# Patient Record
Sex: Female | Born: 1965
Health system: Southern US, Community
[De-identification: ages and names within clinical notes are randomized; demographics above are authoritative.]

## PROBLEM LIST (undated history)

## (undated) DIAGNOSIS — E119 Type 2 diabetes mellitus without complications: Secondary | ICD-10-CM

## (undated) DIAGNOSIS — E559 Vitamin D deficiency, unspecified: Secondary | ICD-10-CM

## (undated) DIAGNOSIS — R011 Cardiac murmur, unspecified: Secondary | ICD-10-CM

## (undated) DIAGNOSIS — I10 Essential (primary) hypertension: Secondary | ICD-10-CM

## (undated) HISTORY — PX: ABLATION: SHX5711

## (undated) HISTORY — PX: COSMETIC SURGERY: SHX468

## (undated) HISTORY — DX: Type 2 diabetes mellitus without complications: E11.9

## (undated) HISTORY — PX: ASD REPAIR: SHX258

## (undated) HISTORY — DX: Vitamin D deficiency, unspecified: E55.9

## (undated) HISTORY — DX: Essential (primary) hypertension: I10

## (undated) HISTORY — PX: TUBAL LIGATION: SHX77

## (undated) HISTORY — DX: Cardiac murmur, unspecified: R01.1

---

## 1998-02-23 ENCOUNTER — Ambulatory Visit (HOSPITAL_COMMUNITY): Admission: RE | Admit: 1998-02-23 | Discharge: 1998-02-23 | Payer: Self-pay | Admitting: Obstetrics and Gynecology

## 1999-01-23 ENCOUNTER — Ambulatory Visit (HOSPITAL_COMMUNITY): Admission: RE | Admit: 1999-01-23 | Discharge: 1999-01-23 | Payer: Self-pay | Admitting: Obstetrics

## 2000-04-04 ENCOUNTER — Emergency Department (HOSPITAL_COMMUNITY): Admission: EM | Admit: 2000-04-04 | Discharge: 2000-04-04 | Payer: Self-pay

## 2000-04-17 ENCOUNTER — Ambulatory Visit (HOSPITAL_COMMUNITY): Admission: RE | Admit: 2000-04-17 | Discharge: 2000-04-17 | Payer: Self-pay | Admitting: Obstetrics and Gynecology

## 2000-04-17 ENCOUNTER — Encounter: Payer: Self-pay | Admitting: Obstetrics and Gynecology

## 2003-12-28 ENCOUNTER — Ambulatory Visit (HOSPITAL_COMMUNITY): Admission: RE | Admit: 2003-12-28 | Discharge: 2003-12-28 | Payer: Self-pay | Admitting: Obstetrics and Gynecology

## 2004-01-25 ENCOUNTER — Ambulatory Visit (HOSPITAL_BASED_OUTPATIENT_CLINIC_OR_DEPARTMENT_OTHER): Admission: RE | Admit: 2004-01-25 | Discharge: 2004-01-25 | Payer: Self-pay | Admitting: Obstetrics and Gynecology

## 2004-01-25 ENCOUNTER — Ambulatory Visit (HOSPITAL_COMMUNITY): Admission: RE | Admit: 2004-01-25 | Discharge: 2004-01-25 | Payer: Self-pay | Admitting: Obstetrics and Gynecology

## 2004-02-08 ENCOUNTER — Emergency Department (HOSPITAL_COMMUNITY): Admission: EM | Admit: 2004-02-08 | Discharge: 2004-02-08 | Payer: Self-pay | Admitting: Emergency Medicine

## 2006-06-06 ENCOUNTER — Other Ambulatory Visit: Admission: RE | Admit: 2006-06-06 | Discharge: 2006-06-06 | Payer: Self-pay | Admitting: Obstetrics and Gynecology

## 2006-10-07 ENCOUNTER — Encounter: Admission: RE | Admit: 2006-10-07 | Discharge: 2006-10-07 | Payer: Self-pay | Admitting: Obstetrics and Gynecology

## 2007-11-03 ENCOUNTER — Ambulatory Visit (HOSPITAL_COMMUNITY): Admission: RE | Admit: 2007-11-03 | Discharge: 2007-11-03 | Payer: Self-pay | Admitting: Obstetrics and Gynecology

## 2009-07-16 ENCOUNTER — Emergency Department (HOSPITAL_COMMUNITY): Admission: EM | Admit: 2009-07-16 | Discharge: 2009-07-16 | Payer: Self-pay | Admitting: Family Medicine

## 2009-07-16 ENCOUNTER — Emergency Department (HOSPITAL_COMMUNITY): Admission: EM | Admit: 2009-07-16 | Discharge: 2009-07-16 | Payer: Self-pay | Admitting: Emergency Medicine

## 2010-01-25 ENCOUNTER — Encounter: Admission: RE | Admit: 2010-01-25 | Discharge: 2010-01-25 | Payer: Self-pay | Admitting: Endocrinology

## 2010-09-23 ENCOUNTER — Encounter: Payer: Self-pay | Admitting: Obstetrics and Gynecology

## 2010-09-24 ENCOUNTER — Encounter: Payer: Self-pay | Admitting: Endocrinology

## 2010-12-06 LAB — POCT URINALYSIS DIP (DEVICE)
Glucose, UA: 500 mg/dL — AB
Specific Gravity, Urine: 1.01 (ref 1.005–1.030)
pH: 5.5 (ref 5.0–8.0)

## 2010-12-06 LAB — BASIC METABOLIC PANEL
BUN: 8 mg/dL (ref 6–23)
CO2: 27 mEq/L (ref 19–32)
Calcium: 8.8 mg/dL (ref 8.4–10.5)
Chloride: 107 mEq/L (ref 96–112)
Creatinine, Ser: 0.55 mg/dL (ref 0.4–1.2)

## 2010-12-06 LAB — URINE CULTURE

## 2011-01-19 NOTE — Op Note (Signed)
Maria Conley, SKORUPSKI                         ACCOUNT NO.:  [id number]   MEDICAL RECORD NO.:  [id number]                   PATIENT TYPE:  AMB   LOCATION:  [facility]                                 FACILITY:  [facility]   PHYSICIAN:  Katherine Roan, M.D.               DATE OF BIRTH:  08-09-1966   DATE OF PROCEDURE:  01/25/2004  DATE OF DISCHARGE:                                 OPERATIVE REPORT   PREOPERATIVE DIAGNOSES:  Menorrhagia with anemia.   POSTOPERATIVE DIAGNOSES:  Menorrhagia with anemia.   OPERATION:  Hysteroscopy with hydrothermal ablation.   DESCRIPTION OF PROCEDURE:  The patient was placed in lithotomy position.  Pelvic exam under anesthesia was accomplished.  Uterus was normal size in  the mid plane without adnexal masses, was freely mobile.  Cervix was then  grasped, dilated to a #21 [place], and the hysteroscope was inserted into the  uterus.  The endouterine cavity appeared to be quite normal.  There was 1  small polyp.  Then the hydrothermal ablation was done without difficulty.  Postop pictures were obtained.  No unusual blood loss occurred.  The patient  tolerated the procedure well, was sent to the recovery room in good  condition.                                               Katherine Roan, M.D.    SDM/MEDQ  D:  01/25/2004  T:  01/25/2004  Job:  [id number]

## 2012-11-07 ENCOUNTER — Institutional Professional Consult (permissible substitution): Payer: Self-pay | Admitting: Cardiology

## 2012-11-10 ENCOUNTER — Ambulatory Visit (INDEPENDENT_AMBULATORY_CARE_PROVIDER_SITE_OTHER): Payer: 59 | Admitting: Cardiology

## 2012-11-10 ENCOUNTER — Encounter: Payer: Self-pay | Admitting: Cardiology

## 2012-11-10 VITALS — BP 128/76 | HR 82 | Ht 65.0 in | Wt 197.6 lb

## 2012-11-10 DIAGNOSIS — Z9889 Other specified postprocedural states: Secondary | ICD-10-CM

## 2012-11-10 DIAGNOSIS — Z8774 Personal history of (corrected) congenital malformations of heart and circulatory system: Secondary | ICD-10-CM

## 2012-11-10 DIAGNOSIS — R079 Chest pain, unspecified: Secondary | ICD-10-CM

## 2012-11-10 DIAGNOSIS — E785 Hyperlipidemia, unspecified: Secondary | ICD-10-CM

## 2012-11-10 DIAGNOSIS — I119 Hypertensive heart disease without heart failure: Secondary | ICD-10-CM

## 2012-11-10 DIAGNOSIS — E109 Type 1 diabetes mellitus without complications: Secondary | ICD-10-CM

## 2012-11-10 DIAGNOSIS — E119 Type 2 diabetes mellitus without complications: Secondary | ICD-10-CM

## 2012-11-10 DIAGNOSIS — I10 Essential (primary) hypertension: Secondary | ICD-10-CM

## 2012-11-10 DIAGNOSIS — Z720 Tobacco use: Secondary | ICD-10-CM

## 2012-11-10 DIAGNOSIS — F17200 Nicotine dependence, unspecified, uncomplicated: Secondary | ICD-10-CM

## 2012-11-10 NOTE — Progress Notes (Addendum)
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  She is seen at the request of Paulene Floor NP of [facility]   family practice.  She is seen for evaluation of chest discomfort.  She has an interesting past medical history.  She was found to have a heart murmur on her kindergarten physical exam and it turned out to be an atrial septal defect which was repaired at age 32 at [facility].  At age 66 she became an insulin dependent type I diabetic.  She has been using an insulin pump since 1992 at age 29.  She has had good diabetic control over the years and is a former patient of Dr. Dagoberto Ligas and is now followed by Paulene Floor NP.  The patient states that her hemoglobin A1c runs about 7.  The patient has had a history of dyslipidemia and has been on statin therapy most recently Lipitor 40 mg daily.  The patient smokes cigarettes occasionally. She was in her usual state of health until several months ago when she began to note some intermittent chest pressure.  There was no pattern to it and it could occur at rest or with exertion.  At times she would notice radiation down the left arm which alarmed her.  She is a Engineer, civil (consulting).  The discomfort would normally pass on its own but she was also taken aspirin.  The patient has not had any symptoms of congestive heart failure.  She walks for exercise.  She had her last chest x-ray 01/25/10 which showed normal heart size and clear lungs and the old sternotomy incision from her ASD repair.  Current Outpatient Prescriptions  Medication Sig Dispense Refill  . aspirin 325 MG tablet Take 325 mg by mouth daily.      Marland Kitchen atorvastatin (LIPITOR) 40 MG tablet Take 40 mg by mouth daily.      . Cholecalciferol (VITAMIN D-3) 5000 UNITS TABS  Take by mouth.      . Insulin Human (INSULIN PUMP) 100 unit/ml SOLN Inject into the skin. Humalog basal 1.0 unit  Bolus 1.5 unit per 15 gram/carb      . lisinopril-hydrochlorothiazide (PRINZIDE,ZESTORETIC) 20-12.5 MG per tablet Take 1 tablet by mouth daily.       No current facility-administered medications for this visit.    No Known Allergies  Patient Active Problem List  Diagnosis  . Chest pain    History  Smoking status  . Current Some Day Smoker  Smokeless tobacco  . Not on file    History  Alcohol Use: Not on file    Family history reveals her father died of a heart attack in his 55s but had had 2 previous open heart operations for mitral stenosis secondary to childhood rheumatic fever.  Patient's mother has had coronary artery bypass graft surgery but is doing well and is alive at age 19.  Review of Systems: Constitutional: no fever chills diaphoresis or fatigue or change in weight.  Head and neck: no hearing loss, no epistaxis, no photophobia or visual disturbance. Respiratory: No cough, shortness of breath or wheezing. Cardiovascular: No  peripheral edema, palpitations.  Positive for chest pain Gastrointestinal: No abdominal distention, no abdominal pain, no change in bowel habits hematochezia or  melena. Genitourinary: No dysuria, no frequency, no urgency, no nocturia. Musculoskeletal:No arthralgias, no back pain, no gait disturbance or myalgias. Neurological: No dizziness, no headaches, no numbness, no seizures, no syncope, no weakness, no tremors. Hematologic: No lymphadenopathy, no easy bruising. Psychiatric: No confusion, no hallucinations, no sleep disturbance.    Physical Exam: Filed Vitals:   11/10/12 1553  BP: 128/76  Pulse: 82   the general appearance reveals a well-developed well-nourished woman in no distress.The head and neck exam reveals pupils equal and reactive.  Extraocular movements are full.  There is no scleral icterus.  The mouth and pharynx  are normal.  The neck is supple.  The carotids reveal no bruits.  The jugular venous pressure is normal.  The  thyroid is not enlarged.  There is no lymphadenopathy.  The chest is clear to percussion and auscultation.  There is a well-healed midline sternotomy scar.  There are no rales or rhonchi.  Expansion of the chest is symmetrical.  The precordium is quiet.  The first heart sound is normal.  The second heart sound is difficult to hear.  There is no  gallop rub or click.There is a grade2/6 systolic murmur along LSE.  There is no abnormal lift or heave.  The abdomen is soft and nontender.  The bowel sounds are normal.  The liver and spleen are not enlarged.  There are no abdominal masses.  There are no abdominal bruits.  Extremities reveal good pedal pulses.  There is no phlebitis or edema.  There is no cyanosis or clubbing.  Strength is normal and symmetrical in all extremities.  There is no lateralizing weakness.  There are no sensory deficits.  The skin is warm and dry.  There is no rash.  EKG shows normal sinus rhythm and is within normal limits   Assessment / Plan: Chest pain both resting and with exertion, concerning for possible myocardial ischemia.  There is occasional left arm radiation.  Patient has multiple risk factors including family history of CABG in her mother, sudden death in her father in his 25s, current tobacco usage, history of hypertension, history of type 1 diabetes on an insulin pump, and history of hypercholesterolemia.  She also has past history of open heart surgery with ASD repair at age 4 We will have the patient return for a treadmill Myoview stress test to evaluate her symptoms further.  In the meantime she will continue same medication including daily aspirin and we also gave her some nitroglycerin 0.4 mg tablets to have on hand for when necessary use.  Many thanks for the opportunity to see this pleasant woman with you.  I will be in touch with you regarding the results  of her treadmill Myoview stress test.

## 2012-11-10 NOTE — Patient Instructions (Signed)
Your physician has requested that you have en exercise stress myoview. For further information please visit [email or web address]. Please follow instruction sheet, as given.   Your physician recommends that you continue on your current medications as directed. Please refer to the Current Medication list given to you today. 

## 2012-11-12 ENCOUNTER — Other Ambulatory Visit: Payer: Self-pay | Admitting: *Deleted

## 2012-11-12 ENCOUNTER — Telehealth: Payer: Self-pay | Admitting: Cardiology

## 2012-11-12 MED ORDER — NITROGLYCERIN 0.4 MG SL SUBL: 0.4000 mg | SUBLINGUAL_TABLET | SUBLINGUAL | Status: DC | PRN

## 2012-11-12 NOTE — Telephone Encounter (Signed)
New Problem:    Patient called in because per her last OV Dr. Patty Sermons was supposed to call in a prescription of nitroglycerin but her pharmacy stats they have not received a request.  Please call back if you have any questions.

## 2012-11-12 NOTE — Telephone Encounter (Signed)
Ntg sent to pharmacy per pt request.

## 2012-11-18 ENCOUNTER — Encounter (HOSPITAL_COMMUNITY): Payer: 59

## 2012-11-18 ENCOUNTER — Ambulatory Visit (HOSPITAL_COMMUNITY): Payer: 59 | Attending: Cardiology | Admitting: Radiology

## 2012-11-18 VITALS — BP 121/68 | Ht 65.0 in | Wt 196.0 lb

## 2012-11-18 DIAGNOSIS — R079 Chest pain, unspecified: Secondary | ICD-10-CM

## 2012-11-18 DIAGNOSIS — R0602 Shortness of breath: Secondary | ICD-10-CM

## 2012-11-18 DIAGNOSIS — R0789 Other chest pain: Secondary | ICD-10-CM | POA: Insufficient documentation

## 2012-11-18 DIAGNOSIS — E119 Type 2 diabetes mellitus without complications: Secondary | ICD-10-CM | POA: Insufficient documentation

## 2012-11-18 DIAGNOSIS — R002 Palpitations: Secondary | ICD-10-CM | POA: Insufficient documentation

## 2012-11-18 DIAGNOSIS — F17200 Nicotine dependence, unspecified, uncomplicated: Secondary | ICD-10-CM | POA: Insufficient documentation

## 2012-11-18 DIAGNOSIS — I10 Essential (primary) hypertension: Secondary | ICD-10-CM | POA: Insufficient documentation

## 2012-11-18 MED ORDER — TECHNETIUM TC 99M SESTAMIBI GENERIC - CARDIOLITE
10.0000 | Freq: Once | INTRAVENOUS | Status: AC | PRN
  Administered 2012-11-18: 10 via INTRAVENOUS

## 2012-11-18 MED ORDER — REGADENOSON 0.4 MG/5ML IV SOLN
0.4000 mg | Freq: Once | INTRAVENOUS | Status: AC
  Administered 2012-11-18: 0.4 mg via INTRAVENOUS

## 2012-11-18 MED ORDER — TECHNETIUM TC 99M SESTAMIBI GENERIC - CARDIOLITE
30.0000 | Freq: Once | INTRAVENOUS | Status: AC | PRN
  Administered 2012-11-18: 30 via INTRAVENOUS

## 2012-11-18 NOTE — Progress Notes (Signed)
  [facility] SITE 3 NUCLEAR MED [address] [place], [place] [address] [phone]    Cardiology Nuclear Med Study  Maria Conley is a 47 y.o. female     MRN : [id number]     DOB: 1966/09/03  Procedure Date: 11/18/2012  Nuclear Med Background Indication for Stress Test:  Evaluation for Ischemia History:  1997 ASD repair at [facility] Cardiac Risk Factors: Family History - CAD, Hypertension, IDDM Type 1, Lipids and Smoker  Symptoms:  Chest Pain/Pressure at Rest and with Exertion (last date of chest discomfort 2 days ago), Fatigue and Palpitations   Nuclear Pre-Procedure Caffeine/Decaff Intake:  None NPO After: 8:00am   Lungs:  clear IV 0.9% NS with Angio Cath:  20g  IV Site: R Hand  IV Started by:  Cathlyn Parsons, RN  Chest Size (in):  36 Cup Size: C  Height: 5\' 5"  (1.651 m)  Weight:  196 lb (88.905 kg)  BMI:  Body mass index is 32.62 kg/(m^2). Tech Comments: n/a    Nuclear Med Study 1 or 2 day study: 1 day  Stress Test Type:  Treadmill/Lexiscan  Reading MD: Cassell Clement, MD  Order Authorizing Provider:  Villa Herb  Resting Radionuclide: Technetium 99m Sestamibi  Resting Radionuclide Dose: 11.0 mCi   Stress Radionuclide:  Technetium 52m Sestamibi  Stress Radionuclide Dose: 33.0 mCi           Stress Protocol Rest HR: 80 Stress HR: 148  Rest BP: 121/68 Stress BP: 164/76  Exercise Time (min): 7:31 METS: 7.00   Predicted Max HR: 172 bpm % Max HR: 86.05 bpm Rate Pressure Product: 21308   Dose of Adenosine (mg):  n/a Dose of Lexiscan: 0.4 mg  Dose of Atropine (mg): n/a Dose of Dobutamine: n/a mcg/kg/min (at max HR)  Stress Test Technologist: Cathlyn Parsons, RN  Nuclear Technologist:  Domenic Polite, CNMT     Rest Procedure:  Myocardial perfusion imaging was performed at rest 45 minutes following the intravenous administration of Technetium 29m Sestamibi. Rest ECG: NSR - Normal EKG  Stress Procedure:  The patient exercised on the  treadmill utilizing the Bruce Protocol for 5:30 minutes. The patient converted to Lexiscan due to severe SOB .  Patient received IV Lexiscan 0.4 mg over 15 seconds with low level exercise.Technetium 51m Sestamibi was injected at 30 seconds. Treadmill stopped at 6:30 due to severe dyspnea. Patient had no chest pain with infusion. Myocardial perfusion imaging was performed after a 45 minute delay. Stress ECG: No significant change from baseline ECG  QPS Raw Data Images:  Normal; no motion artifact; normal heart/lung ratio. Stress Images:  Normal homogeneous uptake in all areas of the myocardium. Rest Images:  Normal homogeneous uptake in all areas of the myocardium. Subtraction (SDS):  No evidence of ischemia. Transient Ischemic Dilatation (Normal <1.22):  0.94 Lung/Heart Ratio (Normal <0.45):  0.33  Quantitative Gated Spect Images QGS EDV:  76 ml QGS ESV:  15 ml  Impression Exercise Capacity:  Lexiscan with low level exercise. BP Response:  Normal blood pressure response. Clinical Symptoms:  No chest pain. ECG Impression:  No significant ST segment change suggestive of ischemia. Comparison with Prior Nuclear Study: No images to compare  Overall Impression:  Normal stress nuclear study.  LV Ejection Fraction: 80%.  LV Wall Motion:  NL LV Function; NL Wall Motion  [employer]

## 2012-11-21 ENCOUNTER — Other Ambulatory Visit: Payer: Self-pay | Admitting: Nurse Practitioner

## 2012-11-24 ENCOUNTER — Other Ambulatory Visit (HOSPITAL_COMMUNITY): Payer: Self-pay | Admitting: Obstetrics and Gynecology

## 2012-11-24 ENCOUNTER — Telehealth: Payer: Self-pay | Admitting: Cardiology

## 2012-11-24 DIAGNOSIS — Z1231 Encounter for screening mammogram for malignant neoplasm of breast: Secondary | ICD-10-CM

## 2012-11-24 NOTE — Telephone Encounter (Signed)
New Prob   Pt calling in returning phone call from last week.

## 2012-11-24 NOTE — Telephone Encounter (Signed)
Message copied by Burnell Blanks on Mon Nov 24, 2012  8:56 AM ------      Message from: Cassell Clement      Created: Wed Nov 19, 2012 12:31 PM       Please report.  The stress test was normal.  No evidence of ischemia.  Ejection fraction was normal at 80%. ------

## 2012-11-24 NOTE — Telephone Encounter (Signed)
Advised patient of results.  

## 2012-11-27 ENCOUNTER — Ambulatory Visit (HOSPITAL_COMMUNITY)
Admission: RE | Admit: 2012-11-27 | Discharge: 2012-11-27 | Disposition: A | Discharge status: Home / Self Care | Payer: 59 | Source: Ambulatory Visit | Attending: Obstetrics and Gynecology | Admitting: Obstetrics and Gynecology

## 2012-11-27 DIAGNOSIS — Z1231 Encounter for screening mammogram for malignant neoplasm of breast: Secondary | ICD-10-CM | POA: Insufficient documentation

## 2013-01-01 ENCOUNTER — Other Ambulatory Visit: Payer: Self-pay | Admitting: Nurse Practitioner

## 2013-01-21 ENCOUNTER — Encounter: Payer: Self-pay | Admitting: Family Medicine

## 2013-01-21 ENCOUNTER — Ambulatory Visit (INDEPENDENT_AMBULATORY_CARE_PROVIDER_SITE_OTHER): Payer: 59 | Admitting: Family Medicine

## 2013-01-21 VITALS — BP 120/76 | HR 92 | Temp 99.7°F | Ht 65.0 in | Wt 191.0 lb

## 2013-01-21 DIAGNOSIS — M549 Dorsalgia, unspecified: Secondary | ICD-10-CM

## 2013-01-21 MED ORDER — KETOROLAC TROMETHAMINE 60 MG/2ML IM SOLN
30.0000 mg | Freq: Once | INTRAMUSCULAR | Status: AC
  Administered 2013-01-21: 30 mg via INTRAMUSCULAR

## 2013-01-21 MED ORDER — PREDNISONE 50 MG PO TABS: ORAL_TABLET | ORAL | Status: DC

## 2013-01-21 MED ORDER — CYCLOBENZAPRINE HCL 5 MG PO TABS: 5.0000 mg | ORAL_TABLET | Freq: Three times a day (TID) | ORAL | Status: DC | PRN

## 2013-01-21 MED ORDER — MELOXICAM 15 MG PO TABS: 15.0000 mg | ORAL_TABLET | Freq: Every day | ORAL | Status: DC

## 2013-01-21 NOTE — Progress Notes (Signed)
  Subjective:    Patient ID: Maria Conley, female    DOB: 10-09-1965, 47 y.o.   MRN: [id number]  HPI Back pain x 2-3 weeks Has had episodes of back spasms in the past.  Has had formal work up including CTs to r/o kidney stones that have been negative.  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  Initially noticed LBP after working Has persisted since this point. Predominantly R sided.  No radiation/radicular sxs.  No dysuria.  Pain worse with flexion/extension and prolonged standing.  Baseline type I diabetic with insulin pump.  Blood sugars well controlled.  Can uptitrate regimen if necessary. Has done this in the past with prednisone use.    Review of Systems  All other systems reviewed and are negative.       Objective:   Physical Exam  Constitutional: She appears well-developed and well-nourished.  HENT:  Head: Normocephalic.  Eyes: Conjunctivae are normal. Pupils are equal, round, and reactive to light.  Neck: Normal range of motion.  Cardiovascular: Normal rate, regular rhythm and normal heart sounds.   Pulmonary/Chest: Effort normal.  Abdominal: Soft.  Musculoskeletal:       Arms: Neurovascularly intact distally    Neurological: She is alert.  Skin: Skin is warm.          Assessment & Plan:  Toradol 30mg  IM x 1  Will place on course of prednisone, mobic and flexeril  Discussed GI, blood sugar, blood pressure, renal  red flags.  Physical therapy referral as this has become a recurrent issue.  Follow up as needed.     The patient and/or caregiver has been counseled thoroughly with regard to treatment plan and/or medications prescribed including dosage, schedule, interactions, rationale for use, and possible side effects and they verbalize understanding. Diagnoses and expected course of recovery discussed and will return if not improved as expected or if the condition worsens. Patient and/or caregiver verbalized understanding.

## 2013-01-21 NOTE — Patient Instructions (Addendum)
Ketorolac injection What is this medicine? KETOROLAC (kee toe ROLE ak) is a non-steroidal anti-inflammatory drug (NSAID). It is used to treat moderate to severe pain for up to 5 days. It is commonly used after surgery. This medicine should not be used for more than 5 days. This medicine may be used for other purposes; ask your health care provider or pharmacist if you have questions. What should I tell my health care provider before I take this medicine? They need to know if you have any of these conditions: -asthma, especially aspirin-sensitive asthma -bleeding problems -kidney disease -stomach bleed, ulcer, or other problem -taking aspirin, other NSAID, or probenecid -an unusual or allergic reaction to ketorolac, tromethamine, aspirin, other NSAIDs, other medicines, foods, dyes or preservatives -pregnant or trying to get pregnant -breast-feeding How should I use this medicine? This medicine is for injection into a muscle or into a vein. It is given by a health care professional in a hospital or clinic setting. Talk to your pediatrician regarding the use of this medicine in children. While this drug may be prescribed for children as young as 2 years old for selected conditions, precautions do apply. Patients over 65 years old may have a stronger reaction and need a smaller dose. Overdosage: If you think you have taken too much of this medicine contact a poison control center or emergency room at once. NOTE: This medicine is only for you. Do not share this medicine with others. What if I miss a dose? This does not apply. What may interact with this medicine? Do not take this medicine with any of the following medications: -aspirin and aspirin-like medicines -cidofovir -methotrexate -NSAIDs, medicines for pain and inflammation, like ibuprofen or naproxen -pentoxifylline -probenecid This medicine may also interact with the following  medications: -alcohol -alendronate -alprazolam -carbamazepine -diuretics -flavocoxid -fluoxetine -ginkgo -lithium -medicines for blood pressure like enalapril -medicines that affect platelets like pentoxifylline -medicines that treat or prevent blood clots like heparin, warfarin -muscle relaxants -pemetrexed -phenytoin -thiothixene This list may not describe all possible interactions. Give your health care provider a list of all the medicines, herbs, non-prescription drugs, or dietary supplements you use. Also tell them if you smoke, drink alcohol, or use illegal drugs. Some items may interact with your medicine. What should I watch for while using this medicine? Tell your doctor or healthcare professional if your symptoms do not start to get better or if they get worse. This medicine does not prevent heart attack or stroke. In fact, this medicine may increase the chance of a heart attack or stroke. The chance may increase with longer use of this medicine and in people who have heart disease. If you take aspirin to prevent heart attack or stroke, talk with your doctor or health care professional. Do not take medicines such as ibuprofen and naproxen with this medicine. Side effects such as stomach upset, nausea, or ulcers may be more likely to occur. Many medicines available without a prescription should not be taken with this medicine. This medicine can cause ulcers and bleeding in the stomach and intestines at any time during treatment. Do not smoke cigarettes or drink alcohol. These increase irritation to your stomach and can make it more susceptible to damage from this medicine. Ulcers and bleeding can happen without warning symptoms and can cause death. This medicine can cause you to bleed more easily. Try to avoid damage to your teeth and gums when you brush or floss your teeth. What side effects may I notice from receiving   this medicine? Side effects that you should report to your  doctor or health care professional as soon as possible: -allergic reactions like skin rash, itching or hives, swelling of the face, lips, or tongue -black or tarry stools -breathing problems -changes in vision -chest pain -high blood pressure -nausea, vomiting -redness, blistering, peeling or loosening of the skin, including inside the mouth -severe abdominal pain -slurred speech or weakness on one side of the body -trouble passing urine or change in the amount of urine -unexplained weight gain or swelling -unusual bleeding or bruising -unusually weak or tired -yellowing of eyes or skin Side effects that usually do not require medical attention (report to your doctor or health care professional if they continue or are bothersome): -diarrhea -dizziness -headache -heartburn This list may not describe all possible side effects. Call your doctor for medical advice about side effects. You may report side effects to FDA at 1-800-FDA-1088. Where should I keep my medicine? This drug is given in a hospital or clinic and will not be stored at home. NOTE: This sheet is a summary. It may not cover all possible information. If you have questions about this medicine, talk to your doctor, pharmacist, or health care provider.  2012, Elsevier/Gold Standard. (01/08/2008 5:24:50 PM)

## 2013-02-03 ENCOUNTER — Ambulatory Visit: Payer: 59 | Admitting: Physical Therapy

## 2013-02-04 ENCOUNTER — Telehealth: Payer: Self-pay | Admitting: Family Medicine

## 2013-02-04 ENCOUNTER — Telehealth: Payer: Self-pay | Admitting: Nurse Practitioner

## 2013-02-04 MED ORDER — "PARADIGM QUICK-SET 23"" 6MM MISC": Status: AC

## 2013-02-04 NOTE — Telephone Encounter (Signed)
Need to know what size resevoir she uses and length of tubing

## 2013-02-04 NOTE — Telephone Encounter (Signed)
Called in infusion set to [facility] and patient called.  She is made aware that she needs appt for f/u diabetes.  At time that I called patient was unable to make appt because she was at work . She said she would call back for appt.

## 2013-02-05 NOTE — Telephone Encounter (Signed)
Done by nurse 

## 2013-02-05 NOTE — Telephone Encounter (Signed)
DONE

## 2013-02-05 NOTE — Telephone Encounter (Signed)
SENDS TO [facility]-  23 INCHES  OF TUBING NO RES. NEEDED

## 2013-02-06 ENCOUNTER — Telehealth: Payer: Self-pay | Admitting: *Deleted

## 2013-02-06 NOTE — Telephone Encounter (Signed)
Need to know pump brand and what she needs

## 2013-02-06 NOTE — Telephone Encounter (Signed)
Patient needs Diabetic pump supplies. Maria Conley needs a rx sent for Diabetic pump supplies. Please send.

## 2013-02-09 NOTE — Telephone Encounter (Signed)
This was taken care of 6/4

## 2013-02-25 ENCOUNTER — Telehealth: Payer: Self-pay | Admitting: Pharmacist

## 2013-02-25 NOTE — Telephone Encounter (Signed)
We called in rx for tubing to [facility] pharmacy but they do not accept patient's flex spending plan.   Left patient some samples of pump tubing to use until she gets appt with [facility].  She should then be able to get pump supplies through [facility] at no charge.

## 2013-03-11 ENCOUNTER — Other Ambulatory Visit: Payer: Self-pay | Admitting: *Deleted

## 2013-03-11 MED ORDER — ATORVASTATIN CALCIUM 40 MG PO TABS: 40.0000 mg | ORAL_TABLET | Freq: Every day | ORAL | Status: DC

## 2013-03-16 ENCOUNTER — Ambulatory Visit: Payer: 59

## 2013-03-30 ENCOUNTER — Ambulatory Visit (INDEPENDENT_AMBULATORY_CARE_PROVIDER_SITE_OTHER): Payer: 59 | Admitting: Pharmacist

## 2013-03-30 ENCOUNTER — Encounter: Payer: Self-pay | Admitting: Pharmacist

## 2013-03-30 VITALS — BP 122/78 | HR 74 | Ht 66.5 in | Wt 196.0 lb

## 2013-03-30 DIAGNOSIS — E109 Type 1 diabetes mellitus without complications: Secondary | ICD-10-CM

## 2013-03-30 DIAGNOSIS — I119 Hypertensive heart disease without heart failure: Secondary | ICD-10-CM

## 2013-03-30 NOTE — Progress Notes (Signed)
Diabetes Follow-Up Visit Chief Complaint:   Chief Complaint  Patient presents with  . Diabetes     Filed Vitals:   03/30/13 2108  BP: 122/78  Pulse: 74     HPI: Maria Conley has type 1 diabetes managed with insulin pump therapy.  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  She had her initial consult July 14th.  At that visit an A1c was check and per patient was 6.5% but I currently am unable to access those records.  She is scheduled for her nutrition class in August and after that visit will be able to get pump supplies, glucometer supplies and medications at no cost.   She does report that she does not have enough infusion sets to last until her August visit.  She also does not have any test strips to check BG.   Current Diabetes Medications:  Humalog insulin used in insulin pump  Exam Edema:  negative Polyuria:  negative  Polydipsia:  negative Polyphagia:  negative  BMI:  Body mass index is 31.16 kg/(m^2).   Weight changes:  stable General Appearance:  alert, oriented, no acute distress Mood/Affect:  normal   Low fat/carbohydrate diet?  Yes Nicotine Abuse?  No Medication Compliance?  Yes Exercise?  No Alcohol Abuse?  No  Home BG Monitoring:  Checking 0 times a day. Denies hypoglycemic or hyperglycemic events     Assessment: 1.  Diabetes.  Controlled but needs supplies 2.  Blood Pressure.  controlled   Recommendations: 1.  Medication recommendations at this time are as follows:  No medication changes recommended  I did give patient #10 infusion sets to get her though until [facility] appt 2.  Reviewed HBG goals:  Fasting 80-130 and 1-2 hour post prandial <180.  Patient is instructed to check BG up to 4 times per day.     Gave One Touch Verio glucometer and #30 test strips 3.  BP goal < 140/80. 4.  LDL goal of < 100, HDL > 40 and  TG < 150 - labs needed at next appt    Time spent counseling patient:  25 minutes

## 2013-04-13 ENCOUNTER — Other Ambulatory Visit: Payer: Self-pay | Admitting: *Deleted

## 2013-04-13 MED ORDER — LISINOPRIL-HYDROCHLOROTHIAZIDE 20-12.5 MG PO TABS: 1.0000 | ORAL_TABLET | Freq: Every day | ORAL | Status: DC

## 2013-04-15 ENCOUNTER — Ambulatory Visit: Payer: 59 | Admitting: *Deleted

## 2013-05-13 ENCOUNTER — Other Ambulatory Visit: Payer: Self-pay | Admitting: Nurse Practitioner

## 2013-05-14 ENCOUNTER — Other Ambulatory Visit: Payer: Self-pay | Admitting: Family Medicine

## 2013-05-14 ENCOUNTER — Telehealth: Payer: Self-pay | Admitting: Pharmacist

## 2013-05-14 ENCOUNTER — Encounter: Payer: Self-pay | Admitting: *Deleted

## 2013-05-14 ENCOUNTER — Encounter: Payer: 59 | Attending: Family Medicine | Admitting: *Deleted

## 2013-05-14 VITALS — Ht 65.0 in | Wt 195.1 lb

## 2013-05-14 DIAGNOSIS — E109 Type 1 diabetes mellitus without complications: Secondary | ICD-10-CM | POA: Insufficient documentation

## 2013-05-14 DIAGNOSIS — Z713 Dietary counseling and surveillance: Secondary | ICD-10-CM | POA: Insufficient documentation

## 2013-05-14 MED ORDER — INSULIN LISPRO 100 UNIT/ML ~~LOC~~ SOLN: SUBCUTANEOUS | Status: DC

## 2013-05-14 NOTE — Progress Notes (Signed)
  Pump Progress Note  Appointment Start Time: 0800 End Time: 0930  Assessment: Patient here as [facility] patient for pump use review and diabetes education as needed. She is currently using a Medtronic insuiln pump that is out of warranty, but would like to wait until January 2015 to pursue newer pump when benefits start over. She does not know how interested she would be in sensor technology. She states she has hypoglycemia about twice a week, and her current A1c is 6.5%. She does not typically use the Bolus Wizard, but does bolus for food and correction BG manually. She states she is familiar with Temp Basal feature and uses it if she needs to fast for blood work or other reasons her meals might be delayed.   Intervention: Acknowledged her good control of diabetes with excellent A1c of 6.5% with infrequent low BG reported. Discussed rationale of using Bolus Wizard as a more objective way to provide accurate insulin for meals and for corrections. Reviewed Carb Counting in terms of food groups compared to counting grams and the use of Exchange on the pump to relate to Carb Choice of 15 grams. Also discussed plan for [facility] to start a DM1 Support Group in the near future which she may find beneficial.  Plan:  Consider greater use of Bolus Wizard vs. Manual Boluses. Continue with good control of diabetes, great job!  Follow up: Patient is doing well now. She may call for follow up visit as needed in the future.

## 2013-05-14 NOTE — Telephone Encounter (Signed)
rx sent to [facility].

## 2013-05-22 NOTE — Patient Instructions (Signed)
Plan:  Consider greater use of Bolus Wizard vs. Manual Boluses. Continue with good control of diabetes, great job!

## 2013-06-18 ENCOUNTER — Ambulatory Visit (INDEPENDENT_AMBULATORY_CARE_PROVIDER_SITE_OTHER): Payer: 59 | Admitting: Family Medicine

## 2013-06-18 VITALS — BP 132/73 | HR 90 | Wt 195.0 lb

## 2013-06-18 DIAGNOSIS — E119 Type 2 diabetes mellitus without complications: Secondary | ICD-10-CM

## 2013-06-18 NOTE — Progress Notes (Signed)
Patient presents for 3 mo f/u DM as part of the employee sponsored Link to [employer]. Medications have been reviewed. I have also discussed with patient lifestyle interventions such as diet and exercise. Full documentation of this visit can be found in the caretracker documenting system through [facility]). However specifics of this visit include the following:  POC A1C 6.5, at goal. Patient hasn't tested blood sugar in 3 days (doesn't have strips). She is out of pump supplies (medtronic has not sent yet but they are supposed to be on there way). She has been doing regular insulin q 2 h x 2 days in the meantime.  plan 1.) I wll contact her doctor for an Rx for test strips 2.) Will f/u with patient to make sure she gets pump supplies in the next day or so 3.) will get a new pump in january-she has had her current pump for 6 years and she wants to have her meter and pump and sync 4.) counseled patient on myfitnesspal app and she will download it. Also told her about live life well through [place] and to go in their and log exercise. 5.) f/u visit in 3 months   Plan Items:  Nutrition Counseling  Cost Savings Intervention Outcomes: Supplies provided, proactive intervention  Patient has set a series of personal goals and will f/u in 3 mo for further review of DM.

## 2013-06-19 ENCOUNTER — Other Ambulatory Visit: Payer: Self-pay | Admitting: Nurse Practitioner

## 2013-06-19 MED ORDER — GLUCOSE BLOOD VI STRP: ORAL_STRIP | Status: DC

## 2013-07-08 ENCOUNTER — Ambulatory Visit (INDEPENDENT_AMBULATORY_CARE_PROVIDER_SITE_OTHER): Payer: 59 | Admitting: Nurse Practitioner

## 2013-07-08 VITALS — BP 116/79 | HR 78 | Temp 98.5°F | Ht 65.0 in | Wt 194.0 lb

## 2013-07-08 DIAGNOSIS — E785 Hyperlipidemia, unspecified: Secondary | ICD-10-CM | POA: Insufficient documentation

## 2013-07-08 DIAGNOSIS — E109 Type 1 diabetes mellitus without complications: Secondary | ICD-10-CM

## 2013-07-08 DIAGNOSIS — I119 Hypertensive heart disease without heart failure: Secondary | ICD-10-CM

## 2013-07-08 MED ORDER — INSULIN LISPRO 100 UNIT/ML ~~LOC~~ SOLN: SUBCUTANEOUS | Status: DC

## 2013-07-08 MED ORDER — LISINOPRIL-HYDROCHLOROTHIAZIDE 20-12.5 MG PO TABS: 1.0000 | ORAL_TABLET | Freq: Every day | ORAL | Status: DC

## 2013-07-08 MED ORDER — ATORVASTATIN CALCIUM 40 MG PO TABS: 40.0000 mg | ORAL_TABLET | Freq: Every day | ORAL | Status: DC

## 2013-07-08 NOTE — Progress Notes (Signed)
Subjective:    Patient ID: [name], female    DOB: 09-Mar-1966, 47 y.o.   MRN: [id number]  Diabetes She presents for her follow-up diabetic visit. She has type 1 diabetes mellitus. No MedicAlert identification noted. Her disease course has been stable. There are no hypoglycemic associated symptoms. Pertinent negatives for diabetes include no blurred vision, no chest pain, no fatigue, no polydipsia and no polyphagia. There are no hypoglycemic complications. Symptoms are stable. There are no diabetic complications. Pertinent negatives for diabetic complications include no peripheral neuropathy. Risk factors for coronary artery disease include diabetes mellitus, dyslipidemia, family history and tobacco exposure. Current diabetic treatment includes insulin pump. She is compliant with treatment all of the time. Her weight is stable. She is following a diabetic diet. Meal planning includes carbohydrate counting. She has not had a previous visit with a dietician. She participates in exercise daily. Her home blood glucose trend is fluctuating minimally. Her breakfast blood glucose is taken between 7-8 am. Her breakfast blood glucose range is generally <70 mg/dl. Her lunch blood glucose range is generally 110-130 mg/dl. Her dinner blood glucose range is generally 110-130 mg/dl. Her highest blood glucose is 110-130 mg/dl. An ACE inhibitor/angiotensin II receptor blocker is being taken. She does not see a podiatrist.Eye exam is not current (last one was 1 1/2).  Hypertension This is a chronic problem. The current episode started more than 1 year ago. The problem is unchanged. The problem is controlled. Pertinent negatives include no blurred vision, chest pain, orthopnea, peripheral edema or shortness of breath. Risk factors for coronary artery disease include smoking/tobacco exposure, diabetes mellitus, dyslipidemia and family history. Past treatments include ACE inhibitors and diuretics. The current treatment  provides significant improvement. There is no history of a thyroid problem. There is no history of sleep apnea.  Hyperlipidemia This is a chronic problem. The current episode started more than 1 year ago. The problem is controlled. Recent lipid tests were reviewed and are normal. Exacerbating diseases include diabetes. She has no history of liver disease or obesity. Factors aggravating her hyperlipidemia include smoking. Pertinent negatives include no chest pain or shortness of breath. Current antihyperlipidemic treatment includes statins. The current treatment provides moderate improvement of lipids. There are no compliance problems.  Risk factors for coronary artery disease include diabetes mellitus, dyslipidemia, family history and hypertension.      Review of Systems  Constitutional: Negative for fatigue.  Eyes: Negative for blurred vision.  Respiratory: Negative for shortness of breath.   Cardiovascular: Negative for chest pain and orthopnea.  Endocrine: Negative for polydipsia and polyphagia.  All other systems reviewed and are negative.       Objective:   Physical Exam  Vitals reviewed. Constitutional: She is oriented to person, place, and time. She appears well-developed and well-nourished.  HENT:  Head: Normocephalic.  Right Ear: External ear normal.  Left Ear: External ear normal.  Nose: Nose normal.  Mouth/Throat: Oropharynx is clear and moist.  Eyes: Pupils are equal, round, and reactive to light.  Neck: Normal range of motion. Neck supple.  Cardiovascular: Normal rate, regular rhythm, normal heart sounds and intact distal pulses.   No murmur heard. Pulmonary/Chest: Effort normal and breath sounds normal. No respiratory distress. She has no wheezes.  Abdominal: Soft. Bowel sounds are normal.  Musculoskeletal: Normal range of motion. She exhibits no edema and no tenderness.  Neurological: She is alert and oriented to person, place, and time. No cranial nerve deficit.   Skin: Skin is warm and dry.  Psychiatric: She has a normal mood and affect. Her behavior is normal. Judgment and thought content normal.     BP 116/79  Pulse 78  Temp(Src) 98.5 F (36.9 C) (Oral)  Ht 5\' 5"  (1.651 m)  Wt 194 lb (87.998 kg)  BMI 32.28 kg/m2  Results for orders placed in visit on 07/08/13  POCT GLYCOSYLATED HEMOGLOBIN (HGB A1C)      Result Value Range   Hemoglobin A1C 6.2%    POCT UA - MICROALBUMIN      Result Value Range   Microalbumin Ur, POC neg         Assessment & Plan:   1. Dyslipidemia   2. Type I (juvenile type) diabetes mellitus without mention of complication, not stated as uncontrolled   3. Hyperlipidemia LDL goal < 100   4. Benign hypertensive heart disease without heart failure    Orders Placed This Encounter  Procedures  . CMP14+EGFR  . NMR, lipoprofile  . POCT glycosylated hemoglobin (Hb A1C)  . POCT UA - Microalbumin   Meds ordered this encounter  Medications  . insulin lispro (HUMALOG) 100 UNIT/ML injection    Sig: Use in insulin pump.  Last pump report showed that patient's average daily insulin in about 50 units per day.    Dispense:  60 mL    Refill:  1    Order Specific Question:  Supervising Provider    Answer:  Ernestina Penna [[id number]]  . atorvastatin (LIPITOR) 40 MG tablet    Sig: Take 1 tablet (40 mg total) by mouth daily at 6 PM.    Dispense:  90 tablet    Refill:  1    Order Specific Question:  Supervising Provider    Answer:  Ernestina Penna [[id number]]  . lisinopril-hydrochlorothiazide (PRINZIDE,ZESTORETIC) 20-12.5 MG per tablet    Sig: Take 1 tablet by mouth daily.    Dispense:  90 tablet    Refill:  1    Order Specific Question:  Supervising Provider    Answer:  Deborra Medina    Continue all meds Labs pending Diet and exercise encouraged Health maintenance reviewed Follow up in 6 month   Mary-Margaret Daphine Deutscher, FNP

## 2013-07-08 NOTE — Patient Instructions (Signed)

## 2013-07-09 ENCOUNTER — Other Ambulatory Visit: Payer: Self-pay

## 2013-07-10 LAB — NMR, LIPOPROFILE
Cholesterol: 128 mg/dL (ref <200)
HDL Particle Number: 32.5 umol/L (ref >=30.5)
LDL Particle Number: 955 nmol/L (ref <1000)
LDLC SERPL CALC-MCNC: 60 mg/dL (ref <100)
Triglycerides by NMR: 72 mg/dL (ref <150)

## 2013-07-10 LAB — CMP14+EGFR
Albumin: 4.3 g/dL (ref 3.5–5.5)
BUN/Creatinine Ratio: 12 (ref 9–23)
BUN: 8 mg/dL (ref 6–24)
CO2: 30 mmol/L — ABNORMAL HIGH (ref 18–29)
Chloride: 98 mmol/L (ref 97–108)
Creatinine, Ser: 0.69 mg/dL (ref 0.57–1.00)
GFR calc Af Amer: 119 mL/min/{1.73_m2} (ref >59)
Globulin, Total: 1.9 g/dL (ref 1.5–4.5)
Glucose: 137 mg/dL — ABNORMAL HIGH (ref 65–99)
Total Protein: 6.2 g/dL (ref 6.0–8.5)

## 2013-07-24 ENCOUNTER — Other Ambulatory Visit: Payer: Self-pay | Admitting: Nurse Practitioner

## 2013-08-04 ENCOUNTER — Telehealth: Payer: Self-pay | Admitting: Nurse Practitioner

## 2013-08-04 NOTE — Telephone Encounter (Signed)
Patient out of humalog for her insulin pump and can not get it until tomorrow from the pharmacy.  Left her one vial to pick up at the front desk and called patient.

## 2013-08-25 NOTE — Progress Notes (Signed)
Patient ID: Maria Conley, female   DOB: 07/10/1966, 47 y.o.   MRN: [id number] ATTENDING PHYSICIAN NOTE: I have reviewed the chart and agree with the plan as detailed above. Eleri Ruben MD Pager [phone]  

## 2013-09-07 ENCOUNTER — Telehealth: Payer: Self-pay | Admitting: Nurse Practitioner

## 2013-09-22 ENCOUNTER — Ambulatory Visit (INDEPENDENT_AMBULATORY_CARE_PROVIDER_SITE_OTHER): Payer: Self-pay | Admitting: Family Medicine

## 2013-09-22 VITALS — BP 128/82 | HR 76 | Wt 195.0 lb

## 2013-09-22 DIAGNOSIS — E119 Type 2 diabetes mellitus without complications: Secondary | ICD-10-CM

## 2013-09-22 NOTE — Progress Notes (Signed)
Patient presents for 3 mo f/u DM as part of the employee sponsored Link to [employer]. Medications, glucose readings, and insulin regimen have been reviewed. I have also discussed with patient lifestyle interventions such as diet and exercise. Full documentation of this visit can be found in the [facility] documenting system through [facility] However specific areas of concern include the following:  Diabetes Mellitus: POC A1C 6.5, at goal but patient having a lot of fasting lows. However, she has been testing extremely infrequently (2-6x/week) so it's difficult to decipher any type of pattern. Her basal rate is 1 u/h the whole day. I told patient most patients will have several different rates during the day and her overnight may need to be adjusted. patient wants a new pump that syncs with a meter. She has had her pump for 6 years. plan 1.) Patient will call medtronic to set up new pump/meter 2.) patient will call me when she gets her new pump 3.) will get her to dl carelink and then send her to [facility] for pump adjustments 4.) f/u 3 mo for visit but sooner via phone to coordinate care for new pump  Hyperlipidemia: patient was taking lipitor but stopped it on her own because she was experiencing severe muscle aches. She has tolerated crestor in the past fine. I will ask doctor for prescription for crestor 10 mg which is the equivalent dose to lipitor 40 mg. Per the 2013 ACC/AHA guidelines she just needs a moderate intensity statin which crestor 10 satifies.  Hypertension: at goal on lisinopril/hctz  Cost Savings Intervention Outcomes: Medical problem identified; Supplies provided, proactive intervention

## 2013-09-28 ENCOUNTER — Telehealth: Payer: Self-pay | Admitting: Nurse Practitioner

## 2013-09-28 MED ORDER — ROSUVASTATIN CALCIUM 10 MG PO TABS: 10.0000 mg | ORAL_TABLET | Freq: Every day | ORAL | Status: DC

## 2013-09-28 NOTE — Telephone Encounter (Signed)
rx sent to pharmacy

## 2013-10-02 ENCOUNTER — Telehealth: Payer: Self-pay | Admitting: Nurse Practitioner

## 2013-10-05 NOTE — Telephone Encounter (Signed)
Sent to [facility] to sign, will fax back asap

## 2013-10-06 NOTE — Telephone Encounter (Signed)
Faxed 10/06/13

## 2013-10-26 ENCOUNTER — Encounter: Payer: Self-pay | Admitting: Family Medicine

## 2013-10-26 ENCOUNTER — Ambulatory Visit (INDEPENDENT_AMBULATORY_CARE_PROVIDER_SITE_OTHER): Payer: 59 | Admitting: Family Medicine

## 2013-10-26 VITALS — BP 120/77 | HR 81 | Temp 98.7°F | Ht 65.0 in | Wt 194.0 lb

## 2013-10-26 DIAGNOSIS — M549 Dorsalgia, unspecified: Secondary | ICD-10-CM

## 2013-10-26 MED ORDER — METHYLPREDNISOLONE SODIUM SUCC 125 MG IJ SOLR
125.0000 mg | Freq: Once | INTRAMUSCULAR | Status: AC
  Administered 2013-10-26: 125 mg via INTRAMUSCULAR

## 2013-10-26 MED ORDER — KETOROLAC TROMETHAMINE 60 MG/2ML IM SOLN: 30.0000 mg | Freq: Once | INTRAMUSCULAR | Status: DC

## 2013-10-26 MED ORDER — PREDNISONE 50 MG PO TABS: ORAL_TABLET | ORAL | Status: DC

## 2013-10-26 MED ORDER — CYCLOBENZAPRINE HCL 5 MG PO TABS: 5.0000 mg | ORAL_TABLET | Freq: Three times a day (TID) | ORAL | Status: DC | PRN

## 2013-10-26 NOTE — Addendum Note (Signed)
Addended by: Gwenith DailyHUDY, KRISTEN N on: 10/26/2013 10:35 AM   Modules accepted: Orders

## 2013-10-26 NOTE — Progress Notes (Signed)
   Subjective:    Patient ID: Maria Conley, female    DOB: 08/21/1966, 48 y.o.   MRN: [id number]  HPI Pt presents today with chief complaint with back pain recurrence.  Has hx/o thoracic back spasms.  Last seen for this 01/2013.  Pt was given toradol 30mg  IM x1 as well as prednisone and flexeril Pt states this combination gave significant improvement within 24 hours.  Has had no episodes of severe back pain since 01/2013 until this weekend.  States that she repetively lifted 10 lb bag of potatoes.  No radicular sxs.  No diffuse muscle pain, fever or chills.  Basline insulin dependent diabetic. Last A1C 6.2. On insulin pump.      Review of Systems  All other systems reviewed and are negative.       Objective:   Physical Exam  Constitutional: She appears well-developed and well-nourished.  HENT:  Head: Normocephalic and atraumatic.  Eyes: Conjunctivae are normal. Pupils are equal, round, and reactive to light.  Neck: Normal range of motion. Neck supple.  Cardiovascular: Normal rate and regular rhythm.   Pulmonary/Chest: Effort normal and breath sounds normal.  Abdominal: Soft. Bowel sounds are normal.  Musculoskeletal:       Arms: Neurological: She is alert.  Skin: Skin is warm.          Assessment & Plan:  Back pain - Plan: ketorolac (TORADOL) injection 30 mg, predniSONE (DELTASONE) 50 MG tablet, cyclobenzaprine (FLEXERIL) 5 MG tablet  Exam and history consistent with thoracic strain recurrence.  Will give similar regimen from 01/2013 (unable to give toradol as this is on back order).  Solumedrol 125mg  IM x1 Short course of prednisone and flexeril  Watch sugars closely in setting of steroid use and baseline IDDM.  Discussed MSK red flags.  Follow up as needed.

## 2013-10-27 ENCOUNTER — Other Ambulatory Visit: Payer: Self-pay | Admitting: Pharmacist

## 2013-11-10 NOTE — Progress Notes (Signed)
Patient ID: Maria Conley, female   DOB: 12/27/1965, 48 y.o.   MRN: [id number] ATTENDING PHYSICIAN NOTE: I have reviewed the chart and agree with the plan as detailed above. Denny LevySara Devera Englander MD Pager [phone]

## 2013-12-09 ENCOUNTER — Telehealth: Payer: Self-pay | Admitting: Nurse Practitioner

## 2013-12-09 MED ORDER — GLUCOSE BLOOD VI STRP: ORAL_STRIP | Status: DC

## 2013-12-09 NOTE — Telephone Encounter (Signed)
rx sent to pharmacy

## 2013-12-29 ENCOUNTER — Ambulatory Visit (INDEPENDENT_AMBULATORY_CARE_PROVIDER_SITE_OTHER): Payer: Self-pay | Admitting: Family Medicine

## 2013-12-29 VITALS — BP 116/63 | HR 82 | Wt 198.0 lb

## 2013-12-29 DIAGNOSIS — E119 Type 2 diabetes mellitus without complications: Secondary | ICD-10-CM

## 2013-12-29 NOTE — Progress Notes (Signed)
Patient presents for 3 mo f/u DM as part of the employee sponsored Link to [employer]. Medications, glucose readings, and insulin regimen have been reviewed. I have also discussed with patient lifestyle interventions such as diet and exercise. Full documentation of this visit can be found in the [employer] documenting system through [facility] However specifics from this visit include the following:  Diabetes Mellitus: POC a1c 6.7, at goal. Patient got new pump in february. Has not had it adjusted. I can see that her basal rates need some work. She is not basal/bolusing 50/50 at this point. She has more basal on board. Having quite a few lows as well. Her 6 pm 8pm rate needs to go up and her overnight down. plan 1.) will send her to [facility] for adjustments. Will put a phone call visit on my calender for the end of next week so I can see if she has made an appt 2.) f/u 3 mo since A1C is at goal but I will be following patient along to make sure she is getting pump adjustments.  Hyperlipidemia: got patient put on crestor after lipitor was causing severe muscle aches. Patient is tolerating well.  Hypertension: BP at goal on lisinopril/hctz  Patient has set a series of personal goals and will f/u in 3 mo for further review of DM

## 2014-01-05 ENCOUNTER — Ambulatory Visit (INDEPENDENT_AMBULATORY_CARE_PROVIDER_SITE_OTHER): Payer: 59 | Admitting: Nurse Practitioner

## 2014-01-05 ENCOUNTER — Encounter: Payer: Self-pay | Admitting: Nurse Practitioner

## 2014-01-05 VITALS — BP 129/78 | HR 78 | Temp 98.4°F | Ht 65.0 in | Wt 197.4 lb

## 2014-01-05 DIAGNOSIS — R079 Chest pain, unspecified: Secondary | ICD-10-CM

## 2014-01-05 DIAGNOSIS — E109 Type 1 diabetes mellitus without complications: Secondary | ICD-10-CM

## 2014-01-05 DIAGNOSIS — F17200 Nicotine dependence, unspecified, uncomplicated: Secondary | ICD-10-CM

## 2014-01-05 DIAGNOSIS — E785 Hyperlipidemia, unspecified: Secondary | ICD-10-CM

## 2014-01-05 DIAGNOSIS — Z720 Tobacco use: Secondary | ICD-10-CM

## 2014-01-05 LAB — POCT GLYCOSYLATED HEMOGLOBIN (HGB A1C)

## 2014-01-05 NOTE — Patient Instructions (Signed)
Diabetes and Foot Care Diabetes may cause you to have problems because of poor blood supply (circulation) to your feet and legs. This may cause the skin on your feet to become thinner, break easier, and heal more slowly. Your skin may become dry, and the skin may peel and crack. You may also have nerve damage in your legs and feet causing decreased feeling in them. You may not notice minor injuries to your feet that could lead to infections or more serious problems. Taking care of your feet is one of the most important things you can do for yourself.  HOME CARE INSTRUCTIONS  Wear shoes at all times, even in the house. Do not go barefoot. Bare feet are easily injured.  Check your feet daily for blisters, cuts, and redness. If you cannot see the bottom of your feet, use a mirror or ask someone for help.  Wash your feet with warm water (do not use hot water) and mild soap. Then pat your feet and the areas between your toes until they are completely dry. Do not soak your feet as this can dry your skin.  Apply a moisturizing lotion or petroleum jelly (that does not contain alcohol and is unscented) to the skin on your feet and to dry, brittle toenails. Do not apply lotion between your toes.  Trim your toenails straight across. Do not dig under them or around the cuticle. File the edges of your nails with an emery board or nail file.  Do not cut corns or calluses or try to remove them with medicine.  Wear clean socks or stockings every day. Make sure they are not too tight. Do not wear knee-high stockings since they may decrease blood flow to your legs.  Wear shoes that fit properly and have enough cushioning. To break in new shoes, wear them for just a few hours a day. This prevents you from injuring your feet. Always look in your shoes before you put them on to be sure there are no objects inside.  Do not cross your legs. This may decrease the blood flow to your feet.  If you find a minor scrape,  cut, or break in the skin on your feet, keep it and the skin around it clean and dry. These areas may be cleansed with mild soap and water. Do not cleanse the area with peroxide, alcohol, or iodine.  When you remove an adhesive bandage, be sure not to damage the skin around it.  If you have a wound, look at it several times a day to make sure it is healing.  Do not use heating pads or hot water bottles. They may burn your skin. If you have lost feeling in your feet or legs, you may not know it is happening until it is too late.  Make sure your health care provider performs a complete foot exam at least annually or more often if you have foot problems. Report any cuts, sores, or bruises to your health care provider immediately. SEEK MEDICAL CARE IF:   You have an injury that is not healing.  You have cuts or breaks in the skin.  You have an ingrown nail.  You notice redness on your legs or feet.  You feel burning or tingling in your legs or feet.  You have pain or cramps in your legs and feet.  Your legs or feet are numb.  Your feet always feel cold. SEEK IMMEDIATE MEDICAL CARE IF:   There is increasing redness,   swelling, or pain in or around a wound.  There is a red line that goes up your leg.  Pus is coming from a wound.  You develop a fever or as directed by your health care provider.  You notice a bad smell coming from an ulcer or wound. Document Released: 08/17/2000 Document Revised: 04/22/2013 Document Reviewed: 01/27/2013 ExitCare Patient Information 2014 [employer].  

## 2014-01-05 NOTE — Progress Notes (Signed)
Subjective:    Patient ID: Maria Conley, female    DOB: February 18, 1966, 48 y.o.   MRN: [id number]  Patient here today for follow up of chronic medical problems.  Diabetes She presents for her follow-up diabetic visit. She has type 1 diabetes mellitus. No MedicAlert identification noted. Her disease course has been stable. There are no hypoglycemic associated symptoms. Pertinent negatives for diabetes include no blurred vision, no chest pain, no fatigue, no polydipsia and no polyphagia. There are no hypoglycemic complications. Symptoms are stable. There are no diabetic complications. Pertinent negatives for diabetic complications include no peripheral neuropathy. Risk factors for coronary artery disease include diabetes mellitus, dyslipidemia, family history and tobacco exposure. Current diabetic treatment includes insulin pump. She is compliant with treatment all of the time. Her weight is stable. She is following a diabetic diet. Meal planning includes carbohydrate counting. She has not had a previous visit with a dietician. She participates in exercise daily. Her home blood glucose trend is fluctuating minimally. Her breakfast blood glucose is taken between 7-8 am. Her breakfast blood glucose range is generally <70 mg/dl. Her lunch blood glucose range is generally 110-130 mg/dl. Her dinner blood glucose range is generally 110-130 mg/dl. Her highest blood glucose is 110-130 mg/dl. An ACE inhibitor/angiotensin II receptor blocker is being taken. She does not see a podiatrist.Eye exam is not current (last one was 1 1/2).  Hypertension This is a chronic problem. The current episode started more than 1 year ago. The problem is unchanged. The problem is controlled. Pertinent negatives include no blurred vision, chest pain, orthopnea, peripheral edema or shortness of breath. Risk factors for coronary artery disease include smoking/tobacco exposure, diabetes mellitus, dyslipidemia and family history. Past  treatments include ACE inhibitors and diuretics. The current treatment provides significant improvement. There is no history of a thyroid problem. There is no history of sleep apnea.  Hyperlipidemia This is a chronic problem. The current episode started more than 1 year ago. The problem is controlled. Recent lipid tests were reviewed and are normal. Exacerbating diseases include diabetes. She has no history of liver disease or obesity. Factors aggravating her hyperlipidemia include smoking. Pertinent negatives include no chest pain or shortness of breath. Current antihyperlipidemic treatment includes statins. The current treatment provides moderate improvement of lipids. There are no compliance problems.  Risk factors for coronary artery disease include diabetes mellitus, dyslipidemia, family history and hypertension.      Review of Systems  Constitutional: Negative for fatigue.  Eyes: Negative for blurred vision.  Respiratory: Negative for shortness of breath.   Cardiovascular: Negative for chest pain and orthopnea.  Endocrine: Negative for polydipsia and polyphagia.  All other systems reviewed and are negative.      Objective:   Physical Exam  Vitals reviewed. Constitutional: She is oriented to person, place, and time. She appears well-developed and well-nourished.  HENT:  Head: Normocephalic.  Right Ear: External ear normal.  Left Ear: External ear normal.  Nose: Nose normal.  Mouth/Throat: Oropharynx is clear and moist.  Eyes: Pupils are equal, round, and reactive to light.  Neck: Normal range of motion. Neck supple.  Cardiovascular: Normal rate, regular rhythm, normal heart sounds and intact distal pulses.   No murmur heard. Pulmonary/Chest: Effort normal and breath sounds normal. No respiratory distress. She has no wheezes.  Abdominal: Soft. Bowel sounds are normal.  Musculoskeletal: Normal range of motion. She exhibits no edema and no tenderness.  Neurological: She is alert  and oriented to person, place, and time. No  cranial nerve deficit.  Skin: Skin is warm and dry.  Psychiatric: She has a normal mood and affect. Her behavior is normal. Judgment and thought content normal.   BP 129/78  Pulse 78  Temp(Src) 98.4 F (36.9 C) (Oral)  Ht _0  (1.651 m)  Wt 197 lb 6.4 oz (89.54 kg)  BMI 32.85 kg/m2  . Results for orders placed in visit on 01/05/14  POCT GLYCOSYLATED HEMOGLOBIN (HGB A1C)      Result Value Ref Range   Hemoglobin A1C 6.4%         Assessment & Plan:   1. Chest pain   2. Type I (juvenile type) diabetes mellitus without mention of complication, not stated as uncontrolled   3. Tobacco abuse   4. Hyperlipidemia LDL goal < 100    Orders Placed This Encounter  Procedures  . CMP14+EGFR  . NMR, lipoprofile  . POCT glycosylated hemoglobin (Hb A1C)    Encouraged to get eye exam Labs pending Health maintenance reviewed Diet and exercise encouraged Continue all meds Follow up  In 3 month   [place], FNP

## 2014-01-06 LAB — CMP14+EGFR
ALT: 15 IU/L (ref 0–32)
AST: 15 IU/L (ref 0–40)
Albumin/Globulin Ratio: 2.2 (ref 1.1–2.5)
Albumin: 4.4 g/dL (ref 3.5–5.5)
Alkaline Phosphatase: 58 IU/L (ref 39–117)
BUN/Creatinine Ratio: 14 (ref 9–23)
BUN: 11 mg/dL (ref 6–24)
CHLORIDE: 95 mmol/L — AB (ref 97–108)
CO2: 24 mmol/L (ref 18–29)
Calcium: 9.4 mg/dL (ref 8.7–10.2)
Creatinine, Ser: 0.8 mg/dL (ref 0.57–1.00)
GFR calc non Af Amer: 87 mL/min/{1.73_m2} (ref >59)
GFR, EST AFRICAN AMERICAN: 100 mL/min/{1.73_m2} (ref >59)
GLUCOSE: 174 mg/dL — AB (ref 65–99)
Globulin, Total: 2 g/dL (ref 1.5–4.5)
POTASSIUM: 4.7 mmol/L (ref 3.5–5.2)
Sodium: 134 mmol/L (ref 134–144)
TOTAL PROTEIN: 6.4 g/dL (ref 6.0–8.5)
Total Bilirubin: 0.3 mg/dL (ref 0.0–1.2)

## 2014-01-06 LAB — NMR, LIPOPROFILE
Cholesterol: 152 mg/dL (ref <200)
HDL Cholesterol by NMR: 57 mg/dL (ref >=40)
HDL Particle Number: 31.7 umol/L (ref >=30.5)
LDL PARTICLE NUMBER: 1036 nmol/L — AB (ref <1000)
LDL SIZE: 21 nm (ref >20.5)
LDLC SERPL CALC-MCNC: 82 mg/dL (ref <100)
LP-IR SCORE: 33 (ref <=45)
Small LDL Particle Number: 415 nmol/L (ref <=527)
TRIGLYCERIDES BY NMR: 66 mg/dL (ref <150)

## 2014-03-02 ENCOUNTER — Telehealth: Payer: Self-pay | Admitting: Nurse Practitioner

## 2014-03-02 ENCOUNTER — Encounter: Payer: Self-pay | Admitting: Physician Assistant

## 2014-03-02 ENCOUNTER — Ambulatory Visit (INDEPENDENT_AMBULATORY_CARE_PROVIDER_SITE_OTHER): Payer: 59 | Admitting: Physician Assistant

## 2014-03-02 VITALS — BP 132/76 | HR 102 | Temp 99.6°F | Ht 65.0 in | Wt 193.0 lb

## 2014-03-02 DIAGNOSIS — M25562 Pain in left knee: Secondary | ICD-10-CM

## 2014-03-02 DIAGNOSIS — M25569 Pain in unspecified knee: Secondary | ICD-10-CM

## 2014-03-02 MED ORDER — MELOXICAM 15 MG PO TABS: 15.0000 mg | ORAL_TABLET | Freq: Every day | ORAL | Status: DC

## 2014-03-02 NOTE — Progress Notes (Signed)
Subjective:     Patient ID: Maria Conley, female   DOB: 08/11/1966, 48 y.o.   MRN: [id number]  HPI Pt with L knee pain that has been intermit for yrs Sx have been worse over the last week Sx to the superior medial and behind the kneecap Sx worse going down steps or hills No known trauma or prev surgery  Review of Systems Denies locking or giving way of knee No bruising or swelling of the knee OTC NSAIDS help sx    Objective:   Physical Exam No ecchy/edema to the knee No effusion FROM w/o crepitus + TTP superior medial knee + patellar compression No laxity noted No calf pain Good strength distal    Assessment:     L knee pain    Plan:     Suspect patellar femoral problems Heat/Ice Hold OTC NSAIDS Mobic rx Neoprene sleeve with patellar stab Quad exercises F/U prn

## 2014-03-02 NOTE — Telephone Encounter (Signed)
appt given and tolerated well

## 2014-03-02 NOTE — Patient Instructions (Signed)
Patellar Dislocation and Subluxation with Phase I Rehab Injuries to the knee often include kneecap (patellar) dislocation or subluxation. The patella is a V-shaped bone that sits in a groove in the thigh bone (trochlea). A patellar dislocation occurs when the kneecap is displaced from the trochlea, and the joint surfaces are no longer touching. A subluxation is a similar injury, where the kneecap becomes displaced, but the joint surfaces are still touching. Patellar dislocations and subluxations are most common in adolescents and younger adults. SYMPTOMS   Severe pain in the front of the knee when attempting to move the knee.  A feeling of the knee giving way.  Tenderness, swelling, and bruising (contusion) of the knee.  Numbness or paralysis below the dislocation, from pinching, cutting, or pressure on the blood vessels or nerves (uncommon).  Visible deformity, especially if the dislocation of the kneecap occurs towards the outside of the knee.  Lump on the inner knee, which is the end of the inner part of the thigh bone (femur). CAUSES  Patellar dislocations are caused by a force placed on the kneecap, that is strong enough to displace the bone from its proper alignment. Common causes of injury include:  Direct hit (trauma) to the knee.  Twisting or pivoting injury to the lower limb, when the foot is planted on the ground.  Powerful muscle contraction.  Birth defect (congenital abnormality), such as shallow or malformed joint surfaces. RISK INCREASES WITH:  Contact sports (football, rugby, soccer), sports that require jumping and landing (basketball, volleyball), or sports in which cleats are worn on shoes.  People with wide pelvis, knocked knees, shallow or malformed joint surfaces.  Previous knee injury.  Poor strength and flexibility. PREVENTION  Warm up and stretch properly before activity.  Maintain physical fitness:  Strength, flexibility, and  endurance.  Cardiovascular fitness.  For jumping or contact sports, protect the kneecap with supportive devices (elastic bandages, tape, braces, knee sleeves with a hole for the patella and a built-up outer side, or straps to pull the patella inward, or knee pads).  Use cleats of proper length. PROGNOSIS  If treated properly, patellar dislocations and subluxations usually require at least 6 weeks to heal.  RELATED COMPLICATIONS   Associated fracture or joint cartilage injury.  Damage to nearby nerves or major blood vessels (rare).  Longer healing time or recurring dislocation, if activity is resumed too soon.  Excessive bleeding within the knee, due to dislocation.  Kneecap pain and giving way, often due to inadequate or incomplete rehabilitation.  Unstable or arthritic joint, following repeated injury or delayed treatment. TREATMENT Patellar dislocations and subluxations require immediate realigning of the bones (reduction). Realigning is often completed by hand. However, surgery is sometimes needed. After realignment, treatment first involves the use of ice and medicine, to reduce pain and inflammation. Elevating the injured knee above the level of the heart will also help reduce inflammation. Restraining the knee is often needed, to allow for healing, for up to 6 weeks. After restraint, it is important to perform strengthening and stretching exercises to help regain strength and a full range of motion. These exercises may be completed at home or with a therapist. MEDICATION   If pain medicine is needed, nonsteroidal anti-inflammatory medicines (aspirin and ibuprofen), or other minor pain relievers (acetaminophen), are often advised.  Do not take pain medicine for 7 days before surgery.  Prescription pain relievers may be given, if your caregiver thinks they are needed. Use only as directed and only as much as   you need. HEAT AND COLD  Cold treatment (icing) should be applied for 10  to 15 minutes every 2 to 3 hours for inflammation and pain, and immediately after activity that aggravates your symptoms. Use ice packs or an ice massage.  Heat treatment may be used before performing stretching and strengthening activities prescribed by your caregiver, physical therapist, or athletic trainer. Use a heat pack or a warm water soak. SEEK MEDICAL CARE IF:  Pain, tenderness, or swelling gets worse, despite treatment.  You experience pain, numbness, or coldness in the foot.  Blue, gray, or dark color appears in the toenails.  Any of the following signs of infection occur after surgery: fever, increased pain, swelling, redness, drainage of fluids, or bleeding in the affected area.  New, unexplained symptoms develop. (Drugs used in treatment may produce side effects.) EXERCISES PHASE I EXERCISES RANGE OF MOTION (ROM) AND STRETCHING EXERCISES - Patellar Dislocation and Subluxation Phase I  FIRST TIME DISLOCATIONS OR SUBLUXATIONS: If you have dislocated or subluxed your kneecap for the first time, your caregiver may ask you to wear a knee brace for up to 6 weeks after your injury. This brace will keep your knee completely straight so that the healing tissues are not disrupted by your knee movement. Your caregiver may allow some motion at your knee by using a hinged brace or by allowing you to take your brace off for a limited amount of time to gently bend and straighten your knee. If this is the case, closely follow your caregiver's directions, in order to allow the best recovery.   CHRONIC OR REPEATED DISLOCATIONS OR SUBLUXATIONS: If you have chronic or repeated dislocations or subluxations, your caregiver will likely not ask you to use a brace. He or she will likely have you begin gentle range of motion activities, within the range that is comfortable for you.  Once you are allowed to start moving your knee, these are some of the initial exercises that may be included in your  rehabilitation program. Continue to perform them until you see your caregiver again or until your symptoms go away. While completing these exercises, remember:   Restoring tissue flexibility helps normal motion to return to the joints. This allows healthier, less painful movement and activity.  An effective stretch should be held for at least 30 seconds.  A stretch should never be painful. You should only feel a gentle lengthening or release in the stretched tissue. RANGE OF MOTION - Knee Flexion, Active  Lie on your back with both knees straight. (If this causes back discomfort, bend your opposite knee, placing your foot flat on the floor.)  Slowly slide your heel back toward your buttocks until you feel a gentle stretch in the front of your knee or thigh.  Hold for __________ seconds. Slowly slide your heel back to the starting position. Repeat __________ times. Complete this exercise __________ times per day.  RANGE OF MOTION - Knee Flexion and Extension, Active-Assisted  Sit on the edge of a table or chair with your thighs firmly supported. It may be helpful to place a folded towel under the end of your right / left thigh.  Flexion (bending): Place the ankle of your healthy leg on top of the other ankle. Use your healthy leg to gently bend your right / left knee until you feel a mild tension across the top of your knee.  Hold for __________ seconds.  Extension (straightening): Switch your ankles so your right / left leg is on  top. Use your healthy leg to straighten your right / left knee until you feel a mild tension on the backside of your knee.  Hold for __________ seconds. Repeat __________ times. Complete this exercise __________ times per day. STRETCH - Knee Flexion, Supine  Lie on the floor with your right / left heel and foot lightly touching the wall. (Place both feet on the wall if you do not use a door frame.)  Without using any effort, allow gravity to slide your foot  down the wall slowly until you feel a gentle stretch in the front of your right / left knee.  Hold this stretch for __________ seconds. Then return the leg to the starting position, using your healthy leg for help, if needed. Repeat __________ times. Complete this stretch __________ times per day.  STRENGTHENING EXERCISES - Patellar Dislocation and Subluxation Phase I These exercises may help you when beginning to rehabilitate your injury. They may resolve your symptoms with or without further involvement from your physician, physical therapist or athletic trainer. While completing these exercises, remember:   Muscles can gain both the endurance and the strength needed for everyday activities through controlled exercises.  Complete these exercises as instructed by your physician, physical therapist or athletic trainer. Increase the resistance and repetitions only as guided by your caregiver. STRENGTH - Quadriceps, Isometrics  Lie on your back with your right / left leg extended and your opposite knee bent.  Gradually tense the muscles in the front of your right / left thigh. You should see either your kneecap slide up toward your hip or increased dimpling just above the knee. This motion will push the back of the knee down toward the floor, mat, or bed on which you are lying.  Hold the muscle as tight as you can, without increasing your pain, for __________ seconds.  Relax the muscles slowly and completely in between each repetition. Repeat __________ times. Complete this exercise __________ times per day.  STRENGTH - Quadriceps, Straight Leg Raises Quality counts! Watch for signs that the quadriceps muscle is working, to be sure you are strengthening the correct muscles and not "cheating" by substituting with healthier muscles.  Lay on your back with your right / left leg extended and your opposite knee bent.  Tense the muscles in the front of your right / left thigh. You should see either  your kneecap slide up or increased dimpling just above the knee. Your thigh may even shake a bit.  Tighten these muscles even more and raise your leg 4 to 6 inches off the floor. Hold for __________ seconds.  Keeping these muscles tense, lower your leg.  Relax the muscles slowly and completely between each repetition. Repeat __________ times. Complete this exercise __________ times per day.  STRENGTH - Hip Abductors, Straight Leg Raises Be aware of your form throughout the entire exercise, so that you exercise the correct muscles. Poor form means that you are not strengthening the correct muscles.  Lie on your side so that your head, shoulders, knee and hip line up. You may bend your lower knee to help maintain your balance. Your right / left leg should be on top.  Roll your hips slightly forward, so that your hips are stacked directly over each other and your right / left knee is facing forward.  Lift your top leg up 4-6 inches, leading with your heel. Be sure that your foot does not drift forward or that your knee does not roll toward the ceiling.  Hold this position for __________ seconds. You should feel the muscles in your outer hip lifting. (You may not notice this until your leg begins to tire.)  Slowly lower your leg to the starting position. Allow the muscles to fully relax before beginning the next repetition. Repeat __________ times. Complete this exercise __________ times per day.  STRENGTH - Hip Extensors, Straight Leg Raises  Lie on your stomach on a firm surface.  Tense the muscles in your buttocks to lift your right / left leg about 4 inches. If you cannot lift your leg this high without arching your back, place a pillow under your hips.  Keep your knee straight. Hold __________ seconds.  Slowly lower your leg to the starting position and allow it to relax completely before completing the next repetition. Repeat __________ times. Complete this exercise __________ times  per day.  STRENGTH - Hip Adductors, Straight Leg Raises  Lie on your side so that your head, shoulders, knee and hip line up. You may place your upper foot in front, to help maintain your balance. Your right / left leg should be on the bottom.  Roll your hips slightly forward, so that your hips are stacked directly over each other and your right / left knee is facing forward.  Tense the muscles in your inner thigh and lift your bottom leg 4-6 inches. Hold this position for __________ seconds.  Slowly lower your leg to the starting position. Allow the muscles to fully relax before beginning the next repetition. Repeat __________ times. Complete this exercise __________ times per day.  Document Released: 08/20/2005 Document Revised: 11/12/2011 Document Reviewed: 12/02/2008 [facility] Patient Information 2015 [place], [place]. This information is not intended to replace advice given to you by your health care provider. Make sure you discuss any questions you have with your health care provider.

## 2014-03-17 ENCOUNTER — Encounter: Payer: Self-pay | Admitting: Nurse Practitioner

## 2014-03-17 ENCOUNTER — Ambulatory Visit (INDEPENDENT_AMBULATORY_CARE_PROVIDER_SITE_OTHER): Payer: 59 | Admitting: Nurse Practitioner

## 2014-03-17 VITALS — BP 118/70 | HR 98 | Temp 98.0°F | Ht 65.0 in | Wt 194.6 lb

## 2014-03-17 DIAGNOSIS — E785 Hyperlipidemia, unspecified: Secondary | ICD-10-CM

## 2014-03-17 DIAGNOSIS — I119 Hypertensive heart disease without heart failure: Secondary | ICD-10-CM

## 2014-03-17 DIAGNOSIS — E109 Type 1 diabetes mellitus without complications: Secondary | ICD-10-CM

## 2014-03-17 DIAGNOSIS — Z0289 Encounter for other administrative examinations: Secondary | ICD-10-CM

## 2014-03-17 NOTE — Progress Notes (Addendum)
   Subjective:    Patient ID: Maria Conley, female    DOB: 01/30/1966, 48 y.o.   MRN: [id number]  HPI Patient here today to have DMV forms filled out. SHe has diabetes , HTN and hyperlipidemia and was just seen for follow up on May 5,2015. SHe is also involved in link to wellness though [facility] and sees clinical pharmacist and diabetic educator 2 times a year. Her last HGBA1C was 6.4%    Review of Systems     Objective:   Physical Exam  Constitutional: She is oriented to person, place, and time. She appears well-developed and well-nourished.  Cardiovascular: Normal rate, regular rhythm and normal heart sounds.   Pulmonary/Chest: Effort normal and breath sounds normal.  Neurological: She is alert and oriented to person, place, and time.  Skin: Skin is warm and dry.  Psychiatric: She has a normal mood and affect. Her behavior is normal. Judgment and thought content normal.   BP 118/70  Pulse 98  Temp(Src) 98 F (36.7 C) (Oral)  Ht 5\' 5"  (1.651 m)  Wt 194 lb 9.6 oz (88.27 kg)  BMI 32.38 kg/m2        Assessment & Plan:   1. Health examination of defined subpopulation   2. Benign hypertensive heart disease without heart failure   3. Hyperlipidemia with target LDL less than 100   4. Type I (juvenile type) diabetes mellitus without mention of complication, not stated as uncontrolled       Labs pending Health maintenance reviewed Diet and exercise encouraged Continue all meds Follow up  In 3 months   Mary-Margaret Daphine DeutscherMartin, FNP

## 2014-03-25 NOTE — Progress Notes (Signed)
Patient ID: Maria Conley, female   DOB: 11/10/1965, 48 y.o.   MRN: [id number] ATTENDING PHYSICIAN NOTE: I have reviewed the chart and agree with the plan as detailed above. Denny LevySara Ninoska Goswick MD Pager [phone]

## 2014-03-31 ENCOUNTER — Other Ambulatory Visit: Payer: Self-pay | Admitting: Nurse Practitioner

## 2014-04-12 ENCOUNTER — Ambulatory Visit: Payer: 59 | Admitting: Nurse Practitioner

## 2014-04-15 ENCOUNTER — Ambulatory Visit (INDEPENDENT_AMBULATORY_CARE_PROVIDER_SITE_OTHER): Payer: 59 | Admitting: Family Medicine

## 2014-04-15 VITALS — BP 138/73 | HR 78 | Wt 198.0 lb

## 2014-04-15 DIAGNOSIS — E109 Type 1 diabetes mellitus without complications: Secondary | ICD-10-CM

## 2014-04-15 NOTE — Progress Notes (Signed)
Patient presents for 3 mo f/u DM as part of the employee sponsored [facility] [employer]. Medicatoins, insulin regimen, and glucose readings have been reviewed. I have also discussed with patient lifestyle interventions such as diet and exercise. Full documentation of this visit can be found in the [facility] documenting system through [facility] However specifics from this visit include the following:  Diabetes Mellitus: Other  POC A1c 6.6, at goal. However, patient has had 12 lows in the past 2 weeks. She works 7 p to 7 a and is dropping in the early morning hours. She then tends to be high in the evnings. So her extremed highs are averaging out with her lows which makes her A1C look good, but she really needs a pump adjustment because she's jumping from high to low too frequently. plan 1.) provided Meriam SpragueBeverly Paddock's number at [facility]. She is [employer] certified and will be able to make proper adjustments 2.) f.u 3 mo but will call patient in 2 week sto see if she has made the appointment.  Patient has set a series of personal goals and will f/u in 3 mo for further review of DM

## 2014-05-12 ENCOUNTER — Ambulatory Visit (INDEPENDENT_AMBULATORY_CARE_PROVIDER_SITE_OTHER): Payer: 59 | Admitting: Nurse Practitioner

## 2014-05-12 ENCOUNTER — Encounter: Payer: Self-pay | Admitting: Nurse Practitioner

## 2014-05-12 VITALS — BP 134/79 | HR 87 | Temp 99.1°F | Ht 65.0 in | Wt 194.0 lb

## 2014-05-12 DIAGNOSIS — Z713 Dietary counseling and surveillance: Secondary | ICD-10-CM

## 2014-05-12 DIAGNOSIS — E109 Type 1 diabetes mellitus without complications: Secondary | ICD-10-CM

## 2014-05-12 DIAGNOSIS — E785 Hyperlipidemia, unspecified: Secondary | ICD-10-CM

## 2014-05-12 DIAGNOSIS — M7710 Lateral epicondylitis, unspecified elbow: Secondary | ICD-10-CM

## 2014-05-12 DIAGNOSIS — M7711 Lateral epicondylitis, right elbow: Secondary | ICD-10-CM

## 2014-05-12 DIAGNOSIS — Z6832 Body mass index (BMI) 32.0-32.9, adult: Secondary | ICD-10-CM

## 2014-05-12 LAB — POCT GLYCOSYLATED HEMOGLOBIN (HGB A1C): Hemoglobin A1C: 6.4

## 2014-05-12 MED ORDER — PREDNISONE (PAK) 10 MG PO TABS: ORAL_TABLET | Freq: Every day | ORAL | Status: DC

## 2014-05-12 NOTE — Progress Notes (Signed)
Subjective:    Patient ID: Maria Conley, female    DOB: June 25, 1966, 48 y.o.   MRN: [id number]  HPI: Patient here for a follow up on chronic medical problems.  Has been having right elbow pain for 1 year taking Ibuprofen for relief.  Blood sugars having been higher than usual ranging from 396-48.  Meeting with the diabetic educator with [facility] in one month.  Diabetes She presents for her follow-up diabetic visit. She has type 1 diabetes mellitus. No MedicAlert identification noted. Her disease course has been stable. There are no hypoglycemic associated symptoms. Pertinent negatives for diabetes include no blurred vision, no chest pain, no fatigue, no polydipsia and no polyphagia. There are no hypoglycemic complications. Symptoms are stable. There are no diabetic complications. Pertinent negatives for diabetic complications include no peripheral neuropathy. Risk factors for coronary artery disease include diabetes mellitus, dyslipidemia, family history and tobacco exposure. Current diabetic treatment includes insulin pump. She is compliant with treatment all of the time. Her weight is stable. She is following a diabetic diet. Meal planning includes carbohydrate counting. She has not had a previous visit with a dietician. She participates in exercise daily. Her home blood glucose trend is fluctuating minimally. Her breakfast blood glucose is taken between 7-8 am. Her breakfast blood glucose range is generally <70 mg/dl. Her lunch blood glucose range is generally 110-130 mg/dl. Her dinner blood glucose range is generally 110-130 mg/dl. Her highest blood glucose is 110-130 mg/dl. An ACE inhibitor/angiotensin II receptor blocker is being taken. She does not see a podiatrist.Eye exam is not current (last one was 1 1/2).  Hypertension This is a chronic problem. The current episode started more than 1 year ago. The problem is unchanged. The problem is controlled. Pertinent negatives include no blurred  vision, chest pain, orthopnea, peripheral edema or shortness of breath. Risk factors for coronary artery disease include smoking/tobacco exposure, diabetes mellitus, dyslipidemia and family history. Past treatments include ACE inhibitors and diuretics. The current treatment provides significant improvement. There is no history of a thyroid problem. There is no history of sleep apnea.  Hyperlipidemia This is a chronic problem. The current episode started more than 1 year ago. The problem is controlled. Recent lipid tests were reviewed and are normal. Exacerbating diseases include diabetes. She has no history of liver disease or obesity. Factors aggravating her hyperlipidemia include smoking. Pertinent negatives include no chest pain or shortness of breath. Current antihyperlipidemic treatment includes statins. The current treatment provides moderate improvement of lipids. There are no compliance problems.  Risk factors for coronary artery disease include diabetes mellitus, dyslipidemia, family history and hypertension.      Review of Systems  Constitutional: Negative for fatigue.  Eyes: Negative for blurred vision.  Respiratory: Negative for shortness of breath.   Cardiovascular: Negative for chest pain and orthopnea.  Endocrine: Negative for polydipsia and polyphagia.  Musculoskeletal:       Right elbow pain for 1 year  Skin: Negative.   All other systems reviewed and are negative.      Objective:   Physical Exam  Vitals reviewed. Constitutional: She is oriented to person, place, and time. She appears well-developed and well-nourished.  HENT:  Head: Normocephalic.  Eyes: Pupils are equal, round, and reactive to light.  Neck: Normal range of motion. Neck supple.  Cardiovascular: Normal rate, regular rhythm, normal heart sounds and intact distal pulses.   No murmur heard. Pulmonary/Chest: Effort normal and breath sounds normal. No respiratory distress. She has no wheezes.  Abdominal:  Soft. Bowel sounds are normal.  Musculoskeletal: Normal range of motion. She exhibits no edema.       Right elbow: Tenderness found. Lateral epicondyle tenderness noted.  FROM with pain in lateral epicondyle during flexion and extension of wrist.  No weakness noted  Neurological: She is alert and oriented to person, place, and time. No cranial nerve deficit.  Skin: Skin is warm and dry.  Psychiatric: She has a normal mood and affect. Her behavior is normal. Judgment and thought content normal.     BP 134/79  Pulse 87  Temp(Src) 99.1 F (37.3 C) (Oral)  Ht _0  (1.651 m)  Wt 194 lb (87.998 kg)  BMI 32.28 kg/m2  Results for orders placed in visit on 05/12/14  POCT GLYCOSYLATED HEMOGLOBIN (HGB A1C)      Result Value Ref Range   Hemoglobin A1C 6.4%      Assessment & Plan:   1. Type I (juvenile type) diabetes mellitus without mention of complication, not stated as uncontrolled   2. Hyperlipidemia with target LDL less than 100   3. Tennis elbow, right   4. BMI 32.0-32.9,adult   5. Weight loss counseling, encounter for    Orders Placed This Encounter  Procedures  . CMP14+EGFR  . NMR, lipoprofile  . POCT glycosylated hemoglobin (Hb A1C)   Meds ordered this encounter  Medications  . predniSONE (STERAPRED UNI-PAK) 10 MG tablet    Sig: Take by mouth daily. Use as directed for 6 days    Dispense:  21 tablet    Refill:  0    Order Specific Question:  Supervising Provider    Answer:  Chipper Herb [[id number]]   Wear tennis elbow brace 24 hrs.  Take prednisone as directed and follow prn  May need to increase basil insulin while on prednisone Discussed weight management for patient with BMI> 25 Labs pending Health maintenance reviewed Diet and exercise encouraged Continue all meds Follow up  In 3 months   [place], FNP

## 2014-05-12 NOTE — Patient Instructions (Signed)
Tennis Elbow Your caregiver has diagnosed you with a condition often referred to as "tennis elbow." This results from small tears or soreness (inflammation) at the start (origin) of the extensor muscles of the forearm. Although the condition is often called tennis or golfer's elbow, it is caused by any repetitive action performed by your elbow. HOME CARE INSTRUCTIONS  If the condition has been short lived, rest may be the only treatment required. Using your opposite hand or arm to perform the task may help. Even changing your grip may help rest the extremity. These may even prevent the condition from recurring.  Longer standing problems, however, will often be relieved faster by:  Using anti-inflammatory agents.  Applying ice packs for 30 minutes at the end of the working day, at bed time, or when activities are finished.  Your caregiver may also have you wear a splint or sling. This will allow the inflamed tendon to heal. At times, steroid injections aided with a local anesthetic will be required along with splinting for 1 to 2 weeks. Two to three steroid injections will often solve the problem. In some long standing cases, the inflamed tendon does not respond to conservative (non-surgical) therapy. Then surgery may be required to repair it. MAKE SURE YOU:   Understand these instructions.  Will watch your condition.  Will get help right away if you are not doing well or get worse. Document Released: 08/20/2005 Document Revised: 11/12/2011 Document Reviewed: 04/07/2008 ExitCare Patient Information 2015 [employer]. This information is not intended to replace advice given to you by your health care provider. Make sure you discuss any questions you have with your health care provider.  

## 2014-05-13 LAB — CMP14+EGFR
ALK PHOS: 52 IU/L (ref 39–117)
ALT: 13 IU/L (ref 0–32)
AST: 13 IU/L (ref 0–40)
Albumin/Globulin Ratio: 2.4 (ref 1.1–2.5)
Albumin: 4.4 g/dL (ref 3.5–5.5)
BUN / CREAT RATIO: 11 (ref 9–23)
BUN: 9 mg/dL (ref 6–24)
CHLORIDE: 95 mmol/L — AB (ref 97–108)
CO2: 23 mmol/L (ref 18–29)
Calcium: 8.9 mg/dL (ref 8.7–10.2)
Creatinine, Ser: 0.81 mg/dL (ref 0.57–1.00)
GFR calc Af Amer: 99 mL/min/{1.73_m2} (ref >59)
GFR calc non Af Amer: 86 mL/min/{1.73_m2} (ref >59)
Globulin, Total: 1.8 g/dL (ref 1.5–4.5)
Glucose: 283 mg/dL — ABNORMAL HIGH (ref 65–99)
Potassium: 3.9 mmol/L (ref 3.5–5.2)
SODIUM: 133 mmol/L — AB (ref 134–144)
Total Bilirubin: 0.4 mg/dL (ref 0.0–1.2)
Total Protein: 6.2 g/dL (ref 6.0–8.5)

## 2014-05-13 LAB — NMR, LIPOPROFILE
CHOLESTEROL: 174 mg/dL (ref 100–199)
HDL Cholesterol by NMR: 56 mg/dL (ref >39)
HDL Particle Number: 30.6 umol/L (ref >=30.5)
LDL PARTICLE NUMBER: 1146 nmol/L — AB (ref <1000)
LDL SIZE: 21.1 nm (ref >20.5)
LDLC SERPL CALC-MCNC: 102 mg/dL — ABNORMAL HIGH (ref 0–99)
Small LDL Particle Number: 304 nmol/L (ref <=527)
Triglycerides by NMR: 79 mg/dL (ref 0–149)

## 2014-05-18 ENCOUNTER — Other Ambulatory Visit: Payer: Self-pay | Admitting: Nurse Practitioner

## 2014-05-19 NOTE — Telephone Encounter (Signed)
Sorry, i need help with this one also,must have directions and amt

## 2014-05-19 NOTE — Telephone Encounter (Signed)
This last pump report that I have for her is from about 1 year ago but the average daily insulin amount at that time was 50 units per day.  I would recommend sig of - use in insulin pump up to 50 units daily.  Patient needs to come in for pump download or stop by to have pump downloaded at next visit.

## 2014-05-20 ENCOUNTER — Encounter: Payer: Self-pay | Admitting: Family Medicine

## 2014-05-20 NOTE — Progress Notes (Signed)
Patient ID: Maria Conley, female   DOB: May 05, 1966, 48 y.o.   MRN: [id number] Reviewed: Agree with the documentation and management of our [facility] pharmacologist.

## 2014-06-14 ENCOUNTER — Encounter: Payer: Self-pay | Admitting: *Deleted

## 2014-06-14 ENCOUNTER — Encounter: Payer: 59 | Attending: Family Medicine | Admitting: *Deleted

## 2014-06-14 VITALS — Ht 65.0 in | Wt 195.7 lb

## 2014-06-14 DIAGNOSIS — E109 Type 1 diabetes mellitus without complications: Secondary | ICD-10-CM | POA: Insufficient documentation

## 2014-06-14 DIAGNOSIS — Z794 Long term (current) use of insulin: Secondary | ICD-10-CM | POA: Diagnosis not present

## 2014-06-14 DIAGNOSIS — Z713 Dietary counseling and surveillance: Secondary | ICD-10-CM | POA: Insufficient documentation

## 2014-06-14 NOTE — Patient Instructions (Signed)
Plan:  We have adjusted your basal rates down to 0.95 for all 24 hours to address hypoglycemia especially during the night Consider entering all BG info into pump to allow it to correct more consistently  Consider using the food group concept as additional tool for carb counting. Review length of your cannula so you can program the cannula fill amount we discussed Text me when you upload within the next week (Thursday night) so we can evaluate the reports accordingly

## 2014-06-17 NOTE — Progress Notes (Signed)
Diabetes Self-Management Education  Visit Type:  Initial  Appt. Start Time: 0930 Appt. End Time: 1100  06/17/2014  Maria Conley, identified by name and date of birth, is a 48 y.o. female with a diagnosis of Diabetes: Type 1.  Other people present during visit:  Patient   ASSESSMENT  Height 5\' 5"  (1.651 m), weight 195 lb 11.2 oz (88.769 kg). Body mass index is 32.57 kg/(m^2).  Initial Visit Information:  Are you currently following a meal plan?: No   Are you taking your medications as prescribed?: Yes Are you checking your feet?: Yes How many days per week are you checking your feet?: 1 How often do you need to have someone help you when you read instructions, pamphlets, or other written materials from your doctor or pharmacy?: 1 - Never What is the last grade level you completed in school?: Associate degree  Psychosocial:     Patient Belief/Attitude about Diabetes: Motivated to manage diabetes Self-care barriers: None Self-management support: CDE visits Other persons present: Patient Patient Concerns: Problem Solving;Medication;Monitoring (pump and CGM use) Special Needs: None Preferred Learning Style: Auditory;Visual;Hands on Learning Readiness: Ready  Complications:   Last HgB A1C per patient/outside source: 6.5 mg/dL How often do you check your blood sugar?: 3-4 times/day Have you had a dilated eye exam in the past 12 months?: Yes Have you had a dental exam in the past 12 months?: Yes  Diet Intake:     Exercise:  Exercise: Light (walking ) Light Exercise amount of time (min / week): 150  Individualized Plan for Diabetes Self-Management Training:   Learning Objective:  Patient will have a greater understanding of diabetes self-management.  Patient education plan per assessed needs and concerns is to attend individual sessions for  pump and CGM management of DM 1   Education Topics Reviewed with Patient Today:    Carbohydrate counting Identified  with patient nutritional and/or medication changes necessary with exercise. Other (comment) (Reviewed pump reports with patient and discussed in detail, see pump seciton below) Taught/discussed recording of test results and interpretation of SMBG. Discussed and identified patients' treatment of hyperglycemia.          PATIENTS GOALS/Plan (Developed by the patient):  Nutrition: General guidelines for healthy choices and portions discussed Medications: Other (comment) (use Bolus Wizard to determine and deliver meal time and correction insulin doses more often) Monitoring : test blood glucose pre and post meals as discussed Reducing Risk: examine blood glucose patterns  Plan:   Patient Instructions  Plan:  We have adjusted your basal rates down to 0.95 for all 24 hours to address hypoglycemia especially during the night Consider entering all BG info into pump to allow it to correct more consistently  Consider using the food group concept as additional tool for carb counting. Review length of your cannula so you can program the cannula fill amount we discussed Text me when you upload within the next week (Thursday night) so we can evaluate the reports accordingly      Expected Outcomes:  Demonstrated interest in learning. Expect positive outcomes  Education material provided: Support group flyer, [employer] [facility] Pro Reports  If problems or questions, patient to contact team via:  Email  Future DSME appointment: 4-6 wks (to evaluate new pump settings and CGM data)

## 2014-06-30 ENCOUNTER — Telehealth: Payer: Self-pay | Admitting: *Deleted

## 2014-06-30 NOTE — Telephone Encounter (Signed)
  Pump Follow Up Progress Note by phone call  Orders received from MD giving me permission to make insulin pump adjustments for this patient.  Reviewed blood glucose logs on 06/30/2014 via: [facility]  and found the following:               Hypoglycemia Hyperglycemia Comments  Overnight Period:  YES  BASAL  Pre-Meal:    Breakfast YES  BASAL/ ISF   Lunch YES  BASAL/ ISF   Supper  YES BASAL  Post-Meal: Breakfast      Lunch  YES BASAL   Supper     Bedtime:  YES  ISF   Comments: Patient is doing much better using the Bolus Wizard as requested. She is putting all BG's into pump and using Bolus Wizard to direct her to how much insulin to take. However, her incidence of hypoglycemia has increased, mostly occuring after she corrects a high BG. So I plan to soften her Insulin Sensitivity Factor to prevent this. She is also having frequent hypoglycemia between midnight and noon. She is having hyperglycemia in the afternoons and by supper time. I plan to decrease her Basal Rates in AM and increase them in the afternoon per below.  Pump Settings:     Target: 110 mg/dl Date: Current Date: 16/10/960410/28/2015 changes in bold print   Basal Rate: Carb Ratio Sensitivity  Basal Rate: Carb Ratio Sensitivity   MN: 0.95 15 50 MN: 0.80  (-) 15 60  (-)      12 N: 1.05  (+)                                                  Plan: Patient to continue using the Bolus Wizard as discussed. Please upload again in 1 week to allow me to evaluate results of these changes. Call me if any questions or concerns.

## 2014-07-23 ENCOUNTER — Other Ambulatory Visit: Payer: Self-pay | Admitting: Nurse Practitioner

## 2014-08-09 ENCOUNTER — Telehealth: Payer: Self-pay | Admitting: Nurse Practitioner

## 2014-08-10 NOTE — Telephone Encounter (Signed)
refaxed 08/07/14

## 2014-08-12 ENCOUNTER — Encounter: Payer: Self-pay | Admitting: Nurse Practitioner

## 2014-08-12 ENCOUNTER — Ambulatory Visit (INDEPENDENT_AMBULATORY_CARE_PROVIDER_SITE_OTHER): Payer: 59 | Admitting: Nurse Practitioner

## 2014-08-12 VITALS — BP 121/75 | HR 83 | Temp 98.5°F | Ht 65.0 in | Wt 191.0 lb

## 2014-08-12 DIAGNOSIS — E109 Type 1 diabetes mellitus without complications: Secondary | ICD-10-CM

## 2014-08-12 DIAGNOSIS — I119 Hypertensive heart disease without heart failure: Secondary | ICD-10-CM

## 2014-08-12 DIAGNOSIS — E785 Hyperlipidemia, unspecified: Secondary | ICD-10-CM

## 2014-08-12 LAB — POCT GLYCOSYLATED HEMOGLOBIN (HGB A1C)

## 2014-08-12 LAB — POCT UA - MICROALBUMIN: Microalbumin Ur, POC: 20 mg/L

## 2014-08-12 NOTE — Progress Notes (Signed)
Subjective:    Patient ID: Maria Conley, female    DOB: [birth year], 48 y.o.   MRN: [id number]  HPI: Patient here for a follow up on chronic medical problems.  Has met with th ediabetic educator for the last several months and blood sugars have been improving.  Diabetes She presents for her follow-up diabetic visit. She has type 2 diabetes mellitus. No MedicAlert identification noted. Her disease course has been stable. There are no diabetic associated symptoms. Pertinent negatives for diabetes include no chest pain, no fatigue, no polydipsia and no polyphagia. Symptoms are stable. There are no diabetic complications. Risk factors for coronary artery disease include dyslipidemia, diabetes mellitus and hypertension. Current diabetic treatment includes insulin pump. She is compliant with treatment most of the time. Her weight is stable. She has not had a previous visit with a dietitian. She participates in exercise every other day. Her breakfast blood glucose is taken between 9-10 am. Her breakfast blood glucose range is generally 130-140 mg/dl. Her dinner blood glucose is taken between 7-8 pm. Her dinner blood glucose range is generally 130-140 mg/dl. Her highest blood glucose is >200 mg/dl. Her overall blood glucose range is 130-140 mg/dl. An ACE inhibitor/angiotensin II receptor blocker is being taken. She does not see a podiatrist.Eye exam is not current.  Hypertension This is a chronic problem. The current episode started more than 1 year ago. The problem is controlled. Pertinent negatives include no chest pain or shortness of breath. Risk factors for coronary artery disease include diabetes mellitus, dyslipidemia and post-menopausal state. Past treatments include diuretics and ACE inhibitors. The current treatment provides moderate improvement. Compliance problems include diet and exercise.   Hyperlipidemia This is a chronic problem. The current episode started more than 1 year ago. The problem  is uncontrolled. Recent lipid tests were reviewed and are high. Exacerbating diseases include diabetes. She has no history of hypothyroidism or obesity. Pertinent negatives include no chest pain or shortness of breath. Current antihyperlipidemic treatment includes statins. The current treatment provides moderate improvement of lipids. Compliance problems include adherence to diet and adherence to exercise.  Risk factors for coronary artery disease include diabetes mellitus, dyslipidemia, hypertension and post-menopausal.      Review of Systems  Constitutional: Negative for fatigue.  Respiratory: Negative for shortness of breath.   Cardiovascular: Negative for chest pain.  Endocrine: Negative for polydipsia and polyphagia.  Musculoskeletal:       Right elbow pain for 1 year  Skin: Negative.   All other systems reviewed and are negative.      Objective:   Physical Exam  Constitutional: She is oriented to person, place, and time. She appears well-developed and well-nourished.  HENT:  Nose: Nose normal.  Mouth/Throat: Oropharynx is clear and moist.  Eyes: EOM are normal.  Neck: Trachea normal, normal range of motion and full passive range of motion without pain. Neck supple. No JVD present. Carotid bruit is not present. No thyromegaly present.  Cardiovascular: Normal rate, regular rhythm, normal heart sounds and intact distal pulses.  Exam reveals no gallop and no friction rub.   No murmur heard. Pulmonary/Chest: Effort normal and breath sounds normal.  Abdominal: Soft. Bowel sounds are normal. She exhibits no distension and no mass. There is no tenderness.  Musculoskeletal: Normal range of motion.  Lymphadenopathy:    She has no cervical adenopathy.  Neurological: She is alert and oriented to person, place, and time. She has normal reflexes.  Skin: Skin is warm and dry.  Psychiatric: She  has a normal mood and affect. Her behavior is normal. Judgment and thought content normal.      BP 121/75 mmHg  Pulse 83  Temp(Src) 98.5 F (36.9 C) (Oral)  Ht _0  (1.651 m)  Wt 191 lb (86.637 kg)  BMI 31.78 kg/m2  Results for orders placed or performed in visit on 08/12/14  POCT glycosylated hemoglobin (Hb A1C)  Result Value Ref Range   Hemoglobin A1C 6.6%   POCT UA - Microalbumin  Result Value Ref Range   Microalbumin Ur, POC 20 mg/L    Assessment & Plan:   1. Hyperlipidemia with target LDL less than 100 Low fat diet - NMR, lipoprofile  2. Type 1 diabetes mellitus without complication Continue carb counting - POCT glycosylated hemoglobin (Hb A1C) - POCT UA - Microalbumin - Microalbumin, urine  3. Benign hypertensive heart disease without heart failure Do  Not add salt to diet - CMP14+EGFR   Will schedule mammogram Labs pending Health maintenance reviewed Diet and exercise encouraged Continue all meds Follow up  In 3 months   [place], FNP

## 2014-08-12 NOTE — Patient Instructions (Signed)

## 2014-08-13 LAB — CMP14+EGFR
ALT: 10 IU/L (ref 0–32)
AST: 9 IU/L (ref 0–40)
Albumin/Globulin Ratio: 2 (ref 1.1–2.5)
Albumin: 4.3 g/dL (ref 3.5–5.5)
Alkaline Phosphatase: 51 IU/L (ref 39–117)
BILIRUBIN TOTAL: 0.5 mg/dL (ref 0.0–1.2)
BUN/Creatinine Ratio: 15 (ref 9–23)
BUN: 13 mg/dL (ref 6–24)
CO2: 26 mmol/L (ref 18–29)
Calcium: 9.4 mg/dL (ref 8.7–10.2)
Chloride: 96 mmol/L — ABNORMAL LOW (ref 97–108)
Creatinine, Ser: 0.88 mg/dL (ref 0.57–1.00)
GFR calc non Af Amer: 77 mL/min/{1.73_m2} (ref >59)
GFR, EST AFRICAN AMERICAN: 89 mL/min/{1.73_m2} (ref >59)
GLUCOSE: 224 mg/dL — AB (ref 65–99)
Globulin, Total: 2.1 g/dL (ref 1.5–4.5)
POTASSIUM: 4.7 mmol/L (ref 3.5–5.2)
Sodium: 135 mmol/L (ref 134–144)
TOTAL PROTEIN: 6.4 g/dL (ref 6.0–8.5)

## 2014-08-13 LAB — NMR, LIPOPROFILE
Cholesterol: 191 mg/dL (ref 100–199)
HDL Cholesterol by NMR: 56 mg/dL (ref >39)
HDL Particle Number: 29.7 umol/L — ABNORMAL LOW (ref >=30.5)
LDL Particle Number: 1307 nmol/L — ABNORMAL HIGH (ref <1000)
LDL SIZE: 21.2 nm (ref >20.5)
LDL-C: 124 mg/dL — ABNORMAL HIGH (ref 0–99)
LP-IR SCORE: 26 (ref <=45)
Small LDL Particle Number: 285 nmol/L (ref <=527)
Triglycerides by NMR: 57 mg/dL (ref 0–149)

## 2014-08-13 LAB — MICROALBUMIN, URINE: MICROALBUM., U, RANDOM: 3.3 ug/mL (ref 0.0–17.0)

## 2014-10-11 ENCOUNTER — Other Ambulatory Visit: Payer: Self-pay | Admitting: Nurse Practitioner

## 2014-10-13 ENCOUNTER — Other Ambulatory Visit (HOSPITAL_COMMUNITY): Payer: Self-pay | Admitting: Obstetrics and Gynecology

## 2014-10-13 DIAGNOSIS — Z0289 Encounter for other administrative examinations: Secondary | ICD-10-CM

## 2014-10-28 ENCOUNTER — Other Ambulatory Visit: Payer: Self-pay | Admitting: Obstetrics and Gynecology

## 2014-10-28 DIAGNOSIS — N6310 Unspecified lump in the right breast, unspecified quadrant: Secondary | ICD-10-CM

## 2014-11-04 ENCOUNTER — Ambulatory Visit
Admission: RE | Admit: 2014-11-04 | Discharge: 2014-11-04 | Disposition: A | Discharge status: Home / Self Care | Payer: 59 | Source: Ambulatory Visit | Attending: Obstetrics and Gynecology | Admitting: Obstetrics and Gynecology

## 2014-11-04 DIAGNOSIS — N6310 Unspecified lump in the right breast, unspecified quadrant: Secondary | ICD-10-CM

## 2014-11-10 ENCOUNTER — Ambulatory Visit (INDEPENDENT_AMBULATORY_CARE_PROVIDER_SITE_OTHER): Payer: Self-pay | Admitting: Family Medicine

## 2014-11-10 VITALS — BP 112/66 | Ht 65.0 in | Wt 189.0 lb

## 2014-11-10 DIAGNOSIS — E108 Type 1 diabetes mellitus with unspecified complications: Secondary | ICD-10-CM

## 2014-11-11 ENCOUNTER — Ambulatory Visit (INDEPENDENT_AMBULATORY_CARE_PROVIDER_SITE_OTHER): Payer: 59 | Admitting: Nurse Practitioner

## 2014-11-11 ENCOUNTER — Encounter: Payer: Self-pay | Admitting: Nurse Practitioner

## 2014-11-11 VITALS — BP 128/79 | HR 88 | Temp 97.9°F | Ht 65.0 in | Wt 187.0 lb

## 2014-11-11 DIAGNOSIS — E785 Hyperlipidemia, unspecified: Secondary | ICD-10-CM

## 2014-11-11 DIAGNOSIS — E109 Type 1 diabetes mellitus without complications: Secondary | ICD-10-CM

## 2014-11-11 DIAGNOSIS — I119 Hypertensive heart disease without heart failure: Secondary | ICD-10-CM | POA: Diagnosis not present

## 2014-11-11 LAB — POCT GLYCOSYLATED HEMOGLOBIN (HGB A1C): Hemoglobin A1C: 6.7

## 2014-11-11 MED ORDER — CRESTOR 10 MG PO TABS: 10.0000 mg | ORAL_TABLET | Freq: Every day | ORAL | Status: DC

## 2014-11-11 MED ORDER — LISINOPRIL-HYDROCHLOROTHIAZIDE 20-12.5 MG PO TABS: 1.0000 | ORAL_TABLET | Freq: Every day | ORAL | Status: DC

## 2014-11-11 NOTE — Patient Instructions (Signed)
Exercise to Stay Healthy Exercise helps you become and stay healthy. EXERCISE IDEAS AND TIPS Choose exercises that:  You enjoy.  Fit into your day. You do not need to exercise really hard to be healthy. You can do exercises at a slow or medium level and stay healthy. You can:  Stretch before and after working out.  Try yoga, Pilates, or tai chi.  Lift weights.  Walk fast, swim, jog, run, climb stairs, bicycle, dance, or rollerskate.  Take aerobic classes. Exercises that burn about 150 calories:  Running 1  miles in 15 minutes.  Playing volleyball for 45 to 60 minutes.  Washing and waxing a car for 45 to 60 minutes.  Playing touch football for 45 minutes.  Walking 1  miles in 35 minutes.  Pushing a stroller 1  miles in 30 minutes.  Playing basketball for 30 minutes.  Raking leaves for 30 minutes.  Bicycling 5 miles in 30 minutes.  Walking 2 miles in 30 minutes.  Dancing for 30 minutes.  Shoveling snow for 15 minutes.  Swimming laps for 20 minutes.  Walking up stairs for 15 minutes.  Bicycling 4 miles in 15 minutes.  Gardening for 30 to 45 minutes.  Jumping rope for 15 minutes.  Washing windows or floors for 45 to 60 minutes. Document Released: 09/22/2010 Document Revised: 11/12/2011 Document Reviewed: 09/22/2010 ExitCare Patient Information 2015 [employer]. This information is not intended to replace advice given to you by your health care provider. Make sure you discuss any questions you have with your health care provider.  

## 2014-11-11 NOTE — Progress Notes (Signed)
Patient presents today for 3 month diabetes follow-up as part of the employer-sponsored [employer] [facility] program. She is normally managed by Lyna PoserMonica Wilson, PharmD and will return to her care at next visit. Current diabetes regimen includes Humalog via pump. Patient also continues on daily ASA, ACEi, and statin. Most recent MD follow-up was November with PCP, will follow-up again tomorrow for 4 month follow-up. She also recently saw Bev Paddock for pump setting changes. She is working closely with Bev for further adjustments. No med changes or major health changes at this time. Of note, patient recently had oral surgery and has been healing for approximately 4 weeks. This has impacted her diet and her diabetes care.  Diabetes Assessment: Type of Diabetes: Type 1; Year of diagnosis 1991; Sees Diabetes provider 2 times per year; MD managing Diabetes Paulene FloorMary Martin FNP; takes medications as prescribed; checks feet daily; takes an aspirin a day; Current Diabetes related medical conditions are Heart problems, High blood pressure, High cholesterol; checks blood glucose 3-4 times a day; Highest CBG 467; Lowest CBG 46; hypoglycemia frequency often; 14 day CBG average 164 Other Diabetes History: Current med regimen includes Humalog via pump. Patient uses Medtronic pump with CGM, and [employer] Contour Link meter. Patient does maintain good medication compliance. Patient did bring meter today and is currently testing 3-4 times per day. Glucose monitoring occurs fasting/pre-prandial/post-prandial/bedtime/when symptomatic. 14 day average is 164 with 44 total readings, 18 normal, 18 high, and 8 low. Hypoglycemia is frequent at this time but patient continues working with CDE for pump adjustments. Patient does demonstrate appropriate correction of hypoglycemia. Patient denies signs and symptoms of neuropathy including numbness/tingling/burning and symptoms of foot infection. Patient is due for yearly eye exam in April and has an apt  scheduled.  Lifestyle Factors: Diet - Patient underwent oral surgery Feb 12th, and since this time has eaten mainly soft foods which has included many high carb foods. She continues counting carbs and dosing appropriately. Prior to dental surgery she reports maintaining a healthy diet and controlling portions. She will get back on track as soon as able.  Exercise - Pt does a great job with exercise and walks 1 mile, 5 days per week  Plan: 1) Resume normal diet once able, attempt to count carbs with each meal 2) Continue exercising regularly, great job with maintaining this 3) Continue testing regularly 4) [facility] on Wednesday June 8th @ 8:00 am

## 2014-11-11 NOTE — Progress Notes (Signed)
Subjective:    Patient ID: Maria Conley, female    DOB: 05/12/1966, 48 y.o.   MRN: [id number]  HPI: Patient here for a follow up on chronic medical problems.   Diabetes She presents for her follow-up diabetic visit. She has type 2 diabetes mellitus. No MedicAlert identification noted. Her disease course has been stable. There are no diabetic associated symptoms. Pertinent negatives for diabetes include no chest pain, no fatigue, no polydipsia and no polyphagia. Symptoms are stable. There are no diabetic complications. Risk factors for coronary artery disease include dyslipidemia, diabetes mellitus and hypertension. Current diabetic treatment includes insulin pump. She is compliant with treatment most of the time. Her weight is stable. She has not had a previous visit with a dietitian. She participates in exercise every other day. Her breakfast blood glucose is taken between 9-10 am. Her breakfast blood glucose range is generally 130-140 mg/dl. Her dinner blood glucose is taken between 7-8 pm. Her dinner blood glucose range is generally 130-140 mg/dl. Her highest blood glucose is >200 mg/dl. Her overall blood glucose range is 130-140 mg/dl. An ACE inhibitor/angiotensin II receptor blocker is being taken. She does not see a podiatrist.Eye exam is not current.  Hypertension This is a chronic problem. The current episode started more than 1 year ago. The problem is controlled. Pertinent negatives include no chest pain or shortness of breath. Risk factors for coronary artery disease include diabetes mellitus, dyslipidemia and post-menopausal state. Past treatments include diuretics and ACE inhibitors. The current treatment provides moderate improvement. Compliance problems include diet and exercise.   Hyperlipidemia This is a chronic problem. The current episode started more than 1 year ago. The problem is uncontrolled. Recent lipid tests were reviewed and are high. Exacerbating diseases include  diabetes. She has no history of hypothyroidism or obesity. Pertinent negatives include no chest pain or shortness of breath. Current antihyperlipidemic treatment includes statins. The current treatment provides moderate improvement of lipids. Compliance problems include adherence to diet and adherence to exercise.  Risk factors for coronary artery disease include diabetes mellitus, dyslipidemia, hypertension and post-menopausal.      Review of Systems  Constitutional: Negative for fatigue.  Respiratory: Negative for shortness of breath.   Cardiovascular: Negative for chest pain.  Endocrine: Negative for polydipsia and polyphagia.  Musculoskeletal:       Right elbow pain for 1 year  Skin: Negative.   All other systems reviewed and are negative.      Objective:   Physical Exam  Constitutional: She is oriented to person, place, and time. She appears well-developed and well-nourished.  HENT:  Nose: Nose normal.  Mouth/Throat: Oropharynx is clear and moist.  Eyes: EOM are normal.  Neck: Trachea normal, normal range of motion and full passive range of motion without pain. Neck supple. No JVD present. Carotid bruit is not present. No thyromegaly present.  Cardiovascular: Normal rate, regular rhythm, normal heart sounds and intact distal pulses.  Exam reveals no gallop and no friction rub.   No murmur heard. Pulmonary/Chest: Effort normal and breath sounds normal.  Abdominal: Soft. Bowel sounds are normal. She exhibits no distension and no mass. There is no tenderness.  Musculoskeletal: Normal range of motion.  Lymphadenopathy:    She has no cervical adenopathy.  Neurological: She is alert and oriented to person, place, and time. She has normal reflexes.  Skin: Skin is warm and dry.  Psychiatric: She has a normal mood and affect. Her behavior is normal. Judgment and thought content normal.      BP 128/79 mmHg  Pulse 88  Temp(Src) 97.9 F (36.6 C) (Oral)  Ht $R'5\' 5"'JX$  (1.651 m)  Wt 187 lb  (84.823 kg)  BMI 31.12 kg/m2   Results for orders placed or performed in visit on 11/11/14  POCT glycosylated hemoglobin (Hb A1C)  Result Value Ref Range   Hemoglobin A1C 6.7%         Assessment & Plan:    1. Type 1 diabetes mellitus without complication Continue to count carbs Continue regular meetings with dietician - POCT glycosylated hemoglobin (Hb A1C)  2. Hyperlipidemia with target LDL less than 100 Low fat diet - NMR, lipoprofile - CRESTOR 10 MG tablet; Take 1 tablet (10 mg total) by mouth daily.  Dispense: 90 tablet; Refill: 1  3. Benign hypertensive heart disease without heart failure Do not add slat to diet - CMP14+EGFR - lisinopril-hydrochlorothiazide (PRINZIDE,ZESTORETIC) 20-12.5 MG per tablet; Take 1 tablet by mouth daily.  Dispense: 90 tablet; Refill: 1    Labs pending Health maintenance reviewed Diet and exercise encouraged Continue all meds Follow up  In 3 months   [place], FNP

## 2014-11-12 LAB — CMP14+EGFR
A/G RATIO: 2.5 (ref 1.1–2.5)
ALT: 12 IU/L (ref 0–32)
AST: 11 IU/L (ref 0–40)
Albumin: 4.2 g/dL (ref 3.5–5.5)
Alkaline Phosphatase: 55 IU/L (ref 39–117)
BUN / CREAT RATIO: 10 (ref 9–23)
BUN: 8 mg/dL (ref 6–24)
Bilirubin Total: 0.5 mg/dL (ref 0.0–1.2)
CO2: 23 mmol/L (ref 18–29)
CREATININE: 0.82 mg/dL (ref 0.57–1.00)
Calcium: 9.2 mg/dL (ref 8.7–10.2)
Chloride: 99 mmol/L (ref 97–108)
GFR calc Af Amer: 96 mL/min/{1.73_m2} (ref >59)
GFR calc non Af Amer: 84 mL/min/{1.73_m2} (ref >59)
Globulin, Total: 1.7 g/dL (ref 1.5–4.5)
Glucose: 251 mg/dL — ABNORMAL HIGH (ref 65–99)
POTASSIUM: 3.9 mmol/L (ref 3.5–5.2)
SODIUM: 137 mmol/L (ref 134–144)
Total Protein: 5.9 g/dL — ABNORMAL LOW (ref 6.0–8.5)

## 2014-11-12 LAB — NMR, LIPOPROFILE
CHOLESTEROL: 146 mg/dL (ref 100–199)
HDL Cholesterol by NMR: 52 mg/dL (ref >39)
HDL PARTICLE NUMBER: 30.8 umol/L (ref >=30.5)
LDL PARTICLE NUMBER: 860 nmol/L (ref <1000)
LDL SIZE: 20.8 nm (ref >20.5)
LDL-C: 77 mg/dL (ref 0–99)
LP-IR Score: 39 (ref <=45)
SMALL LDL PARTICLE NUMBER: 155 nmol/L (ref <=527)
Triglycerides by NMR: 84 mg/dL (ref 0–149)

## 2014-12-07 NOTE — Progress Notes (Signed)
ATTENDING PHYSICIAN NOTE: I have reviewed the chart and agree with the plan as detailed above. Giovanne Nickolson MD Pager [phone]  

## 2015-03-02 ENCOUNTER — Encounter: Payer: Self-pay | Admitting: Nurse Practitioner

## 2015-03-02 ENCOUNTER — Ambulatory Visit (INDEPENDENT_AMBULATORY_CARE_PROVIDER_SITE_OTHER): Payer: 59 | Admitting: Nurse Practitioner

## 2015-03-02 VITALS — BP 108/69 | HR 81 | Temp 97.6°F | Ht 65.0 in | Wt 185.4 lb

## 2015-03-02 DIAGNOSIS — Z720 Tobacco use: Secondary | ICD-10-CM

## 2015-03-02 DIAGNOSIS — E785 Hyperlipidemia, unspecified: Secondary | ICD-10-CM

## 2015-03-02 DIAGNOSIS — E109 Type 1 diabetes mellitus without complications: Secondary | ICD-10-CM

## 2015-03-02 DIAGNOSIS — I119 Hypertensive heart disease without heart failure: Secondary | ICD-10-CM

## 2015-03-02 LAB — POCT GLYCOSYLATED HEMOGLOBIN (HGB A1C): Hemoglobin A1C: 7.1

## 2015-03-02 MED ORDER — CRESTOR 10 MG PO TABS: 10.0000 mg | ORAL_TABLET | Freq: Every day | ORAL | Status: DC

## 2015-03-02 MED ORDER — LISINOPRIL-HYDROCHLOROTHIAZIDE 20-12.5 MG PO TABS: 1.0000 | ORAL_TABLET | Freq: Every day | ORAL | Status: DC

## 2015-03-02 NOTE — Progress Notes (Signed)
Subjective:    Patient ID: Maria Conley, female    DOB: 03/06/1966, 49 y.o.   MRN: [id number]  HPI: Patient here for a follow up on chronic medical problems.   Diabetes She presents for her follow-up diabetic visit. She has type 2 diabetes mellitus. No MedicAlert identification noted. Her disease course has been stable. There are no diabetic associated symptoms. Pertinent negatives for diabetes include no chest pain, no fatigue, no polydipsia and no polyphagia. Symptoms are stable. There are no diabetic complications. Risk factors for coronary artery disease include dyslipidemia, diabetes mellitus and hypertension. Current diabetic treatment includes insulin pump. She is compliant with treatment most of the time. Her weight is stable. She has not had a previous visit with a dietitian. She participates in exercise every other day. Her breakfast blood glucose is taken between 9-10 am. Her breakfast blood glucose range is generally 130-140 mg/dl. Her dinner blood glucose is taken between 7-8 pm. Her dinner blood glucose range is generally 130-140 mg/dl. Her highest blood glucose is >200 mg/dl. Her overall blood glucose range is 130-140 mg/dl. An ACE inhibitor/angiotensin II receptor blocker is being taken. She does not see a podiatrist.Eye exam is not current.  Hypertension This is a chronic problem. The current episode started more than 1 year ago. The problem is controlled. Pertinent negatives include no chest pain or shortness of breath. Risk factors for coronary artery disease include diabetes mellitus, dyslipidemia and post-menopausal state. Past treatments include diuretics and ACE inhibitors. The current treatment provides moderate improvement. Compliance problems include diet and exercise.   Hyperlipidemia This is a chronic problem. The current episode started more than 1 year ago. The problem is uncontrolled. Recent lipid tests were reviewed and are high. Exacerbating diseases include  diabetes. She has no history of hypothyroidism or obesity. Pertinent negatives include no chest pain or shortness of breath. Current antihyperlipidemic treatment includes statins. The current treatment provides moderate improvement of lipids. Compliance problems include adherence to diet and adherence to exercise.  Risk factors for coronary artery disease include diabetes mellitus, dyslipidemia, hypertension and post-menopausal.      Review of Systems  Constitutional: Negative for fatigue.  Respiratory: Negative for shortness of breath.   Cardiovascular: Negative for chest pain.  Endocrine: Negative for polydipsia and polyphagia.  Musculoskeletal:       Right elbow pain for 1 year  Skin: Negative.   All other systems reviewed and are negative.      Objective:   Physical Exam  Constitutional: She is oriented to person, place, and time. She appears well-developed and well-nourished.  HENT:  Nose: Nose normal.  Mouth/Throat: Oropharynx is clear and moist.  Eyes: EOM are normal.  Neck: Trachea normal, normal range of motion and full passive range of motion without pain. Neck supple. No JVD present. Carotid bruit is not present. No thyromegaly present.  Cardiovascular: Normal rate, regular rhythm, normal heart sounds and intact distal pulses.  Exam reveals no gallop and no friction rub.   No murmur heard. Pulmonary/Chest: Effort normal and breath sounds normal.  Abdominal: Soft. Bowel sounds are normal. She exhibits no distension and no mass. There is no tenderness.  Musculoskeletal: Normal range of motion.  Lymphadenopathy:    She has no cervical adenopathy.  Neurological: She is alert and oriented to person, place, and time. She has normal reflexes.  Skin: Skin is warm and dry.  Psychiatric: She has a normal mood and affect. Her behavior is normal. Judgment and thought content normal.      BP 108/69 mmHg  Pulse 81  Temp(Src) 97.6 F (36.4 C) (Oral)  Ht 5' 5" (1.651 m)  Wt 185 lb  6.4 oz (84.097 kg)  BMI 30.85 kg/m2  Results for orders placed or performed in visit on 03/02/15  POCT glycosylated hemoglobin (Hb A1C)  Result Value Ref Range   Hemoglobin A1C 7.1    1. Type 1 diabetes mellitus without complication Continue to watch carbs - POCT glycosylated hemoglobin (Hb A1C)  2. Hyperlipidemia with target LDL less than 100 Low fat diet - CMP14+EGFR - CRESTOR 10 MG tablet; Take 1 tablet (10 mg total) by mouth daily.  Dispense: 90 tablet; Refill: 1  3. Benign hypertensive heart disease without heart failure Do not add slt to diet - Lipid panel - lisinopril-hydrochlorothiazide (PRINZIDE,ZESTORETIC) 20-12.5 MG per tablet; Take 1 tablet by mouth daily.  Dispense: 90 tablet; Refill: 1  4. Tobacco abuse hx    Labs pending Health maintenance reviewed Diet and exercise encouraged Continue all meds Follow up  In 3 month   Mary-Margaret Martin, FNP    Assessment & Plan:     

## 2015-03-02 NOTE — Patient Instructions (Signed)
Fat and Cholesterol Control Diet Fat and cholesterol levels in your blood and organs are influenced by your diet. High levels of fat and cholesterol may lead to diseases of the heart, small and large blood vessels, gallbladder, liver, and pancreas. CONTROLLING FAT AND CHOLESTEROL WITH DIET Although exercise and lifestyle factors are important, your diet is key. That is because certain foods are known to raise cholesterol and others to lower it. The goal is to balance foods for their effect on cholesterol and more importantly, to replace saturated and trans fat with other types of fat, such as monounsaturated fat, polyunsaturated fat, and omega-3 fatty acids. On average, a person should consume no more than 15 to 17 g of saturated fat daily. Saturated and trans fats are considered "bad" fats, and they will raise LDL cholesterol. Saturated fats are primarily found in animal products such as meats, butter, and cream. However, that does not mean you need to give up all your favorite foods. Today, there are good tasting, low-fat, low-cholesterol substitutes for most of the things you like to eat. Choose low-fat or nonfat alternatives. Choose round or loin cuts of red meat. These types of cuts are lowest in fat and cholesterol. Chicken (without the skin), fish, veal, and ground turkey breast are great choices. Eliminate fatty meats, such as hot dogs and salami. Even shellfish have little or no saturated fat. Have a 3 oz (85 g) portion when you eat lean meat, poultry, or fish. Trans fats are also called "partially hydrogenated oils." They are oils that have been scientifically manipulated so that they are solid at room temperature resulting in a longer shelf life and improved taste and texture of foods in which they are added. Trans fats are found in stick margarine, some tub margarines, cookies, crackers, and baked goods.  When baking and cooking, oils are a great substitute for butter. The monounsaturated oils are  especially beneficial since it is believed they lower LDL and raise HDL. The oils you should avoid entirely are saturated tropical oils, such as coconut and palm.  Remember to eat a lot from food groups that are naturally free of saturated and trans fat, including fish, fruit, vegetables, beans, grains (barley, rice, couscous, bulgur wheat), and pasta (without cream sauces).  IDENTIFYING FOODS THAT LOWER FAT AND CHOLESTEROL  Soluble fiber may lower your cholesterol. This type of fiber is found in fruits such as apples, vegetables such as broccoli, potatoes, and carrots, legumes such as beans, peas, and lentils, and grains such as barley. Foods fortified with plant sterols (phytosterol) may also lower cholesterol. You should eat at least 2 g per day of these foods for a cholesterol lowering effect.  Read package labels to identify low-saturated fats, trans fat free, and low-fat foods at the supermarket. Select cheeses that have only 2 to 3 g saturated fat per ounce. Use a heart-healthy tub margarine that is free of trans fats or partially hydrogenated oil. When buying baked goods (cookies, crackers), avoid partially hydrogenated oils. Breads and muffins should be made from whole grains (whole-wheat or whole oat flour, instead of "flour" or "enriched flour"). Buy non-creamy canned soups with reduced salt and no added fats.  FOOD PREPARATION TECHNIQUES  Never deep-fry. If you must fry, either stir-fry, which uses very little fat, or use non-stick cooking sprays. When possible, broil, bake, or roast meats, and steam vegetables. Instead of putting butter or margarine on vegetables, use lemon and herbs, applesauce, and cinnamon (for squash and sweet potatoes). Use nonfat   yogurt, salsa, and low-fat dressings for salads.  LOW-SATURATED FAT / LOW-FAT FOOD SUBSTITUTES Meats / Saturated Fat (g)  Avoid: Steak, marbled (3 oz/85 g) / 11 g  Choose: Steak, lean (3 oz/85 g) / 4 g  Avoid: Hamburger (3 oz/85 g) / 7  g  Choose: Hamburger, lean (3 oz/85 g) / 5 g  Avoid: Ham (3 oz/85 g) / 6 g  Choose: Ham, lean cut (3 oz/85 g) / 2.4 g  Avoid: Chicken, with skin, dark meat (3 oz/85 g) / 4 g  Choose: Chicken, skin removed, dark meat (3 oz/85 g) / 2 g  Avoid: Chicken, with skin, light meat (3 oz/85 g) / 2.5 g  Choose: Chicken, skin removed, light meat (3 oz/85 g) / 1 g Dairy / Saturated Fat (g)  Avoid: Whole milk (1 cup) / 5 g  Choose: Low-fat milk, 2% (1 cup) / 3 g  Choose: Low-fat milk, 1% (1 cup) / 1.5 g  Choose: Skim milk (1 cup) / 0.3 g  Avoid: Hard cheese (1 oz/28 g) / 6 g  Choose: Skim milk cheese (1 oz/28 g) / 2 to 3 g  Avoid: Cottage cheese, 4% fat (1 cup) / 6.5 g  Choose: Low-fat cottage cheese, 1% fat (1 cup) / 1.5 g  Avoid: Ice cream (1 cup) / 9 g  Choose: Sherbet (1 cup) / 2.5 g  Choose: Nonfat frozen yogurt (1 cup) / 0.3 g  Choose: Frozen fruit bar / trace  Avoid: Whipped cream (1 tbs) / 3.5 g  Choose: Nondairy whipped topping (1 tbs) / 1 g Condiments / Saturated Fat (g)  Avoid: Mayonnaise (1 tbs) / 2 g  Choose: Low-fat mayonnaise (1 tbs) / 1 g  Avoid: Butter (1 tbs) / 7 g  Choose: Extra light margarine (1 tbs) / 1 g  Avoid: Coconut oil (1 tbs) / 11.8 g  Choose: Olive oil (1 tbs) / 1.8 g  Choose: Corn oil (1 tbs) / 1.7 g  Choose: Safflower oil (1 tbs) / 1.2 g  Choose: Sunflower oil (1 tbs) / 1.4 g  Choose: Soybean oil (1 tbs) / 2.4 g  Choose: Canola oil (1 tbs) / 1 g Document Released: 08/20/2005 Document Revised: 12/15/2012 Document Reviewed: 11/18/2013 [employer] Patient Information 2015 [employer]. This information is not intended to replace advice given to you by your health care provider. Make sure you discuss any questions you have with your health care provider.  

## 2015-03-03 LAB — LIPID PANEL
CHOLESTEROL TOTAL: 196 mg/dL (ref 100–199)
Chol/HDL Ratio: 3.5 ratio units (ref 0.0–4.4)
HDL: 56 mg/dL (ref >39)
LDL Calculated: 122 mg/dL — ABNORMAL HIGH (ref 0–99)
Triglycerides: 88 mg/dL (ref 0–149)
VLDL Cholesterol Cal: 18 mg/dL (ref 5–40)

## 2015-03-03 LAB — CMP14+EGFR
ALK PHOS: 56 IU/L (ref 39–117)
ALT: 9 IU/L (ref 0–32)
AST: 9 IU/L (ref 0–40)
Albumin/Globulin Ratio: 2 (ref 1.1–2.5)
Albumin: 4 g/dL (ref 3.5–5.5)
BUN / CREAT RATIO: 11 (ref 9–23)
BUN: 10 mg/dL (ref 6–24)
Bilirubin Total: 0.6 mg/dL (ref 0.0–1.2)
CO2: 24 mmol/L (ref 18–29)
Calcium: 9.1 mg/dL (ref 8.7–10.2)
Chloride: 97 mmol/L (ref 97–108)
Creatinine, Ser: 0.87 mg/dL (ref 0.57–1.00)
GFR calc Af Amer: 90 mL/min/{1.73_m2} (ref >59)
GFR, EST NON AFRICAN AMERICAN: 78 mL/min/{1.73_m2} (ref >59)
GLOBULIN, TOTAL: 2 g/dL (ref 1.5–4.5)
Glucose: 254 mg/dL — ABNORMAL HIGH (ref 65–99)
Potassium: 4.4 mmol/L (ref 3.5–5.2)
SODIUM: 136 mmol/L (ref 134–144)
TOTAL PROTEIN: 6 g/dL (ref 6.0–8.5)

## 2015-04-14 ENCOUNTER — Ambulatory Visit (HOSPITAL_COMMUNITY)
Admission: RE | Admit: 2015-04-14 | Discharge: 2015-04-14 | Disposition: A | Discharge status: Home / Self Care | Payer: 59 | Source: Ambulatory Visit | Attending: Family Medicine | Admitting: Family Medicine

## 2015-04-14 ENCOUNTER — Ambulatory Visit: Payer: 59 | Admitting: Family Medicine

## 2015-04-14 ENCOUNTER — Ambulatory Visit (INDEPENDENT_AMBULATORY_CARE_PROVIDER_SITE_OTHER): Payer: 59 | Admitting: Family Medicine

## 2015-04-14 ENCOUNTER — Encounter: Payer: Self-pay | Admitting: Family Medicine

## 2015-04-14 VITALS — BP 134/77 | HR 95 | Temp 99.2°F | Ht 65.0 in | Wt 187.8 lb

## 2015-04-14 DIAGNOSIS — Z9641 Presence of insulin pump (external) (internal): Secondary | ICD-10-CM | POA: Diagnosis not present

## 2015-04-14 DIAGNOSIS — R071 Chest pain on breathing: Secondary | ICD-10-CM | POA: Diagnosis not present

## 2015-04-14 DIAGNOSIS — E119 Type 2 diabetes mellitus without complications: Secondary | ICD-10-CM | POA: Diagnosis not present

## 2015-04-14 DIAGNOSIS — R0602 Shortness of breath: Secondary | ICD-10-CM | POA: Diagnosis not present

## 2015-04-14 DIAGNOSIS — Z794 Long term (current) use of insulin: Secondary | ICD-10-CM | POA: Diagnosis not present

## 2015-04-14 LAB — POCT I-STAT CREATININE: CREATININE: 0.7 mg/dL (ref 0.44–1.00)

## 2015-04-14 MED ORDER — IOHEXOL 350 MG/ML SOLN
100.0000 mL | Freq: Once | INTRAVENOUS | Status: AC | PRN
  Administered 2015-04-14: 100 mL via INTRAVENOUS

## 2015-04-14 NOTE — Progress Notes (Signed)
BP 134/77 mmHg  Pulse 95  Temp(Src) 99.2 F (37.3 C) (Oral)  Ht '5\' 5"'  (1.651 m)  Wt 187 lb 12.8 oz (85.186 kg)  BMI 31.25 kg/m2  SpO2 99%   Subjective:    Patient ID: Maria Conley, female    DOB: 09/22/1965, 49 y.o.   MRN: [id number]  HPI: Maria Conley is a 49 y.o. female presenting on 04/14/2015 for Shortness of Breath   HPI Chest pressure Patient presents today with this vague chest pressure that she has been having for the past 2-3 days. The pressure is more just a sensation of tightness like she can't breathe all the way in. She has never experienced this before. She does have a positive history for smoking a third pack a day for 10 years which she quit 2 years ago. She denies any personal cardiac history. 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 She also has some tightness radiating up to her right shoulder blade. She also has a general feeling of heaviness and fatigue and lack of energy. She cannot really number this severity of the pain because it is a vague sensation. She also has a history of hypertension, and a history of a repaired atrial septal defect as a child. She also is a type I diabetic.  Relevant past medical, surgical, family and social history reviewed and updated as indicated. Interim medical history since our last visit reviewed. Allergies and medications reviewed and updated.  Review of Systems  Constitutional: Negative for fever and chills.  HENT: Negative for congestion, ear discharge and ear pain.   Eyes: Negative for redness and visual disturbance.  Respiratory: Positive for chest tightness and shortness of breath. Negative for cough, choking, wheezing and stridor.   Cardiovascular: Positive for chest pain. Negative for leg swelling.  Endocrine: Negative for cold intolerance and heat intolerance.  Genitourinary: Negative for dysuria and difficulty urinating.  Musculoskeletal:  Positive for back pain (right scapularnd shoulder pain). Negative for gait problem.  Skin: Negative for rash.  Neurological: Positive for weakness (generalized). Negative for dizziness, light-headedness and headaches.  Psychiatric/Behavioral: Negative for behavioral problems and agitation.  All other systems reviewed and are negative.   Per HPI unless specifically indicated above     Medication List       This list is accurate as of: 04/14/15  9:25 PM.  Always use your most recent med list.               aspirin EC 81 MG tablet  Take 81 mg by mouth daily.     CRESTOR 10 MG tablet  Generic drug:  rosuvastatin  Take 1 tablet (10 mg total) by mouth daily.     glucose blood test strip  Commonly known as:  BAYER CONTOUR NEXT TEST  Test 4-5X a day and prn     Dx 250.02     HUMALOG 100 UNIT/ML injection  Generic drug:  insulin lispro  USE IN INSULIN PUMP AS DIRECTED (AVERAGE 50 UNITS DAILY)     lisinopril-hydrochlorothiazide 20-12.5 MG per tablet  Commonly known as:  PRINZIDE,ZESTORETIC  Take 1 tablet by mouth daily.     MINIMED INFUSION SET-MMT 399 Misc  Use with insulin pump. Change infusion set q 3 days.     Vitamin D-3 5000 UNITS Tabs  Take 1 tablet by mouth.           Objective:  BP 134/77 mmHg  Pulse 95  Temp(Src) 99.2 F (37.3 C) (Oral)  Ht '5\' 5"'  (1.651 m)  Wt 187 lb 12.8 oz (85.186 kg)  BMI 31.25 kg/m2  SpO2 99%  Wt Readings from Last 3 Encounters:  04/14/15 187 lb 12.8 oz (85.186 kg)  03/02/15 185 lb 6.4 oz (84.097 kg)  11/11/14 187 lb (84.823 kg)    Physical Exam  Constitutional: She is oriented to person, place, and time. She appears well-developed and well-nourished. No distress.  Eyes: Conjunctivae and EOM are normal. Pupils are equal, round, and reactive to light.  Cardiovascular: Normal rate and regular rhythm.   No murmur heard. Pulmonary/Chest: Effort normal and breath sounds normal. No respiratory distress. She has no wheezes.    Abdominal: Soft. Bowel sounds are normal. She exhibits no distension. There is no tenderness.  Musculoskeletal: Normal range of motion. She exhibits no edema or tenderness.  Neurological: She is alert and oriented to person, place, and time. Coordination normal.  Skin: Skin is warm and dry. No rash noted. She is not diaphoretic.  Psychiatric: She has a normal mood and affect. Her behavior is normal.  Vitals reviewed.   Results for orders placed or performed in visit on 03/02/15  CMP14+EGFR  Result Value Ref Range   Glucose 254 (H) 65 - 99 mg/dL   BUN 10 6 - 24 mg/dL   Creatinine, Ser 0.87 0.57 - 1.00 mg/dL   GFR calc non Af Amer 78 >59 mL/min/1.73   GFR calc Af Amer 90 >59 mL/min/1.73   BUN/Creatinine Ratio 11 9 - 23   Sodium 136 134 - 144 mmol/L   Potassium 4.4 3.5 - 5.2 mmol/L   Chloride 97 97 - 108 mmol/L   CO2 24 18 - 29 mmol/L   Calcium 9.1 8.7 - 10.2 mg/dL   Total Protein 6.0 6.0 - 8.5 g/dL   Albumin 4.0 3.5 - 5.5 g/dL   Globulin, Total 2.0 1.5 - 4.5 g/dL   Albumin/Globulin Ratio 2.0 1.1 - 2.5   Bilirubin Total 0.6 0.0 - 1.2 mg/dL   Alkaline Phosphatase 56 39 - 117 IU/L   AST 9 0 - 40 IU/L   ALT 9 0 - 32 IU/L  Lipid panel  Result Value Ref Range   Cholesterol, Total 196 100 - 199 mg/dL   Triglycerides 88 0 - 149 mg/dL   HDL 56 >39 mg/dL   VLDL Cholesterol Cal 18 5 - 40 mg/dL   LDL Calculated 122 (H) 0 - 99 mg/dL   Chol/HDL Ratio 3.5 0.0 - 4.4 ratio units  POCT glycosylated hemoglobin (Hb A1C)  Result Value Ref Range   Hemoglobin A1C 7.1   EKG: normal EKG, normal sinus rhythm, there are no previous tracings available for comparison, nonspecific ST and T waves changes.    Assessment & Plan:   Problem List Items Addressed This Visit      Other   Chest pain    Chest tightness with SOB of breath, EKG normal, will send for stat CT for PE protocol or to ED if can't schedule.      Relevant Orders   CT Angio Chest PE W/Cm &/Or Wo Cm (Completed)    Other Visit  Diagnoses    Shortness of breath    -  Primary    Relevant Orders    EKG 12-Lead (Completed)    CT Angio Chest PE W/Cm &/Or Wo Cm (Completed)        Follow up plan: Return in about  1 week (around 04/21/2015), or if symptoms worsen or fail to improve.  Caryl Pina, MD [facility] 04/14/2015, 9:25 PM

## 2015-04-14 NOTE — Assessment & Plan Note (Signed)
Chest tightness with SOB of breath, EKG normal, will send for stat CT for PE protocol or to ED if can't schedule.

## 2015-04-15 ENCOUNTER — Telehealth: Payer: Self-pay | Admitting: Family Medicine

## 2015-04-15 NOTE — Telephone Encounter (Signed)
Patient aware of results.

## 2015-04-20 ENCOUNTER — Other Ambulatory Visit: Payer: Self-pay | Admitting: Nurse Practitioner

## 2015-05-06 DIAGNOSIS — Z0289 Encounter for other administrative examinations: Secondary | ICD-10-CM

## 2015-05-12 ENCOUNTER — Ambulatory Visit (INDEPENDENT_AMBULATORY_CARE_PROVIDER_SITE_OTHER): Payer: Self-pay | Admitting: Family Medicine

## 2015-05-12 VITALS — BP 126/62 | HR 81 | Wt 189.0 lb

## 2015-05-12 DIAGNOSIS — E109 Type 1 diabetes mellitus without complications: Secondary | ICD-10-CM

## 2015-05-12 NOTE — Progress Notes (Signed)
Patient presents to pharmacy for 3 mo follow up DM as part of the employee sponsored [employer] to [employer]. Medications have been reviewed. I have also counseled patient on lifestyle interventions such as diet and exercise.   Diabetes-POC A1C 6.6, at goal. However, patient has frequent lows. Reviewed rule of 15 for hypoglycemia. I have asked patient to set up an appointment with be[facility] for a pump adjustment. She has CGM but doesn't wear it all the time. She will wear for 2 weeks and then call [facility] to review results. Filled humalog today.   Hypertension-at goal. Fill lisinopril/HCTZ today  Lipids- crestor is now generic but her prescription still says dispense as written. I will call provider to change to generic so patient can get for free. She is also spotty with compliance to this med because she feels she doesn't need it. Explained that her diabetes alone is an indication for a statin.   Patient has set a series of personal goals and will follow up in 3 months for further review of DM

## 2015-05-17 NOTE — Progress Notes (Signed)
Patient ID: Maria Conley, female   DOB: [birth year], 49 y.o.   MRN: [id number] ATTENDING PHYSICIAN NOTE: I have reviewed the chart and agree with the plan as detailed above. Denny Levy MD Pager [phone]

## 2015-05-18 ENCOUNTER — Other Ambulatory Visit: Payer: Self-pay | Admitting: Nurse Practitioner

## 2015-05-18 ENCOUNTER — Other Ambulatory Visit: Payer: Self-pay | Admitting: *Deleted

## 2015-05-18 DIAGNOSIS — E785 Hyperlipidemia, unspecified: Secondary | ICD-10-CM

## 2015-05-18 MED ORDER — ROSUVASTATIN CALCIUM 10 MG PO TABS: 10.0000 mg | ORAL_TABLET | Freq: Every day | ORAL | Status: DC

## 2015-05-23 ENCOUNTER — Other Ambulatory Visit: Payer: Self-pay | Admitting: Nurse Practitioner

## 2015-05-23 DIAGNOSIS — E785 Hyperlipidemia, unspecified: Secondary | ICD-10-CM

## 2015-05-23 MED ORDER — ROSUVASTATIN CALCIUM 10 MG PO TABS: 10.0000 mg | ORAL_TABLET | Freq: Every day | ORAL | Status: DC

## 2015-06-10 ENCOUNTER — Encounter: Payer: Self-pay | Admitting: Nurse Practitioner

## 2015-06-10 ENCOUNTER — Ambulatory Visit (INDEPENDENT_AMBULATORY_CARE_PROVIDER_SITE_OTHER): Payer: 59 | Admitting: Nurse Practitioner

## 2015-06-10 VITALS — BP 118/71 | HR 81 | Temp 98.9°F | Ht 65.0 in | Wt 190.0 lb

## 2015-06-10 DIAGNOSIS — E109 Type 1 diabetes mellitus without complications: Secondary | ICD-10-CM

## 2015-06-10 DIAGNOSIS — Z6831 Body mass index (BMI) 31.0-31.9, adult: Secondary | ICD-10-CM | POA: Diagnosis not present

## 2015-06-10 DIAGNOSIS — E785 Hyperlipidemia, unspecified: Secondary | ICD-10-CM

## 2015-06-10 DIAGNOSIS — I119 Hypertensive heart disease without heart failure: Secondary | ICD-10-CM | POA: Diagnosis not present

## 2015-06-10 LAB — POCT GLYCOSYLATED HEMOGLOBIN (HGB A1C): Hemoglobin A1C: 6.5

## 2015-06-10 MED ORDER — HUMALOG 100 UNIT/ML ~~LOC~~ SOLN: SUBCUTANEOUS | Status: DC

## 2015-06-10 MED ORDER — LISINOPRIL-HYDROCHLOROTHIAZIDE 20-12.5 MG PO TABS: 1.0000 | ORAL_TABLET | Freq: Every day | ORAL | Status: DC

## 2015-06-10 NOTE — Patient Instructions (Signed)
Diabetes and Foot Care Diabetes may cause you to have problems because of poor blood supply (circulation) to your feet and legs. This may cause the skin on your feet to become thinner, break easier, and heal more slowly. Your skin may become dry, and the skin may peel and crack. You may also have nerve damage in your legs and feet causing decreased feeling in them. You may not notice minor injuries to your feet that could lead to infections or more serious problems. Taking care of your feet is one of the most important things you can do for yourself.  HOME CARE INSTRUCTIONS  Wear shoes at all times, even in the house. Do not go barefoot. Bare feet are easily injured.  Check your feet daily for blisters, cuts, and redness. If you cannot see the bottom of your feet, use a mirror or ask someone for help.  Wash your feet with warm water (do not use hot water) and mild soap. Then pat your feet and the areas between your toes until they are completely dry. Do not soak your feet as this can dry your skin.  Apply a moisturizing lotion or petroleum jelly (that does not contain alcohol and is unscented) to the skin on your feet and to dry, brittle toenails. Do not apply lotion between your toes.  Trim your toenails straight across. Do not dig under them or around the cuticle. File the edges of your nails with an emery board or nail file.  Do not cut corns or calluses or try to remove them with medicine.  Wear clean socks or stockings every day. Make sure they are not too tight. Do not wear knee-high stockings since they may decrease blood flow to your legs.  Wear shoes that fit properly and have enough cushioning. To break in new shoes, wear them for just a few hours a day. This prevents you from injuring your feet. Always look in your shoes before you put them on to be sure there are no objects inside.  Do not cross your legs. This may decrease the blood flow to your feet.  If you find a minor scrape,  cut, or break in the skin on your feet, keep it and the skin around it clean and dry. These areas may be cleansed with mild soap and water. Do not cleanse the area with peroxide, alcohol, or iodine.  When you remove an adhesive bandage, be sure not to damage the skin around it.  If you have a wound, look at it several times a day to make sure it is healing.  Do not use heating pads or hot water bottles. They may burn your skin. If you have lost feeling in your feet or legs, you may not know it is happening until it is too late.  Make sure your health care provider performs a complete foot exam at least annually or more often if you have foot problems. Report any cuts, sores, or bruises to your health care provider immediately. SEEK MEDICAL CARE IF:   You have an injury that is not healing.  You have cuts or breaks in the skin.  You have an ingrown nail.  You notice redness on your legs or feet.  You feel burning or tingling in your legs or feet.  You have pain or cramps in your legs and feet.  Your legs or feet are numb.  Your feet always feel cold. SEEK IMMEDIATE MEDICAL CARE IF:   There is increasing redness,   swelling, or pain in or around a wound.  There is a red line that goes up your leg.  Pus is coming from a wound.  You develop a fever or as directed by your health care provider.  You notice a bad smell coming from an ulcer or wound.   This information is not intended to replace advice given to you by your health care provider. Make sure you discuss any questions you have with your health care provider.   Document Released: 08/17/2000 Document Revised: 04/22/2013 Document Reviewed: 01/27/2013 Else[employer] Interactive Patient Education 2016 [employer]  

## 2015-06-10 NOTE — Progress Notes (Signed)
Subjective:    Patient ID: Maria Conley, female    DOB: 09/16/1965, 48 y.o.   MRN: [id number]  HPI: Patient here for a follow up on chronic medical problems.   Diabetes She presents for her follow-up diabetic visit. She has type 2 diabetes mellitus. No MedicAlert identification noted. Her disease course has been stable. There are no diabetic associated symptoms. Pertinent negatives for diabetes include no chest pain, no fatigue, no polydipsia and no polyphagia. Symptoms are stable. There are no diabetic complications. Risk factors for coronary artery disease include dyslipidemia, diabetes mellitus and hypertension. Current diabetic treatment includes insulin pump. She is compliant with treatment most of the time. Her weight is stable. She has not had a previous visit with a dietitian. She participates in exercise every other day. Her breakfast blood glucose is taken between 9-10 am. Her breakfast blood glucose range is generally 130-140 mg/dl. Her dinner blood glucose is taken between 7-8 pm. Her dinner blood glucose range is generally 130-140 mg/dl. Her highest blood glucose is >200 mg/dl. Her overall blood glucose range is 130-140 mg/dl. An ACE inhibitor/angiotensin II receptor blocker is being taken. She does not see a podiatrist.Eye exam is not current.  Hypertension This is a chronic problem. The current episode started more than 1 year ago. The problem is controlled. Pertinent negatives include no chest pain or shortness of breath. Risk factors for coronary artery disease include diabetes mellitus, dyslipidemia and post-menopausal state. Past treatments include diuretics and ACE inhibitors. The current treatment provides moderate improvement. Compliance problems include diet and exercise.   Hyperlipidemia This is a chronic problem. The current episode started more than 1 year ago. The problem is uncontrolled. Recent lipid tests were reviewed and are high. Exacerbating diseases include  diabetes. She has no history of hypothyroidism or obesity. Pertinent negatives include no chest pain or shortness of breath. Current antihyperlipidemic treatment includes statins. The current treatment provides moderate improvement of lipids. Compliance problems include adherence to diet and adherence to exercise.  Risk factors for coronary artery disease include diabetes mellitus, dyslipidemia, hypertension and post-menopausal.      Review of Systems  Constitutional: Negative for fatigue.  Respiratory: Negative for shortness of breath.   Cardiovascular: Negative for chest pain.  Endocrine: Negative for polydipsia and polyphagia.  Musculoskeletal:       Right elbow pain for 1 year  Skin: Negative.   All other systems reviewed and are negative.      Objective:   Physical Exam  Constitutional: She is oriented to person, place, and time. She appears well-developed and well-nourished.  HENT:  Nose: Nose normal.  Mouth/Throat: Oropharynx is clear and moist.  Eyes: EOM are normal.  Neck: Trachea normal, normal range of motion and full passive range of motion without pain. Neck supple. No JVD present. Carotid bruit is not present. No thyromegaly present.  Cardiovascular: Normal rate, regular rhythm, normal heart sounds and intact distal pulses.  Exam reveals no gallop and no friction rub.   No murmur heard. Pulmonary/Chest: Effort normal and breath sounds normal.  Abdominal: Soft. Bowel sounds are normal. She exhibits no distension and no mass. There is no tenderness.  Musculoskeletal: Normal range of motion.  Lymphadenopathy:    She has no cervical adenopathy.  Neurological: She is alert and oriented to person, place, and time. She has normal reflexes.  Skin: Skin is warm and dry.  Psychiatric: She has a normal mood and affect. Her behavior is normal. Judgment and thought content normal.      BP 118/71 mmHg  Pulse 81  Temp(Src) 98.9 F (37.2 C) (Oral)  Ht $R'5\' 5"'Rt$  (1.651 m)  Wt 190 lb  (86.183 kg)  BMI 31.62 kg/m2  Results for orders placed or performed in visit on 06/10/15  POCT glycosylated hemoglobin (Hb A1C)  Result Value Ref Range   Hemoglobin A1C 6.5       Assessment & Plan:   1. Type 1 diabetes mellitus without complication (HCC) Continue to watch carbs on diet - POCT glycosylated hemoglobin (Hb A1C) - HUMALOG 100 UNIT/ML injection; USE UP TO 50 UNITS ONCE DAILY IN INSULIN PUMP  Dispense: 60 mL; Refill: 5  2. Hyperlipidemia with target LDL less than 100 Low fat diet - CMP14+EGFR - Lipid panel  3. Benign hypertensive heart disease without heart failure Do not add slat todiet - lisinopril-hydrochlorothiazide (PRINZIDE,ZESTORETIC) 20-12.5 MG tablet; Take 1 tablet by mouth daily.  Dispense: 90 tablet; Refill: 1  4. BMI 31.0-31.9,adult Discussed diet and exercise for person with BMI >25 Will recheck weight in 3-6 months     Labs pending Health maintenance reviewed Diet and exercise encouraged Continue all meds Follow up  In 3 month   [place], FNP

## 2015-06-11 LAB — CMP14+EGFR
A/G RATIO: 2.1 (ref 1.1–2.5)
ALT: 10 IU/L (ref 0–32)
AST: 12 IU/L (ref 0–40)
Albumin: 4.2 g/dL (ref 3.5–5.5)
Alkaline Phosphatase: 54 IU/L (ref 39–117)
BUN/Creatinine Ratio: 15 (ref 9–23)
BUN: 12 mg/dL (ref 6–24)
Bilirubin Total: 0.4 mg/dL (ref 0.0–1.2)
CO2: 24 mmol/L (ref 18–29)
Calcium: 9.3 mg/dL (ref 8.7–10.2)
Chloride: 98 mmol/L (ref 97–108)
Creatinine, Ser: 0.82 mg/dL (ref 0.57–1.00)
GFR calc non Af Amer: 84 mL/min/{1.73_m2} (ref >59)
GFR, EST AFRICAN AMERICAN: 96 mL/min/{1.73_m2} (ref >59)
GLOBULIN, TOTAL: 2 g/dL (ref 1.5–4.5)
Glucose: 51 mg/dL — ABNORMAL LOW (ref 65–99)
POTASSIUM: 4.1 mmol/L (ref 3.5–5.2)
Sodium: 138 mmol/L (ref 134–144)
TOTAL PROTEIN: 6.2 g/dL (ref 6.0–8.5)

## 2015-06-11 LAB — LIPID PANEL
CHOL/HDL RATIO: 3.1 ratio (ref 0.0–4.4)
Cholesterol, Total: 188 mg/dL (ref 100–199)
HDL: 60 mg/dL (ref >39)
LDL Calculated: 115 mg/dL — ABNORMAL HIGH (ref 0–99)
Triglycerides: 64 mg/dL (ref 0–149)
VLDL Cholesterol Cal: 13 mg/dL (ref 5–40)

## 2015-08-18 ENCOUNTER — Telehealth: Payer: Self-pay | Admitting: Nurse Practitioner

## 2015-08-19 NOTE — Telephone Encounter (Signed)
Faxed today

## 2015-09-16 ENCOUNTER — Encounter: Payer: Self-pay | Admitting: Nurse Practitioner

## 2015-09-16 ENCOUNTER — Ambulatory Visit (INDEPENDENT_AMBULATORY_CARE_PROVIDER_SITE_OTHER): Payer: 59 | Admitting: Nurse Practitioner

## 2015-09-16 VITALS — BP 119/76 | HR 73 | Temp 98.1°F | Ht 65.0 in | Wt 189.6 lb

## 2015-09-16 DIAGNOSIS — I119 Hypertensive heart disease without heart failure: Secondary | ICD-10-CM

## 2015-09-16 DIAGNOSIS — Z6831 Body mass index (BMI) 31.0-31.9, adult: Secondary | ICD-10-CM

## 2015-09-16 DIAGNOSIS — E109 Type 1 diabetes mellitus without complications: Secondary | ICD-10-CM

## 2015-09-16 DIAGNOSIS — E785 Hyperlipidemia, unspecified: Secondary | ICD-10-CM | POA: Diagnosis not present

## 2015-09-16 LAB — POCT GLYCOSYLATED HEMOGLOBIN (HGB A1C): Hemoglobin A1C: 6.9

## 2015-09-16 NOTE — Progress Notes (Signed)
Subjective:    Patient ID: Maria Conley, female    DOB: 1965/12/09, 50 y.o.   MRN: [id number]  HPI: Patient here for a follow up on chronic medical problems.   Outpatient Encounter Prescriptions as of 09/16/2015  Medication Sig  . aspirin EC 81 MG tablet Take 81 mg by mouth daily.  Marland Kitchen BAYER CONTOUR NEXT TEST test strip USE AS DIRECTED 4 TO 5 TIMES DAILY AND AS NEEDED  . HUMALOG 100 UNIT/ML injection USE UP TO 50 UNITS ONCE DAILY IN INSULIN PUMP  . Insulin Infusion Pump Supplies (MINIMED INFUSION SET-MMT 399) MISC Use with insulin pump. Change infusion set q 3 days.  Marland Kitchen lisinopril-hydrochlorothiazide (PRINZIDE,ZESTORETIC) 20-12.5 MG tablet Take 1 tablet by mouth daily.  . rosuvastatin (CRESTOR) 10 MG tablet Take 1 tablet (10 mg total) by mouth daily. (Patient not taking: Reported on 09/16/2015)   No facility-administered encounter medications on file as of 09/16/2015.    Diabetes She presents for her follow-up diabetic visit. She has type 2 diabetes mellitus. No MedicAlert identification noted. Her disease course has been stable. There are no diabetic associated symptoms. Pertinent negatives for diabetes include no chest pain, no fatigue, no polydipsia and no polyphagia. Symptoms are stable. There are no diabetic complications. Risk factors for coronary artery disease include dyslipidemia, diabetes mellitus and hypertension. Current diabetic treatment includes insulin pump. She is compliant with treatment most of the time. Her weight is stable. She has not had a previous visit with a dietitian. She participates in exercise every other day. Her breakfast blood glucose is taken between 9-10 am. Her breakfast blood glucose range is generally 130-140 mg/dl. Her dinner blood glucose is taken between 7-8 pm. Her dinner blood glucose range is generally 130-140 mg/dl. Her highest blood glucose is >200 mg/dl. Her overall blood glucose range is 130-140 mg/dl. An ACE inhibitor/angiotensin II receptor  blocker is being taken. She does not see a podiatrist.Eye exam is not current.  Hyperlipidemia This is a chronic problem. The current episode started more than 1 year ago. The problem is uncontrolled. Recent lipid tests were reviewed and are high. Exacerbating diseases include diabetes. She has no history of hypothyroidism or obesity. Pertinent negatives include no chest pain or shortness of breath. Current antihyperlipidemic treatment includes statins. The current treatment provides moderate improvement of lipids. Compliance problems include adherence to diet and adherence to exercise.  Risk factors for coronary artery disease include diabetes mellitus, dyslipidemia, hypertension and post-menopausal.  Hypertension This is a chronic problem. The current episode started more than 1 year ago. The problem is controlled. Pertinent negatives include no chest pain or shortness of breath. Risk factors for coronary artery disease include diabetes mellitus, dyslipidemia and post-menopausal state. Past treatments include diuretics and ACE inhibitors. The current treatment provides moderate improvement. Compliance problems include diet and exercise.       Review of Systems  Constitutional: Negative for fatigue.  Respiratory: Negative for shortness of breath.   Cardiovascular: Negative for chest pain.  Endocrine: Negative for polydipsia and polyphagia.  Musculoskeletal: Negative.   Skin: Negative.   Neurological: Negative.   Psychiatric/Behavioral: Negative.   All other systems reviewed and are negative.      Objective:   Physical Exam  Constitutional: She is oriented to person, place, and time. She appears well-developed and well-nourished.  HENT:  Nose: Nose normal.  Mouth/Throat: Oropharynx is clear and moist.  Eyes: EOM are normal.  Neck: Trachea normal, normal range of motion and full passive range of motion without  pain. Neck supple. No JVD present. Carotid bruit is not present. No thyromegaly  present.  Cardiovascular: Normal rate, regular rhythm, normal heart sounds and intact distal pulses.  Exam reveals no gallop and no friction rub.   No murmur heard. Pulmonary/Chest: Effort normal and breath sounds normal.  Abdominal: Soft. Bowel sounds are normal. She exhibits no distension and no mass. There is no tenderness.  Musculoskeletal: Normal range of motion.  Lymphadenopathy:    She has no cervical adenopathy.  Neurological: She is alert and oriented to person, place, and time. She has normal reflexes.  Skin: Skin is warm and dry.  Psychiatric: She has a normal mood and affect. Her behavior is normal. Judgment and thought content normal.    BP 119/76 mmHg  Pulse 73  Temp(Src) 98.1 F (36.7 C) (Oral)  Ht _0  (1.651 m)  Wt 189 lb 9.6 oz (86.002 kg)  BMI 31.55 kg/m2  Results for orders placed or performed in visit on 09/16/15  POCT glycosylated hemoglobin (Hb A1C)  Result Value Ref Range   Hemoglobin A1C 6.9       Assessment & Plan:  1. Type 1 diabetes mellitus without complication (HCC) Continue carb counting - POCT glycosylated hemoglobin (Hb A1C) - Microalbumin / creatinine urine ratio  2. Hyperlipidemia with target LDL less than 100 Low fat diet - Lipid panel  3. Benign hypertensive heart disease without heart failure Do not add salt to diet - CMP14+EGFR  4. BMI 31.0-31.9,adult Discussed diet and exercise for person with BMI >25 Will recheck weight in 3-6 months     Labs pending Health maintenance reviewed Diet and exercise encouraged Continue all meds Follow up  In 3 months   [place], FNP

## 2015-09-16 NOTE — Patient Instructions (Signed)
Health Maintenance, Female Adopting a healthy lifestyle and getting preventive care can go a long way to promote health and wellness. Talk with your health care provider about what schedule of regular examinations is right for you. This is a good chance for you to check in with your provider about disease prevention and staying healthy. In between checkups, there are plenty of things you can do on your own. Experts have done a lot of research about which lifestyle changes and preventive measures are most likely to keep you healthy. Ask your health care provider for more information. WEIGHT AND DIET  Eat a healthy diet  Be sure to include plenty of vegetables, fruits, low-fat dairy products, and lean protein.  Do not eat a lot of foods high in solid fats, added sugars, or salt.  Get regular exercise. This is one of the most important things you can do for your health.  Most adults should exercise for at least 150 minutes each week. The exercise should increase your heart rate and make you sweat (moderate-intensity exercise).  Most adults should also do strengthening exercises at least twice a week. This is in addition to the moderate-intensity exercise.  Maintain a healthy weight  Body mass index (BMI) is a measurement that can be used to identify possible weight problems. It estimates body fat based on height and weight. Your health care provider can help determine your BMI and help you achieve or maintain a healthy weight.  For females 20 years of age and older:   A BMI below 18.5 is considered underweight.  A BMI of 18.5 to 24.9 is normal.  A BMI of 25 to 29.9 is considered overweight.  A BMI of 30 and above is considered obese.  Watch levels of cholesterol and blood lipids  You should start having your blood tested for lipids and cholesterol at 52 years of age, then have this test every 5 years.  You may need to have your cholesterol levels checked more often if:  Your lipid  or cholesterol levels are high.  You are older than 52 years of age.  You are at high risk for heart disease.  CANCER SCREENING   Lung Cancer  Lung cancer screening is recommended for adults 55-80 years old who are at high risk for lung cancer because of a history of smoking.  A yearly low-dose CT scan of the lungs is recommended for people who:  Currently smoke.  Have quit within the past 15 years.  Have at least a 30-pack-year history of smoking. A pack year is smoking an average of one pack of cigarettes a day for 1 year.  Yearly screening should continue until it has been 15 years since you quit.  Yearly screening should stop if you develop a health problem that would prevent you from having lung cancer treatment.  Breast Cancer  Practice breast self-awareness. This means understanding how your breasts normally appear and feel.  It also means doing regular breast self-exams. Let your health care provider know about any changes, no matter how small.  If you are in your 20s or 30s, you should have a clinical breast exam (CBE) by a health care provider every 1-3 years as part of a regular health exam.  If you are 40 or older, have a CBE every year. Also consider having a breast X-ray (mammogram) every year.  If you have a family history of breast cancer, talk to your health care provider about genetic screening.  If you   are at high risk for breast cancer, talk to your health care provider about having an MRI and a mammogram every year.  Breast cancer gene (BRCA) assessment is recommended for women who have family members with BRCA-related cancers. BRCA-related cancers include:  Breast.  Ovarian.  Tubal.  Peritoneal cancers.  Results of the assessment will determine the need for genetic counseling and BRCA1 and BRCA2 testing. Cervical Cancer Your health care provider may recommend that you be screened regularly for cancer of the pelvic organs (ovaries, uterus, and  vagina). This screening involves a pelvic examination, including checking for microscopic changes to the surface of your cervix (Pap test). You may be encouraged to have this screening done every 3 years, beginning at age 21.  For women ages 30-65, health care providers may recommend pelvic exams and Pap testing every 3 years, or they may recommend the Pap and pelvic exam, combined with testing for human papilloma virus (HPV), every 5 years. Some types of HPV increase your risk of cervical cancer. Testing for HPV may also be done on women of any age with unclear Pap test results.  Other health care providers may not recommend any screening for nonpregnant women who are considered low risk for pelvic cancer and who do not have symptoms. Ask your health care provider if a screening pelvic exam is right for you.  If you have had past treatment for cervical cancer or a condition that could lead to cancer, you need Pap tests and screening for cancer for at least 20 years after your treatment. If Pap tests have been discontinued, your risk factors (such as having a new sexual partner) need to be reassessed to determine if screening should resume. Some women have medical problems that increase the chance of getting cervical cancer. In these cases, your health care provider may recommend more frequent screening and Pap tests. Colorectal Cancer  This type of cancer can be detected and often prevented.  Routine colorectal cancer screening usually begins at 52 years of age and continues through 52 years of age.  Your health care provider may recommend screening at an earlier age if you have risk factors for colon cancer.  Your health care provider may also recommend using home test kits to check for hidden blood in the stool.  A small camera at the end of a tube can be used to examine your colon directly (sigmoidoscopy or colonoscopy). This is done to check for the earliest forms of colorectal  cancer.  Routine screening usually begins at age 50.  Direct examination of the colon should be repeated every 5-10 years through 52 years of age. However, you may need to be screened more often if early forms of precancerous polyps or small growths are found. Skin Cancer  Check your skin from head to toe regularly.  Tell your health care provider about any new moles or changes in moles, especially if there is a change in a mole's shape or color.  Also tell your health care provider if you have a mole that is larger than the size of a pencil eraser.  Always use sunscreen. Apply sunscreen liberally and repeatedly throughout the day.  Protect yourself by wearing long sleeves, pants, a wide-brimmed hat, and sunglasses whenever you are outside. HEART DISEASE, DIABETES, AND HIGH BLOOD PRESSURE   High blood pressure causes heart disease and increases the risk of stroke. High blood pressure is more likely to develop in:  People who have blood pressure in the high end   of the normal range (130-139/85-89 mm Hg).  People who are overweight or obese.  People who are African American.  If you are 38-23 years of age, have your blood pressure checked every 3-5 years. If you are 61 years of age or older, have your blood pressure checked every year. You should have your blood pressure measured twice--once when you are at a hospital or clinic, and once when you are not at a hospital or clinic. Record the average of the two measurements. To check your blood pressure when you are not at a hospital or clinic, you can use:  An automated blood pressure machine at a pharmacy.  A home blood pressure monitor.  If you are between 45 years and 39 years old, ask your health care provider if you should take aspirin to prevent strokes.  Have regular diabetes screenings. This involves taking a blood sample to check your fasting blood sugar level.  If you are at a normal weight and have a low risk for diabetes,  have this test once every three years after 52 years of age.  If you are overweight and have a high risk for diabetes, consider being tested at a younger age or more often. PREVENTING INFECTION  Hepatitis B  If you have a higher risk for hepatitis B, you should be screened for this virus. You are considered at high risk for hepatitis B if:  You were born in a country where hepatitis B is common. Ask your health care provider which countries are considered high risk.  Your parents were born in a high-risk country, and you have not been immunized against hepatitis B (hepatitis B vaccine).  You have HIV or AIDS.  You use needles to inject street drugs.  You live with someone who has hepatitis B.  You have had sex with someone who has hepatitis B.  You get hemodialysis treatment.  You take certain medicines for conditions, including cancer, organ transplantation, and autoimmune conditions. Hepatitis C  Blood testing is recommended for:  Everyone born from [birth year] through 1967.  Anyone with known risk factors for hepatitis C. Sexually transmitted infections (STIs)  You should be screened for sexually transmitted infections (STIs) including gonorrhea and chlamydia if:  You are sexually active and are younger than 52 years of age.  You are older than 53 years of age and your health care provider tells you that you are at risk for this type of infection.  Your sexual activity has changed since you were last screened and you are at an increased risk for chlamydia or gonorrhea. Ask your health care provider if you are at risk.  If you do not have HIV, but are at risk, it may be recommended that you take a prescription medicine daily to prevent HIV infection. This is called pre-exposure prophylaxis (PrEP). You are considered at risk if:  You are sexually active and do not regularly use condoms or know the HIV status of your partner(s).  You take drugs by injection.  You are sexually  active with a partner who has HIV. Talk with your health care provider about whether you are at high risk of being infected with HIV. If you choose to begin PrEP, you should first be tested for HIV. You should then be tested every 3 months for as long as you are taking PrEP.  PREGNANCY   If you are premenopausal and you may become pregnant, ask your health care provider about preconception counseling.  If you may  become pregnant, take 400 to 800 micrograms (mcg) of folic acid every day.  If you want to prevent pregnancy, talk to your health care provider about birth control (contraception). OSTEOPOROSIS AND MENOPAUSE   Osteoporosis is a disease in which the bones lose minerals and strength with aging. This can result in serious bone fractures. Your risk for osteoporosis can be identified using a bone density scan.  If you are 61 years of age or older, or if you are at risk for osteoporosis and fractures, ask your health care provider if you should be screened.  Ask your health care provider whether you should take a calcium or vitamin D supplement to lower your risk for osteoporosis.  Menopause may have certain physical symptoms and risks.  Hormone replacement therapy may reduce some of these symptoms and risks. Talk to your health care provider about whether hormone replacement therapy is right for you.  HOME CARE INSTRUCTIONS   Schedule regular health, dental, and eye exams.  Stay current with your immunizations.   Do not use any tobacco products including cigarettes, chewing tobacco, or electronic cigarettes.  If you are pregnant, do not drink alcohol.  If you are breastfeeding, limit how much and how often you drink alcohol.  Limit alcohol intake to no more than 1 drink per day for nonpregnant women. One drink equals 12 ounces of beer, 5 ounces of wine, or 1 ounces of hard liquor.  Do not use street drugs.  Do not share needles.  Ask your health care provider for help if  you need support or information about quitting drugs.  Tell your health care provider if you often feel depressed.  Tell your health care provider if you have ever been abused or do not feel safe at home.   This information is not intended to replace advice given to you by your health care provider. Make sure you discuss any questions you have with your health care provider.   Document Released: 03/05/2011 Document Revised: 09/10/2014 Document Reviewed: 07/22/2013 Elsevier Interactive Patient Education [employer].

## 2015-09-17 LAB — CMP14+EGFR
ALK PHOS: 68 IU/L (ref 39–117)
ALT: 11 IU/L (ref 0–32)
AST: 14 IU/L (ref 0–40)
Albumin/Globulin Ratio: 1.8 (ref 1.1–2.5)
Albumin: 4.2 g/dL (ref 3.5–5.5)
BUN/Creatinine Ratio: 12 (ref 9–23)
BUN: 9 mg/dL (ref 6–24)
Bilirubin Total: 0.3 mg/dL (ref 0.0–1.2)
CALCIUM: 9.3 mg/dL (ref 8.7–10.2)
CO2: 26 mmol/L (ref 18–29)
CREATININE: 0.74 mg/dL (ref 0.57–1.00)
Chloride: 97 mmol/L (ref 96–106)
GFR calc Af Amer: 108 mL/min/{1.73_m2} (ref >59)
GFR, EST NON AFRICAN AMERICAN: 94 mL/min/{1.73_m2} (ref >59)
GLUCOSE: 84 mg/dL (ref 65–99)
Globulin, Total: 2.4 g/dL (ref 1.5–4.5)
Potassium: 4 mmol/L (ref 3.5–5.2)
Sodium: 139 mmol/L (ref 134–144)
Total Protein: 6.6 g/dL (ref 6.0–8.5)

## 2015-09-17 LAB — LIPID PANEL
Chol/HDL Ratio: 3.3 ratio units (ref 0.0–4.4)
Cholesterol, Total: 208 mg/dL — ABNORMAL HIGH (ref 100–199)
HDL: 63 mg/dL (ref >39)
LDL CALC: 130 mg/dL — AB (ref 0–99)
TRIGLYCERIDES: 75 mg/dL (ref 0–149)
VLDL Cholesterol Cal: 15 mg/dL (ref 5–40)

## 2015-09-17 LAB — MICROALBUMIN / CREATININE URINE RATIO
Creatinine, Urine: 28.1 mg/dL
MICROALB/CREAT RATIO: <10.7 mg/g creat (ref 0.0–30.0)
Microalbumin, Urine: <3 ug/mL

## 2015-10-11 MED FILL — LISINOPRIL-HCTZ 20-12.5 MG: 20-12.5 | 90 days supply | Qty: 90 | Fill #0

## 2015-10-11 MED FILL — ROSUVASTATIN CALCIUM 10 MG: 10 | 90 days supply | Qty: 90 | Fill #0

## 2015-10-27 DIAGNOSIS — H5213 Myopia, bilateral: Secondary | ICD-10-CM | POA: Diagnosis not present

## 2015-10-27 DIAGNOSIS — E109 Type 1 diabetes mellitus without complications: Secondary | ICD-10-CM | POA: Diagnosis not present

## 2015-10-27 DIAGNOSIS — H524 Presbyopia: Secondary | ICD-10-CM | POA: Diagnosis not present

## 2015-10-27 DIAGNOSIS — H52223 Regular astigmatism, bilateral: Secondary | ICD-10-CM | POA: Diagnosis not present

## 2015-10-27 LAB — HM DIABETES EYE EXAM

## 2015-12-02 MED FILL — CONTOUR NEXT STRIPS: 18 days supply | Qty: 100 | Fill #1

## 2015-12-02 MED FILL — HumaLOG 100 UNIT/ML SOLN: 100 | 90 days supply | Qty: 50 | Fill #1

## 2015-12-12 DIAGNOSIS — E108 Type 1 diabetes mellitus with unspecified complications: Secondary | ICD-10-CM | POA: Diagnosis not present

## 2015-12-12 DIAGNOSIS — E109 Type 1 diabetes mellitus without complications: Secondary | ICD-10-CM | POA: Diagnosis not present

## 2015-12-14 DIAGNOSIS — E108 Type 1 diabetes mellitus with unspecified complications: Secondary | ICD-10-CM | POA: Diagnosis not present

## 2015-12-14 DIAGNOSIS — E109 Type 1 diabetes mellitus without complications: Secondary | ICD-10-CM | POA: Diagnosis not present

## 2015-12-19 ENCOUNTER — Encounter: Payer: Self-pay | Admitting: Nurse Practitioner

## 2015-12-19 ENCOUNTER — Ambulatory Visit (INDEPENDENT_AMBULATORY_CARE_PROVIDER_SITE_OTHER): Payer: 59 | Admitting: Nurse Practitioner

## 2015-12-19 VITALS — BP 104/58 | HR 75 | Temp 98.2°F | Ht 65.0 in | Wt 184.4 lb

## 2015-12-19 DIAGNOSIS — Z6830 Body mass index (BMI) 30.0-30.9, adult: Secondary | ICD-10-CM | POA: Diagnosis not present

## 2015-12-19 DIAGNOSIS — E109 Type 1 diabetes mellitus without complications: Secondary | ICD-10-CM

## 2015-12-19 DIAGNOSIS — I119 Hypertensive heart disease without heart failure: Secondary | ICD-10-CM

## 2015-12-19 DIAGNOSIS — E785 Hyperlipidemia, unspecified: Secondary | ICD-10-CM

## 2015-12-19 LAB — BAYER DCA HB A1C WAIVED: HB A1C: 7.3 % — AB (ref <7.0)

## 2015-12-19 MED ORDER — ROSUVASTATIN CALCIUM 10 MG PO TABS: 10.0000 mg | ORAL_TABLET | Freq: Every day | ORAL | Status: DC

## 2015-12-19 MED ORDER — LISINOPRIL-HYDROCHLOROTHIAZIDE 20-12.5 MG PO TABS: 1.0000 | ORAL_TABLET | Freq: Every day | ORAL | Status: DC

## 2015-12-19 NOTE — Patient Instructions (Signed)

## 2015-12-19 NOTE — Progress Notes (Signed)
Subjective:    Patient ID: Maria Conley, female    DOB: 1966-05-18, 50 y.o.   MRN: [id number]  HPI: Patient here for a follow up on chronic medical problems.   Outpatient Encounter Prescriptions as of 12/19/2015  Medication Sig  . aspirin EC 81 MG tablet Take 81 mg by mouth daily.  Marland Kitchen BAYER CONTOUR NEXT TEST test strip USE AS DIRECTED 4 TO 5 TIMES DAILY AND AS NEEDED  . HUMALOG 100 UNIT/ML injection USE UP TO 50 UNITS ONCE DAILY IN INSULIN PUMP  . Insulin Infusion Pump Supplies (MINIMED INFUSION SET-MMT 399) MISC Use with insulin pump. Change infusion set q 3 days.  Marland Kitchen lisinopril-hydrochlorothiazide (PRINZIDE,ZESTORETIC) 20-12.5 MG tablet Take 1 tablet by mouth daily.  . rosuvastatin (CRESTOR) 10 MG tablet Take 1 tablet (10 mg total) by mouth daily.   No facility-administered encounter medications on file as of 12/19/2015.    Diabetes She presents for her follow-up diabetic visit. She has type 2 diabetes mellitus. No MedicAlert identification noted. Her disease course has been stable. There are no diabetic associated symptoms. Pertinent negatives for diabetes include no chest pain, no fatigue, no polydipsia and no polyphagia. Symptoms are stable. There are no diabetic complications. Risk factors for coronary artery disease include dyslipidemia, diabetes mellitus and hypertension. Current diabetic treatment includes insulin pump. She is compliant with treatment most of the time. Her weight is stable. She has not had a previous visit with a dietitian. She participates in exercise every other day. Her breakfast blood glucose is taken between 9-10 am. Her breakfast blood glucose range is generally 130-140 mg/dl. Her dinner blood glucose is taken between 7-8 pm. Her dinner blood glucose range is generally 130-140 mg/dl. Her highest blood glucose is >200 mg/dl. Her overall blood glucose range is 130-140 mg/dl. An ACE inhibitor/angiotensin II receptor blocker is being taken. She does not see a  podiatrist.Eye exam is not current.  Hyperlipidemia This is a chronic problem. The current episode started more than 1 year ago. The problem is uncontrolled. Recent lipid tests were reviewed and are high. Exacerbating diseases include diabetes. She has no history of hypothyroidism or obesity. Pertinent negatives include no chest pain or shortness of breath. Current antihyperlipidemic treatment includes statins. The current treatment provides moderate improvement of lipids. Compliance problems include adherence to diet and adherence to exercise.  Risk factors for coronary artery disease include diabetes mellitus, dyslipidemia, hypertension and post-menopausal.  Hypertension This is a chronic problem. The current episode started more than 1 year ago. The problem is controlled. Pertinent negatives include no chest pain or shortness of breath. Risk factors for coronary artery disease include diabetes mellitus, dyslipidemia and post-menopausal state. Past treatments include diuretics and ACE inhibitors. The current treatment provides moderate improvement. Compliance problems include diet and exercise.       Review of Systems  Constitutional: Negative.  Negative for fatigue.  HENT: Negative.   Eyes: Negative.   Respiratory: Negative.  Negative for shortness of breath.   Cardiovascular: Negative.  Negative for chest pain.  Gastrointestinal: Negative.   Endocrine: Negative.  Negative for polydipsia and polyphagia.  Genitourinary: Negative.   Musculoskeletal: Negative.   Skin: Negative.   Allergic/Immunologic: Negative.   Neurological: Negative.   Hematological: Negative.   Psychiatric/Behavioral: Negative.   All other systems reviewed and are negative.      Objective:   Physical Exam  Constitutional: She is oriented to person, place, and time. She appears well-developed and well-nourished.  HENT:  Nose: Nose normal.  Mouth/Throat: Oropharynx is clear and moist.  Eyes: EOM are normal.   Neck: Trachea normal, normal range of motion and full passive range of motion without pain. Neck supple. No JVD present. Carotid bruit is not present. No thyromegaly present.  Cardiovascular: Normal rate, regular rhythm, normal heart sounds and intact distal pulses.  Exam reveals no gallop and no friction rub.   No murmur heard. Pulmonary/Chest: Effort normal and breath sounds normal.  Abdominal: Soft. Bowel sounds are normal. She exhibits no distension and no mass. There is no tenderness.  Musculoskeletal: Normal range of motion.  Lymphadenopathy:    She has no cervical adenopathy.  Neurological: She is alert and oriented to person, place, and time. She has normal reflexes.  Skin: Skin is warm and dry.  Psychiatric: She has a normal mood and affect. Her behavior is normal. Judgment and thought content normal.    BP 104/58 mmHg  Pulse 75  Temp(Src) 98.2 F (36.8 C) (Oral)  Ht '5\' 5"'  (1.651 m)  Wt 184 lb 6.4 oz (83.643 kg)  BMI 30.69 kg/m2  Hgb A1C 7.3    Assessment & Plan:   1. Type 1 diabetes mellitus without complication (HCC) Low carb diet - Bayer DCA Hb A1c Waived - CMP14+EGFR  2. Hyperlipidemia with target LDL less than 100 Low fat diet - CMP14+EGFR - Lipid panel - rosuvastatin (CRESTOR) 10 MG tablet; Take 1 tablet (10 mg total) by mouth daily.  Dispense: 90 tablet; Refill: 1  3. Benign hypertensive heart disease without heart failure - lisinopril-hydrochlorothiazide (PRINZIDE,ZESTORETIC) 20-12.5 MG tablet; Take 1 tablet by mouth daily.  Dispense: 90 tablet; Refill: 1  4. BMI 30.0-30.9,adult Low fat diet   Continue all meds Labs pending Health Maintenance reviewed Diet and exercise encouraged RTO 3 months Rosalio Loud FNP Student  Mary-Margaret Hassell Done, [place]

## 2015-12-20 LAB — CMP14+EGFR
A/G RATIO: 1.8 (ref 1.2–2.2)
ALK PHOS: 53 IU/L (ref 39–117)
ALT: 13 IU/L (ref 0–32)
AST: 17 IU/L (ref 0–40)
Albumin: 4 g/dL (ref 3.5–5.5)
BILIRUBIN TOTAL: 0.4 mg/dL (ref 0.0–1.2)
BUN/Creatinine Ratio: 11 (ref 9–23)
BUN: 8 mg/dL (ref 6–24)
CHLORIDE: 98 mmol/L (ref 96–106)
CO2: 23 mmol/L (ref 18–29)
Calcium: 9 mg/dL (ref 8.7–10.2)
Creatinine, Ser: 0.74 mg/dL (ref 0.57–1.00)
GFR calc non Af Amer: 94 mL/min/{1.73_m2} (ref >59)
GFR, EST AFRICAN AMERICAN: 108 mL/min/{1.73_m2} (ref >59)
Globulin, Total: 2.2 g/dL (ref 1.5–4.5)
Glucose: 159 mg/dL — ABNORMAL HIGH (ref 65–99)
POTASSIUM: 4.1 mmol/L (ref 3.5–5.2)
Sodium: 139 mmol/L (ref 134–144)
TOTAL PROTEIN: 6.2 g/dL (ref 6.0–8.5)

## 2015-12-20 LAB — LIPID PANEL
Chol/HDL Ratio: 2.3 ratio units (ref 0.0–4.4)
Cholesterol, Total: 138 mg/dL (ref 100–199)
HDL: 59 mg/dL (ref >39)
LDL Calculated: 68 mg/dL (ref 0–99)
Triglycerides: 53 mg/dL (ref 0–149)
VLDL CHOLESTEROL CAL: 11 mg/dL (ref 5–40)

## 2016-01-30 MED FILL — LISINOPRIL-HCTZ 20-12.5 MG: 20-12.5 | 90 days supply | Qty: 90 | Fill #1

## 2016-01-31 MED FILL — ROSUVASTATIN CALCIUM 10 MG: 10 | 90 days supply | Qty: 90 | Fill #1

## 2016-02-29 MED FILL — HumaLOG 100 UNIT/ML SOLN: 100 | 90 days supply | Qty: 50 | Fill #0

## 2016-03-02 DIAGNOSIS — E109 Type 1 diabetes mellitus without complications: Secondary | ICD-10-CM | POA: Diagnosis not present

## 2016-03-02 DIAGNOSIS — E108 Type 1 diabetes mellitus with unspecified complications: Secondary | ICD-10-CM | POA: Diagnosis not present

## 2016-03-12 DIAGNOSIS — E108 Type 1 diabetes mellitus with unspecified complications: Secondary | ICD-10-CM | POA: Diagnosis not present

## 2016-03-12 DIAGNOSIS — E109 Type 1 diabetes mellitus without complications: Secondary | ICD-10-CM | POA: Diagnosis not present

## 2016-03-23 ENCOUNTER — Ambulatory Visit: Payer: 59 | Admitting: Nurse Practitioner

## 2016-04-03 ENCOUNTER — Ambulatory Visit (INDEPENDENT_AMBULATORY_CARE_PROVIDER_SITE_OTHER): Payer: 59 | Admitting: Nurse Practitioner

## 2016-04-03 ENCOUNTER — Encounter: Payer: Self-pay | Admitting: Nurse Practitioner

## 2016-04-03 VITALS — BP 102/63 | HR 79 | Temp 98.4°F | Ht 65.0 in | Wt 185.0 lb

## 2016-04-03 DIAGNOSIS — E109 Type 1 diabetes mellitus without complications: Secondary | ICD-10-CM

## 2016-04-03 DIAGNOSIS — I119 Hypertensive heart disease without heart failure: Secondary | ICD-10-CM

## 2016-04-03 DIAGNOSIS — E785 Hyperlipidemia, unspecified: Secondary | ICD-10-CM

## 2016-04-03 DIAGNOSIS — Z6831 Body mass index (BMI) 31.0-31.9, adult: Secondary | ICD-10-CM

## 2016-04-03 LAB — BAYER DCA HB A1C WAIVED: HB A1C (BAYER DCA - WAIVED): 7.3 % — ABNORMAL HIGH (ref <7.0)

## 2016-04-03 MED ORDER — ROSUVASTATIN CALCIUM 10 MG PO TABS: 10.0000 mg | ORAL_TABLET | Freq: Every day | ORAL | 1 refills | Status: DC

## 2016-04-03 MED ORDER — LISINOPRIL-HYDROCHLOROTHIAZIDE 20-12.5 MG PO TABS: 1.0000 | ORAL_TABLET | Freq: Every day | ORAL | 1 refills | Status: DC

## 2016-04-03 NOTE — Progress Notes (Signed)
Subjective:    Patient ID: Maria Conley, female    DOB: 04/26/1966, 50 y.o.   MRN: [id number]  HPI: Patient here for a follow up on chronic medical problems.   Outpatient Encounter Prescriptions as of 04/03/2016  Medication Sig  . aspirin EC 81 MG tablet Take 81 mg by mouth daily.  Marland Kitchen BAYER CONTOUR NEXT TEST test strip USE AS DIRECTED 4 TO 5 TIMES DAILY AND AS NEEDED  . HUMALOG 100 UNIT/ML injection USE UP TO 50 UNITS ONCE DAILY IN INSULIN PUMP  . Insulin Infusion Pump Supplies (MINIMED INFUSION SET-MMT 399) MISC Use with insulin pump. Change infusion set q 3 days.  Marland Kitchen lisinopril-hydrochlorothiazide (PRINZIDE,ZESTORETIC) 20-12.5 MG tablet Take 1 tablet by mouth daily.  . rosuvastatin (CRESTOR) 10 MG tablet Take 1 tablet (10 mg total) by mouth daily.   No facility-administered encounter medications on file as of 04/03/2016.     Diabetes  She presents for her follow-up diabetic visit. She has type 2 diabetes mellitus. No MedicAlert identification noted. Her disease course has been stable. There are no hypoglycemic associated symptoms. There are no diabetic associated symptoms. Pertinent negatives for diabetes include no chest pain, no fatigue, no polydipsia and no polyphagia. Symptoms are stable. There are no diabetic complications. Risk factors for coronary artery disease include dyslipidemia, diabetes mellitus and hypertension. Current diabetic treatment includes insulin pump. She is compliant with treatment most of the time. Her weight is stable. She has not had a previous visit with a dietitian. She participates in exercise every other day. Home blood sugar record trend: does not check blood sugars everyday. Her breakfast blood glucose is taken between 9-10 am. Her breakfast blood glucose range is generally 140-180 mg/dl. Her dinner blood glucose is taken between 7-8 pm. Her dinner blood glucose range is generally 130-140 mg/dl. Her highest blood glucose is >200 mg/dl. Her overall blood glucose  range is 140-180 mg/dl. An ACE inhibitor/angiotensin II receptor blocker is being taken. She does not see a podiatrist.Eye exam is not current.  Hyperlipidemia  This is a chronic problem. The current episode started more than 1 year ago. Recent lipid tests were reviewed and are variable. Exacerbating diseases include diabetes. She has no history of hypothyroidism or obesity. Pertinent negatives include no chest pain or shortness of breath. Current antihyperlipidemic treatment includes statins. The current treatment provides moderate improvement of lipids. Compliance problems include adherence to diet and adherence to exercise.  Risk factors for coronary artery disease include diabetes mellitus, dyslipidemia, hypertension and post-menopausal.  Hypertension  This is a chronic problem. The current episode started more than 1 year ago. The problem is controlled. Pertinent negatives include no chest pain or shortness of breath. Risk factors for coronary artery disease include diabetes mellitus, dyslipidemia and post-menopausal state. Past treatments include diuretics and ACE inhibitors. The current treatment provides moderate improvement. Compliance problems include diet and exercise.  There is no history of kidney disease, CAD/MI or a thyroid problem.      Review of Systems  Constitutional: Negative.  Negative for fatigue.  HENT: Negative.   Eyes: Negative.   Respiratory: Negative.  Negative for shortness of breath.   Cardiovascular: Negative.  Negative for chest pain.  Gastrointestinal: Negative.   Endocrine: Negative.  Negative for polydipsia and polyphagia.  Genitourinary: Negative.   Musculoskeletal: Negative.   Skin: Negative.   Allergic/Immunologic: Negative.   Neurological: Negative.   Hematological: Negative.   Psychiatric/Behavioral: Negative.   All other systems reviewed and are negative.  Objective:   Physical Exam  Constitutional: She is oriented to person, place, and time.  She appears well-developed and well-nourished.  HENT:  Nose: Nose normal.  Mouth/Throat: Oropharynx is clear and moist.  Eyes: EOM are normal.  Neck: Trachea normal, normal range of motion and full passive range of motion without pain. Neck supple. No JVD present. Carotid bruit is not present. No thyromegaly present.  Cardiovascular: Normal rate, regular rhythm, normal heart sounds and intact distal pulses.  Exam reveals no gallop and no friction rub.   No murmur heard. Pulmonary/Chest: Effort normal and breath sounds normal.  Abdominal: Soft. Bowel sounds are normal. She exhibits no distension and no mass. There is no tenderness.  Musculoskeletal: Normal range of motion.  Lymphadenopathy:    She has no cervical adenopathy.  Neurological: She is alert and oriented to person, place, and time. She has normal reflexes.  Skin: Skin is warm and dry.  Psychiatric: She has a normal mood and affect. Her behavior is normal. Judgment and thought content normal.   BP 102/63   Pulse 79   Temp 98.4 F (36.9 C) (Oral)   Ht _0  (1.651 m)   Wt 185 lb (83.9 kg)   BMI 30.79 kg/m    hgba1c 7.3%  Assessment & Plan:  1. Type 1 diabetes mellitus without complication (HCC) Continue to watch carbs in diet - Bayer DCA Hb A1c Waived  2. Hyperlipidemia with target LDL less than 100 lwo fat diet - Lipid panel - rosuvastatin (CRESTOR) 10 MG tablet; Take 1 tablet (10 mg total) by mouth daily.  Dispense: 90 tablet; Refill: 1  3. Benign hypertensive heart disease without heart failure - CMP14+EGFR - lisinopril-hydrochlorothiazide (PRINZIDE,ZESTORETIC) 20-12.5 MG tablet; Take 1 tablet by mouth daily.  Dispense: 90 tablet; Refill: 1  4. BMI 31.0-31.9,adult Discussed diet and exercise for person with BMI >25 Will recheck weight in 3-6 months    Labs pending Health maintenance reviewed Diet and exercise encouraged Continue all meds Follow up  In 3 month   [place], FNP

## 2016-04-03 NOTE — Patient Instructions (Signed)
Tendinitis  Tendinitis is redness, soreness, and puffiness (inflammation) of the tendons. Tendons are band-like tissues that connect muscle to bone. Tendinitis often happens in the shoulders, heels, or elbows. It might happen if your job involves doing the same motions over and over. HOME CARE  Use a sling or splint as told by your doctor.  Put ice on the injured area.  Put ice in a plastic bag.  Place a towel between your skin and the bag.  Leave the ice on for 15-20 minutes, 03-04 times a day.  Avoid using your injured arm or leg until the pain goes away.  Do gentle exercises only as told by your doctor. Stop exercises if the pain gets worse, unless your doctor tells you otherwise.  Only take medicines as told by your doctor. GET HELP RIGHT AWAY IF:  Your pain and puffiness get worse.  You have new problems, such as loss of feeling (numbness) in the hands. MAKE SURE YOU:  Understand these instructions.  Will watch your condition.  Will get help right away if you are not doing well or get worse.   This information is not intended to replace advice given to you by your health care provider. Make sure you discuss any questions you have with your health care provider.   Document Released: 11/30/2010 Document Revised: 11/12/2011 Document Reviewed: 11/30/2010 Elsevier Interactive Patient Education 2016 [employer]  

## 2016-04-04 LAB — LIPID PANEL
CHOLESTEROL TOTAL: 158 mg/dL (ref 100–199)
Chol/HDL Ratio: 2.7 ratio units (ref 0.0–4.4)
HDL: 59 mg/dL (ref >39)
LDL Calculated: 80 mg/dL (ref 0–99)
Triglycerides: 95 mg/dL (ref 0–149)
VLDL CHOLESTEROL CAL: 19 mg/dL (ref 5–40)

## 2016-04-04 LAB — CMP14+EGFR
ALBUMIN: 4 g/dL (ref 3.5–5.5)
ALT: 16 IU/L (ref 0–32)
AST: 14 IU/L (ref 0–40)
Albumin/Globulin Ratio: 1.7 (ref 1.2–2.2)
Alkaline Phosphatase: 60 IU/L (ref 39–117)
BUN/Creatinine Ratio: 22 (ref 9–23)
BUN: 17 mg/dL (ref 6–24)
Bilirubin Total: 0.3 mg/dL (ref 0.0–1.2)
CALCIUM: 9 mg/dL (ref 8.7–10.2)
CHLORIDE: 99 mmol/L (ref 96–106)
CO2: 27 mmol/L (ref 18–29)
CREATININE: 0.79 mg/dL (ref 0.57–1.00)
GFR calc non Af Amer: 87 mL/min/{1.73_m2} (ref >59)
GFR, EST AFRICAN AMERICAN: 100 mL/min/{1.73_m2} (ref >59)
GLUCOSE: 152 mg/dL — AB (ref 65–99)
Globulin, Total: 2.4 g/dL (ref 1.5–4.5)
Potassium: 4.6 mmol/L (ref 3.5–5.2)
Sodium: 139 mmol/L (ref 134–144)
TOTAL PROTEIN: 6.4 g/dL (ref 6.0–8.5)

## 2016-04-10 ENCOUNTER — Encounter: Payer: Self-pay | Admitting: Pharmacist

## 2016-04-10 ENCOUNTER — Ambulatory Visit: Payer: 59 | Admitting: Pharmacist

## 2016-04-10 ENCOUNTER — Other Ambulatory Visit: Payer: Self-pay | Admitting: Pharmacist

## 2016-04-10 MED FILL — CEPHALEXIN 500 MG CAPSULE: 500 | 10 days supply | Qty: 40 | Fill #0

## 2016-04-10 NOTE — Patient Outreach (Signed)
[facility] Care Management  [facility]   04/10/2016  [name] 05/01/1966 [id number]  Subjective: Patient presents today for 3 month diabetes follow-up as part of the employer-sponsored Link to Wellness program.  Current diabetes regimen includes Minimed Insulin Infusion Pump.  Patient also continues on daily ASA, lisinopril and rosuvastatin.  Most recent primary care follow-up was 04/03/16. She was prescribed a 6 day prednisone dose pack 2 times over the past month for muscle spasms and her last dose of steroids was 1.5 weeks ago. While on steroids she did increase her basal rate. Patient reports changing her infusion site every 3-4 days but states she was prescribed Keflex due to an abscess at her infusion site. She reports checking her blood glucose 4 times per day most days and at least twice per day to calibrate her continuous glucose monitor.    Patient reports hypoglycemic events 3 times over the past week in which she states proper treatment with glucose tablets.  She reports overcorrecting high blood glucose by adding more to her bolus wizard calculation. She states she speeds through carb counting sometimes but uses her bolus wizard most of the time for bolus doses.    Patient reported dietary habits: Previously doing the Whole 30 diet which resulted in a ~10 lb weight loss over the past year. Currently she is still utilizing some of the Whole 30 principles including low usage of grains, dairy products, and avoiding white potatoes.   Patient reported exercise habits: Walks 1.5 miles 5 days per week   Patient reports nocturia 1 time per night Patient denies pain/burning upon urination.  Patient denies neuropathy. Patient denies visual changes. Patient reports self foot exams.    Objective:  04/03/16 A1C: 7.3  Lab Results  Component Value Date   HGBA1C 6.9 09/16/2015   Vitals:   04/10/16 0837  BP: 138/77  Pulse: 83    Lipid Panel      Component Value Date/Time   CHOL 158 04/03/2016 1431   TRIG 95 04/03/2016 1431   TRIG 84 11/11/2014 0856   HDL 59 04/03/2016 1431   HDL 52 11/11/2014 0856   CHOLHDL 2.7 04/03/2016 1431   LDLCALC 80 04/03/2016 1431   LDLCALC 102 (H) 05/12/2014 1317    Home fasting CBG: 14 day average 126 mg/dL 2 hour post-prandial/random CBG: 14 day average 239 mg/dL CBG 14 day average: 829179 mg/dL   Encounter Medications: Outpatient Encounter Prescriptions as of 04/10/2016  Medication Sig  . aspirin EC 81 MG tablet Take 81 mg by mouth daily.  Marland Kitchen. BAYER CONTOUR NEXT TEST test strip USE AS DIRECTED 4 TO 5 TIMES DAILY AND AS NEEDED  . HUMALOG 100 UNIT/ML injection USE UP TO 50 UNITS ONCE DAILY IN INSULIN PUMP  . Insulin Infusion Pump Supplies (MINIMED INFUSION SET-MMT 399) MISC Use with insulin pump. Change infusion set q 3 days.  Marland Kitchen. lisinopril-hydrochlorothiazide (PRINZIDE,ZESTORETIC) 20-12.5 MG tablet Take 1 tablet by mouth daily.  . Magnesium 250 MG TABS Take 250 mg by mouth daily.   . rosuvastatin (CRESTOR) 10 MG tablet Take 1 tablet (10 mg total) by mouth daily.   No facility-administered encounter medications on file as of 04/10/2016.     Functional Status: In your present state of health, do you have any difficulty performing the following activities: 04/10/2016 05/12/2015  Hearing? N N  Vision? N N  Difficulty concentrating or making decisions? N N  Walking or climbing stairs? N N  Dressing or bathing? N  N  Doing errands, shopping? N N  Some recent data might be hidden    Fall/Depression Screening: PHQ 2/9 Scores 04/10/2016 04/03/2016 12/19/2015 06/10/2015 05/12/2015 04/14/2015 03/02/2015  PHQ - 2 Score 0 0 0 0 0 0 0    Assessment:  Diabetes: Most recent A1C was 7.3% which is at above goal of less than 7%. Weight is decreased from last visit with the Link to Wellness program.  Patient has been on 2 courses of 6 day steroids over the past month for muscle spasms and plans to start Keflex today for an  infection at her infusion site in which she reports adherence to changing infusion sets every 3-4 days. She is having occasional hypoglycemia (3x over past week) likely due to overcorrecting highs and self adjusting recommendations from pump bolus wizard.   Lifestyle improvements:  Physical Activity- At goal of 150 minutes/week of mild/moderate exercise  Nutrition- reports adhering to low carbohydrate diet utilizing Whole 30 diet    Plan/Goals for Next Visit: -Reviewed signs/symptoms and treatment of hypoglycemia utilizing rule of 15s -Counseled on importance of utilizing bolus wizard to avoid overcorrecting highs.  -Counseled on low carbohydrate diet, portion sizes, and how to count carbohydrates -Patient plans to followup with Pincus Large for further education and review on carbohydrate counting with her pump.  -Encouraged patient to continue to walk 5 days per week and increase her walking gradually.   Next appointment to see me is in 3 months via telephone   Hazle Nordmann, PharmD [facility] PGY2 Pharmacy Resident [phone]  [facility] CM Care Plan Problem One   Flowsheet Row Most Recent Value  Care Plan Problem One  Knowledge deficit with carbohydrate counting and always utilizing bolus wizard as evidenced by frequent hypoglycemia  Role Documenting the Problem One  Clinical Pharmacist  Care Plan for Problem One  Active  [facility] Long Term Goal (31-90 days)  Patient will utilize bolus wizard to correct for meal time and high blood sugars as evidenced by patient report and pump report over the next 90 days  [facility] Long Term Goal Start Date  04/10/16  Interventions for Problem One Long Term Goal  Reviewed signs/symptoms and treatment of hypoglycemia utilizing rule of 15s. Counseled on importance of utilizing bolus wizard to avoid overcorrecting highs. Counseled on low carbohydrate diet, portion sizes, and how to count carbohydrates. Patient plans to followup with Pincus Large for further education  and review on carbohydrate counting with her pump.   [facility] CM Short Term Goal #1 (0-30 days)  Patient will schedule dietician education to review carbohydrate counting with her insulin pump in the next 30 days  [facility] CM Short Term Goal #1 Start Date  04/10/16  Interventions for Short Term Goal #1  Reviewed signs/symptoms and treatment of hypoglycemia utilizing rule of 15s. Counseled on importance of utilizing bolus wizard to avoid overcorrecting highs. Counseled on low carbohydrate diet, portion sizes, and how to count carbohydrates. Patient plans to followup with Pincus Large for further education and review on carbohydrate counting with her pump.     [facility] CM Care Plan Problem Two   Flowsheet Row Most Recent Value  Care Plan Problem Two  Weight management as evidenced by patient weight above goal weigh  Role Documenting the Problem Two  Clinical Pharmacist  Care Plan for Problem Two  Active  Interventions for Problem Two Long Term Goal   Counseled on low carb diet.  Patient to make followup appointment with dietician.  Encouraged patient to continue to walk  5 days per week.  [facility] Long Term Goal (31-90) days  Patient will continue low carbohydrate diet utilizing whole 30 principles and walking 5 days per week to achieve a 10 lb weight loss over the next 90 days  [facility] Long Term Goal Start Date  04/10/16

## 2016-04-16 ENCOUNTER — Other Ambulatory Visit: Payer: Self-pay | Admitting: Family Medicine

## 2016-04-17 MED FILL — CONTOUR NEXT STRIPS: 80 days supply | Qty: 400 | Fill #0

## 2016-05-10 MED FILL — LISINOPRIL-HCTZ 20-12.5 MG: 20-12.5 | 90 days supply | Qty: 90 | Fill #0

## 2016-05-10 MED FILL — ROSUVASTATIN CALCIUM 10 MG: 10 | 90 days supply | Qty: 90 | Fill #0

## 2016-05-25 MED FILL — HumaLOG 100 UNIT/ML SOLN: 100 | 90 days supply | Qty: 50 | Fill #1

## 2016-05-29 ENCOUNTER — Encounter: Payer: Self-pay | Admitting: Nurse Practitioner

## 2016-05-29 ENCOUNTER — Ambulatory Visit (INDEPENDENT_AMBULATORY_CARE_PROVIDER_SITE_OTHER): Payer: 59 | Admitting: Nurse Practitioner

## 2016-05-29 VITALS — BP 117/84 | HR 77 | Temp 98.6°F | Ht 65.0 in | Wt 188.0 lb

## 2016-05-29 DIAGNOSIS — Z0289 Encounter for other administrative examinations: Secondary | ICD-10-CM

## 2016-05-29 DIAGNOSIS — Z024 Encounter for examination for driving license: Secondary | ICD-10-CM

## 2016-05-29 NOTE — Progress Notes (Signed)
Patient comes in today to have DMV papers filled out.

## 2016-06-01 DIAGNOSIS — E108 Type 1 diabetes mellitus with unspecified complications: Secondary | ICD-10-CM | POA: Diagnosis not present

## 2016-06-01 DIAGNOSIS — E109 Type 1 diabetes mellitus without complications: Secondary | ICD-10-CM | POA: Diagnosis not present

## 2016-06-06 DIAGNOSIS — Z01419 Encounter for gynecological examination (general) (routine) without abnormal findings: Secondary | ICD-10-CM | POA: Diagnosis not present

## 2016-06-06 DIAGNOSIS — R319 Hematuria, unspecified: Secondary | ICD-10-CM | POA: Diagnosis not present

## 2016-06-06 DIAGNOSIS — B001 Herpesviral vesicular dermatitis: Secondary | ICD-10-CM | POA: Diagnosis not present

## 2016-06-06 DIAGNOSIS — Z130 Encounter for screening for diseases of the blood and blood-forming organs and certain disorders involving the immune mechanism: Secondary | ICD-10-CM | POA: Diagnosis not present

## 2016-06-06 DIAGNOSIS — Z1389 Encounter for screening for other disorder: Secondary | ICD-10-CM | POA: Diagnosis not present

## 2016-06-06 DIAGNOSIS — Z1231 Encounter for screening mammogram for malignant neoplasm of breast: Secondary | ICD-10-CM | POA: Diagnosis not present

## 2016-06-06 DIAGNOSIS — Z6830 Body mass index (BMI) 30.0-30.9, adult: Secondary | ICD-10-CM | POA: Diagnosis not present

## 2016-06-10 DIAGNOSIS — E109 Type 1 diabetes mellitus without complications: Secondary | ICD-10-CM | POA: Diagnosis not present

## 2016-06-10 DIAGNOSIS — E108 Type 1 diabetes mellitus with unspecified complications: Secondary | ICD-10-CM | POA: Diagnosis not present

## 2016-07-05 ENCOUNTER — Ambulatory Visit: Payer: 59 | Admitting: Nurse Practitioner

## 2016-07-12 ENCOUNTER — Encounter: Payer: Self-pay | Admitting: Nurse Practitioner

## 2016-07-12 ENCOUNTER — Ambulatory Visit (INDEPENDENT_AMBULATORY_CARE_PROVIDER_SITE_OTHER): Payer: 59 | Admitting: Nurse Practitioner

## 2016-07-12 VITALS — BP 111/69 | HR 46 | Temp 98.1°F | Ht 65.0 in | Wt 188.0 lb

## 2016-07-12 DIAGNOSIS — E785 Hyperlipidemia, unspecified: Secondary | ICD-10-CM | POA: Diagnosis not present

## 2016-07-12 DIAGNOSIS — I119 Hypertensive heart disease without heart failure: Secondary | ICD-10-CM

## 2016-07-12 DIAGNOSIS — E109 Type 1 diabetes mellitus without complications: Secondary | ICD-10-CM | POA: Diagnosis not present

## 2016-07-12 DIAGNOSIS — Z6831 Body mass index (BMI) 31.0-31.9, adult: Secondary | ICD-10-CM | POA: Diagnosis not present

## 2016-07-12 LAB — BAYER DCA HB A1C WAIVED: HB A1C (BAYER DCA - WAIVED): 6.7 % (ref <7.0)

## 2016-07-12 MED ORDER — ROSUVASTATIN CALCIUM 10 MG PO TABS: 10.0000 mg | ORAL_TABLET | Freq: Every day | ORAL | 1 refills | Status: DC

## 2016-07-12 MED ORDER — GLUCOSE BLOOD VI STRP: ORAL_STRIP | 2 refills | Status: DC

## 2016-07-12 MED ORDER — HUMALOG 100 UNIT/ML ~~LOC~~ SOLN: SUBCUTANEOUS | 5 refills | Status: DC

## 2016-07-12 MED ORDER — LISINOPRIL-HYDROCHLOROTHIAZIDE 20-12.5 MG PO TABS
1.0000 | ORAL_TABLET | Freq: Every day | ORAL | 1 refills | Status: DC
Start: 2016-07-12 — End: 2016-11-13

## 2016-07-12 NOTE — Progress Notes (Signed)
Subjective:    Patient ID: Maria Conley, female    DOB: Jun 01, 1966, 50 y.o.   MRN: [id number]  HPI: Patient here for a follow up on chronic medical problems. NO changes since last visit. NO complaints today.  Outpatient Encounter Prescriptions as of 07/12/2016  Medication Sig  . aspirin EC 81 MG tablet Take 81 mg by mouth daily.  Marland Kitchen BAYER CONTOUR NEXT TEST test strip USE AS DIRECTED 4 TO 5 TIMES DAILY AND AS NEEDED  . HUMALOG 100 UNIT/ML injection USE UP TO 50 UNITS ONCE DAILY IN INSULIN PUMP  . Insulin Infusion Pump Supplies (MINIMED INFUSION SET-MMT 399) MISC Use with insulin pump. Change infusion set q 3 days.  Marland Kitchen lisinopril-hydrochlorothiazide (PRINZIDE,ZESTORETIC) 20-12.5 MG tablet Take 1 tablet by mouth daily.  . rosuvastatin (CRESTOR) 10 MG tablet Take 1 tablet (10 mg total) by mouth daily.  . [DISCONTINUED] Magnesium 250 MG TABS Take 250 mg by mouth daily.    No facility-administered encounter medications on file as of 07/12/2016.     Diabetes  She presents for her follow-up diabetic visit. She has type 2 diabetes mellitus. No MedicAlert identification noted. Her disease course has been stable. There are no hypoglycemic associated symptoms. There are no diabetic associated symptoms. Pertinent negatives for diabetes include no chest pain, no fatigue, no polydipsia and no polyphagia. Symptoms are stable. There are no diabetic complications. Risk factors for coronary artery disease include dyslipidemia, diabetes mellitus and hypertension. Current diabetic treatment includes insulin pump. She is compliant with treatment most of the time. Her weight is stable. She has not had a previous visit with a dietitian. She participates in exercise every other day. Home blood sugar record trend: does not check blood sugars everyday. Her breakfast blood glucose is taken between 9-10 am. Her breakfast blood glucose range is generally 110-130 mg/dl. Her dinner blood glucose is taken between 7-8 pm. Her  dinner blood glucose range is generally 110-130 mg/dl. Her highest blood glucose is 180-200 mg/dl. Her overall blood glucose range is 140-180 mg/dl. An ACE inhibitor/angiotensin II receptor blocker is being taken. She does not see a podiatrist.Eye exam is not current.  Hyperlipidemia  This is a chronic problem. The current episode started more than 1 year ago. Recent lipid tests were reviewed and are variable. Exacerbating diseases include diabetes. She has no history of hypothyroidism or obesity. Pertinent negatives include no chest pain or shortness of breath. Current antihyperlipidemic treatment includes statins. The current treatment provides moderate improvement of lipids. Compliance problems include adherence to diet and adherence to exercise.  Risk factors for coronary artery disease include diabetes mellitus, dyslipidemia, hypertension and post-menopausal.  Hypertension  This is a chronic problem. The current episode started more than 1 year ago. The problem is controlled. Pertinent negatives include no chest pain or shortness of breath. Risk factors for coronary artery disease include diabetes mellitus, dyslipidemia and post-menopausal state. Past treatments include diuretics and ACE inhibitors. The current treatment provides moderate improvement. Compliance problems include diet and exercise.  There is no history of kidney disease, CAD/MI or a thyroid problem.      Review of Systems  Constitutional: Negative.  Negative for fatigue.  HENT: Negative.   Eyes: Negative.   Respiratory: Negative.  Negative for shortness of breath.   Cardiovascular: Negative.  Negative for chest pain.  Gastrointestinal: Negative.   Endocrine: Negative.  Negative for polydipsia and polyphagia.  Genitourinary: Negative.   Musculoskeletal: Negative.   Skin: Negative.   Allergic/Immunologic: Negative.   Neurological:  Negative.   Hematological: Negative.   Psychiatric/Behavioral: Negative.   All other systems  reviewed and are negative.      Objective:   Physical Exam  Constitutional: She is oriented to person, place, and time. She appears well-developed and well-nourished.  HENT:  Nose: Nose normal.  Mouth/Throat: Oropharynx is clear and moist.  Eyes: EOM are normal.  Neck: Trachea normal, normal range of motion and full passive range of motion without pain. Neck supple. No JVD present. Carotid bruit is not present. No thyromegaly present.  Cardiovascular: Normal rate, regular rhythm, normal heart sounds and intact distal pulses.  Exam reveals no gallop and no friction rub.   No murmur heard. Pulmonary/Chest: Effort normal and breath sounds normal.  Abdominal: Soft. Bowel sounds are normal. She exhibits no distension and no mass. There is no tenderness.  Musculoskeletal: Normal range of motion.  Lymphadenopathy:    She has no cervical adenopathy.  Neurological: She is alert and oriented to person, place, and time. She has normal reflexes.  Skin: Skin is warm and dry.  Psychiatric: She has a normal mood and affect. Her behavior is normal. Judgment and thought content normal.   BP 111/69   Pulse (!) 46   Temp 98.1 F (36.7 C) (Oral)   Ht '5\' 5"'  (1.651 m)   Wt 188 lb (85.3 kg)   BMI 31.28 kg/m    hgba1c 6.7 down from 7.3% at last visit  Assessment & Plan:  1. Type 1 diabetes mellitus without complication (HCC) Continue current diet - Bayer DCA Hb A1c Waived - HUMALOG 100 UNIT/ML injection; USE UP TO 50 UNITS ONCE DAILY IN INSULIN PUMP  Dispense: 60 mL; Refill: 5 - glucose blood (BAYER CONTOUR NEXT TEST) test strip; USE AS DIRECTED 4 TO 5 TIMES DAILY AND AS NEEDED  Dispense: 300 each; Refill: 2  2. Hyperlipidemia with target LDL less than 100 Low fta diet - Lipid panel - rosuvastatin (CRESTOR) 10 MG tablet; Take 1 tablet (10 mg total) by mouth daily.  Dispense: 90 tablet; Refill: 1  3. Benign hypertensive heart disease without heart failure Low sodium diet - CMP14+EGFR -  lisinopril-hydrochlorothiazide (PRINZIDE,ZESTORETIC) 20-12.5 MG tablet; Take 1 tablet by mouth daily.  Dispense: 90 tablet; Refill: 1  4. BMI 31.0-31.9,adult Discussed diet and exercise for person with BMI >25 Will recheck weight in 3-6 months     Labs pending Health maintenance reviewed Diet and exercise encouraged Continue all meds Follow up  In 3 month   [place], FNP

## 2016-07-13 ENCOUNTER — Ambulatory Visit: Payer: Self-pay | Admitting: Pharmacist

## 2016-07-13 LAB — CMP14+EGFR
A/G RATIO: 1.8 (ref 1.2–2.2)
ALT: 15 IU/L (ref 0–32)
AST: 11 IU/L (ref 0–40)
Albumin: 4 g/dL (ref 3.5–5.5)
Alkaline Phosphatase: 59 IU/L (ref 39–117)
BUN/Creatinine Ratio: 13 (ref 9–23)
BUN: 9 mg/dL (ref 6–24)
Bilirubin Total: 0.4 mg/dL (ref 0.0–1.2)
CO2: 27 mmol/L (ref 18–29)
CREATININE: 0.72 mg/dL (ref 0.57–1.00)
Calcium: 9.5 mg/dL (ref 8.7–10.2)
Chloride: 98 mmol/L (ref 96–106)
GFR calc non Af Amer: 97 mL/min/{1.73_m2} (ref >59)
GFR, EST AFRICAN AMERICAN: 111 mL/min/{1.73_m2} (ref >59)
Globulin, Total: 2.2 g/dL (ref 1.5–4.5)
Glucose: 89 mg/dL (ref 65–99)
POTASSIUM: 4.3 mmol/L (ref 3.5–5.2)
Sodium: 140 mmol/L (ref 134–144)
Total Protein: 6.2 g/dL (ref 6.0–8.5)

## 2016-07-13 LAB — LIPID PANEL
CHOL/HDL RATIO: 2.7 ratio (ref 0.0–4.4)
Cholesterol, Total: 155 mg/dL (ref 100–199)
HDL: 58 mg/dL (ref >39)
LDL CALC: 85 mg/dL (ref 0–99)
Triglycerides: 59 mg/dL (ref 0–149)
VLDL Cholesterol Cal: 12 mg/dL (ref 5–40)

## 2016-07-20 ENCOUNTER — Other Ambulatory Visit: Payer: Self-pay | Admitting: Pharmacist

## 2016-07-20 NOTE — Patient Outreach (Signed)
[facility]) Care Management  07/20/2016  Maria Conley 07/09/1966 [id number]   Called patient today for 3 month diabetes follow up as part of the employer-sponsored Link to Wellness program.  Was unable to reach patient via telephone and have left HIPAA compliant voicemail asking her to return my call.    Hazle NordmannKelsy Haliyah Fryman, PharmD [facility] PGY2 Pharmacy Resident [phone]

## 2016-07-24 ENCOUNTER — Other Ambulatory Visit: Payer: Self-pay | Admitting: Pharmacist

## 2016-07-24 NOTE — Patient Outreach (Signed)
[facility]) Care Management  [place]   07/24/2016  Maria Conley October 05, 1965 [id number]  Subjective: Called patient today for 3 month diabetes follow-up as part of the employer-sponsored Link to Wellness program.  Current diabetes regimen includes Insulin via MiniMed Infusion pump.  Patient also continues on daily aspirin, lisinopril and rosuvastatin.  Most recent MD follow-up was 07/12/2016 .  Patient has a pending appt for 08/01/16 with Jeanie Sewer, Registered Dietitian and [place], [place] on 10/22/16.  She reports no med changes or major health changes at this time.   Patient reported dietary habits: Eats 2-3 meals/day.  States she is trying to cut out carbohydrates. She is sort of following the whole 30 principles. Reports she feels like she is somewhat accurate with her carbohydrate counting.   Patient reported exercise habits: Walking mile per day at least 5 days per week   Patient reports hypoglycemic events 3 times over past 2 weeks and mostly while sleeping. Reports proper treatment.   Patient reports nocturia 1 time per night.  Patient denies pain/burning upon urination.  Patient denies neuropathy. Patient denies visual changes. Last eye Exam 02/2016.  Patient reports self foot exams. Denies changes.  Reports changing sites every 2-3 days.   She is coming off of 12 hour night shifts in January which she believes will help prevent her low blood glucose. She states she has bene trying harder to control her blood glucose.  She reports not using the bolus wizard every time and is self correcting probably too much sometimes because she doesn't like to be high.   Patient reported self monitored blood glucose frequency 5-6 times per day.  14 day CBG avarage 141 mg/dL   Objective:  Hemoglobin A1C 07/12/2016: 6.2%  Lab Results  Component Value Date   HGBA1C 6.9 09/16/2015   Lipid Panel     Component Value Date/Time   CHOL 155 07/12/2016  1154   TRIG 59 07/12/2016 1154   TRIG 84 11/11/2014 0856   HDL 58 07/12/2016 1154   HDL 52 11/11/2014 0856   CHOLHDL 2.7 07/12/2016 1154   LDLCALC 85 07/12/2016 1154   LDLCALC 102 (H) 05/12/2014 1317     Encounter Medications: Outpatient Encounter Prescriptions as of 07/24/2016  Medication Sig  . aspirin EC 81 MG tablet Take 81 mg by mouth daily.  Marland Kitchen glucose blood (BAYER CONTOUR NEXT TEST) test strip USE AS DIRECTED 4 TO 5 TIMES DAILY AND AS NEEDED  . HUMALOG 100 UNIT/ML injection USE UP TO 50 UNITS ONCE DAILY IN INSULIN PUMP  . Insulin Infusion Pump Supplies (MINIMED INFUSION SET-MMT 399) MISC Use with insulin pump. Change infusion set q 3 days.  Marland Kitchen lisinopril-hydrochlorothiazide (PRINZIDE,ZESTORETIC) 20-12.5 MG tablet Take 1 tablet by mouth daily.  . rosuvastatin (CRESTOR) 10 MG tablet Take 1 tablet (10 mg total) by mouth daily.   No facility-administered encounter medications on file as of 07/24/2016.     Functional Status: In your present state of health, do you have any difficulty performing the following activities: 04/10/2016  Hearing? N  Vision? N  Difficulty concentrating or making decisions? N  Walking or climbing stairs? N  Dressing or bathing? N  Doing errands, shopping? N  Some recent data might be hidden    Fall/Depression Screening: PHQ 2/9 Scores 07/12/2016 05/29/2016 04/10/2016 04/03/2016 12/19/2015 06/10/2015 05/12/2015  PHQ - 2 Score 0 0 0 0 0 0 0     Assessment: Diabetes: Most recent A1C was 6.2% which is at  at goal of less than 7%. Patient continues on aspirin 81 mg, ACE inhibitor, and statin.  Patient reports that she often does not use her bolus wizard but does feel like she is somewhat accurate in counting her carbohydrates.  She has pending appointment with registered dietician on 08/01/16 to extensively discuss diet.   Plan/Goals for Next Visit: Counseled on signs/sypmtoms and treatment of hypoglycemia  Discussed low carbohydrate diet and exercise goal of at  least 150 minutes/week Patient will continue to take her medications as prescribed and will work to utilize the bolus wizard more often  Patient will followup with Jeanie Sewer, RD for further diet and insulin pump education.    Next appointment to see me is: 3 months  Bennye Alm, PharmD [facility] PGY2 Pharmacy Resident [phone]  [facility] CM Care Plan Problem One   Flowsheet Row Most Recent Value  Care Plan Problem One  Knowledge deficit with carbohydrate counting and always utilizing bolus wizard as evidenced by frequent hypoglycemia  Role Documenting the Problem One  Clinical Pharmacist  Care Plan for Problem One  Active  [facility] Long Term Goal (31-90 days)  Patient will utilize bolus wizard to correct for meal time and high blood sugars as evidenced by patient report and pump report over the next 90 days  [facility] Long Term Goal Start Date  04/10/16  Interventions for Problem One Long Term Goal  Reviewed signs/symptoms and treatment of hypoglycemia utilizing rule of 15s. Counseled on importance of utilizing bolus wizard to avoid overcorrecting highs. Counseled on low carbohydrate diet, portion sizes, and how to count carbohydrates. Patient plans to followup with Jeanie Sewer for further education and review on carbohydrate counting with her pump.   [facility] CM Short Term Goal #1 (0-30 days)  Patient will schedule dietician education to review carbohydrate counting with her insulin pump in the next 30 days  [facility] CM Short Term Goal #1 Start Date  04/10/16  [facility] CM Short Term Goal #1 Met Date  07/24/16  Interventions for Short Term Goal #1  Reviewed signs/symptoms and treatment of hypoglycemia utilizing rule of 15s. Counseled on importance of utilizing bolus wizard to avoid overcorrecting highs. Counseled on low carbohydrate diet, portion sizes, and how to count carbohydrates. Patient plans to followup with Jeanie Sewer for further education and review on carbohydrate counting with her pump.     [facility] CM  Care Plan Problem Two   Flowsheet Row Most Recent Value  Care Plan Problem Two  Weight management as evidenced by patient weight above goal weigh  Role Documenting the Problem Two  Clinical Pharmacist  Care Plan for Problem Two  Active  Interventions for Problem Two Long Term Goal   Counseled on low carb diet.  Patient to make followup appointment with dietician.  Encouraged patient to continue to walk 5 days per week.  [facility] Long Term Goal (31-90) days  Patient will continue low carbohydrate diet utilizing whole 30 principles and walking 5 days per week to achieve a 10 lb weight loss over the next 90 days  [facility] Long Term Goal Start Date  04/10/16

## 2016-08-01 ENCOUNTER — Ambulatory Visit: Payer: 59 | Admitting: *Deleted

## 2016-08-09 ENCOUNTER — Telehealth: Payer: Self-pay | Admitting: *Deleted

## 2016-08-10 ENCOUNTER — Encounter: Payer: 59 | Attending: Nurse Practitioner | Admitting: *Deleted

## 2016-08-10 DIAGNOSIS — Z713 Dietary counseling and surveillance: Secondary | ICD-10-CM | POA: Diagnosis not present

## 2016-08-10 DIAGNOSIS — E109 Type 1 diabetes mellitus without complications: Secondary | ICD-10-CM

## 2016-08-10 NOTE — Progress Notes (Signed)
Diabetes Self-Management Education  Visit Type:  First/Initial  Appt. Start Time: 0830 Appt. End Time: 0900  08/10/2016  Maria Conley Dame, identified by name and date of birth, is a 50 y.o. female with a diagnosis of Diabetes: Type 1.  She works nights as Engineer, civil (consulting)nurse at [employer]. She is wearing Medtronic Revel Insulin Pump with CGM that has Low Glucose Suspend feature. Her most recent A1c is 6.7% and she states she does have symptoms of low BG along with alerts from her CGM. Although she is in good control of her diabetes, she feels burnt out with ongoing advice from family and co-workers.   ASSESSMENT  Height 5\' 5"  (1.651 m), weight 191 lb 3.2 oz (86.7 kg). Body mass index is 31.82 kg/m.       Diabetes Self-Management Education - 08/10/16 0822      Health Coping   How would you rate your overall health? Good     Psychosocial Assessment   Patient Belief/Attitude about Diabetes Motivated to manage diabetes  and in denial   Patient Concerns Glycemic Control;Support   Special Needs None     Pre-Education Assessment   Patient understands the diabetes disease and treatment process. Demonstrates understanding / competency   Patient understands incorporating nutritional management into lifestyle. Demonstrates understanding / competency   Patient undertands incorporating physical activity into lifestyle. Demonstrates understanding / competency   Patient understands using medications safely. Demonstrates understanding / competency   Patient understands monitoring blood glucose, interpreting and using results Demonstrates understanding / competency   Patient understands prevention, detection, and treatment of acute complications. Demonstrates understanding / competency   Patient understands prevention, detection, and treatment of chronic complications. Demonstrates understanding / competency   Patient understands how to develop strategies to address psychosocial issues. Needs Review   Patient understands how to develop strategies to promote health/change behavior. Needs Review     Complications   Last HgB A1C per patient/outside source 6.7 %   How often do you check your blood sugar? 3-4 times/day   Have you had a dilated eye exam in the past 12 months? Yes   Have you had a dental exam in the past 12 months? Yes   Are you checking your feet? Yes   How many days per week are you checking your feet? 3     Exercise   Exercise Type Light (walking / raking leaves)   How many days per week to you exercise? 5.5   How many minutes per day do you exercise? 40   Total minutes per week of exercise 220     Patient Education   Previous Diabetes Education Yes (please comment)  [facility]   Psychosocial adjustment Worked with patient to identify barriers to care and solutions;Brainstormed with patient on coping mechanisms for social situations, getting support from significant others, dealing with feelings about diabetes  Offered DM 1 / Pump Support Group meetings     Individualized Goals (developed by patient)   Nutrition Other (comment)  Continue with Carb Counting and use of Bolus Wizard as appropriate   Physical Activity Exercise 3-5 times per week   Monitoring  test blood glucose pre and post meals as discussed   Reducing Risk examine blood glucose patterns  you offered to upload your pump so I can assess your setting accuracy     Post-Education Assessment   Patient understands the diabetes disease and treatment process. Demonstrates understanding / competency   Patient understands incorporating nutritional management  into lifestyle. Demonstrates understanding / competency   Patient undertands incorporating physical activity into lifestyle. Demonstrates understanding / competency   Patient understands using medications safely. Demonstrates understanding / competency   Patient understands monitoring blood glucose, interpreting and using results Demonstrates  understanding / competency   Patient understands prevention, detection, and treatment of acute complications. Demonstrates understanding / competency   Patient understands prevention, detection, and treatment of chronic complications. Demonstrates understanding / competency   Patient understands how to develop strategies to address psychosocial issues. Demonstrates understanding / competency   Patient understands how to develop strategies to promote health/change behavior. Demonstrates understanding / competency     Outcomes   Program Status Completed      Learning Objective:  Patient will have a greater understanding of diabetes self-management. Patient education plan is to attend individual and/or group sessions per assessed needs and concerns.   Plan:   Patient Instructions  Plan: Consider speaking with your family and co-workers regarding their comments and your diabetes Consider uploading your pump and notifying me so we can look at the reports and adjust your pump settings as needed Consider attending the Support Group when able.    Expected Outcomes:  Demonstrated interest in learning. Expect positive outcomes  Education material provided: None today  If problems or questions, patient to contact team via:  Phone and Email  Future DSME appointment: - PRN

## 2016-08-10 NOTE — Patient Instructions (Signed)
Plan: Consider speaking with your family and co-workers regarding their comments and your diabetes Consider uploading your pump and notifying me so we can look at the reports and adjust your pump settings as needed Consider attending the Support Group when able.

## 2016-08-28 MED FILL — ROSUVASTATIN CALCIUM 10 MG: 10 | 90 days supply | Qty: 90 | Fill #0

## 2016-08-28 MED FILL — CONTOUR NEXT STRIPS: 60 days supply | Qty: 300 | Fill #0

## 2016-08-28 MED FILL — LISINOPRIL-HCTZ 20-12.5 MG: 20-12.5 | 90 days supply | Qty: 90 | Fill #0

## 2016-08-28 MED FILL — HumaLOG 100 UNIT/ML SOLN: 100 | 80 days supply | Qty: 40 | Fill #0

## 2016-08-29 MED FILL — valACYclovir HCL 1 GM TABS: 1 | 30 days supply | Qty: 30 | Fill #0

## 2016-09-10 DIAGNOSIS — E108 Type 1 diabetes mellitus with unspecified complications: Secondary | ICD-10-CM | POA: Diagnosis not present

## 2016-09-10 DIAGNOSIS — E109 Type 1 diabetes mellitus without complications: Secondary | ICD-10-CM | POA: Diagnosis not present

## 2016-09-18 DIAGNOSIS — E108 Type 1 diabetes mellitus with unspecified complications: Secondary | ICD-10-CM | POA: Diagnosis not present

## 2016-09-18 DIAGNOSIS — E109 Type 1 diabetes mellitus without complications: Secondary | ICD-10-CM | POA: Diagnosis not present

## 2016-10-22 ENCOUNTER — Ambulatory Visit: Payer: 59 | Admitting: Nurse Practitioner

## 2016-10-26 ENCOUNTER — Ambulatory Visit: Payer: Self-pay | Admitting: Pharmacist

## 2016-11-13 ENCOUNTER — Encounter: Payer: Self-pay | Admitting: Nurse Practitioner

## 2016-11-13 ENCOUNTER — Ambulatory Visit (INDEPENDENT_AMBULATORY_CARE_PROVIDER_SITE_OTHER): Payer: 59 | Admitting: Nurse Practitioner

## 2016-11-13 VITALS — BP 119/72 | HR 73 | Temp 98.0°F | Ht 65.0 in | Wt 189.0 lb

## 2016-11-13 DIAGNOSIS — Z6831 Body mass index (BMI) 31.0-31.9, adult: Secondary | ICD-10-CM

## 2016-11-13 DIAGNOSIS — I119 Hypertensive heart disease without heart failure: Secondary | ICD-10-CM

## 2016-11-13 DIAGNOSIS — E109 Type 1 diabetes mellitus without complications: Secondary | ICD-10-CM

## 2016-11-13 DIAGNOSIS — E785 Hyperlipidemia, unspecified: Secondary | ICD-10-CM | POA: Diagnosis not present

## 2016-11-13 LAB — BAYER DCA HB A1C WAIVED: HB A1C (BAYER DCA - WAIVED): 6.5 % (ref <7.0)

## 2016-11-13 MED ORDER — HUMALOG 100 UNIT/ML ~~LOC~~ SOLN: SUBCUTANEOUS | 5 refills | Status: DC

## 2016-11-13 MED ORDER — ROSUVASTATIN CALCIUM 10 MG PO TABS: 10.0000 mg | ORAL_TABLET | Freq: Every day | ORAL | 1 refills | Status: DC

## 2016-11-13 MED ORDER — LISINOPRIL-HYDROCHLOROTHIAZIDE 20-12.5 MG PO TABS: 1.0000 | ORAL_TABLET | Freq: Every day | ORAL | 1 refills | Status: DC

## 2016-11-13 NOTE — Patient Instructions (Signed)
Diabetes Mellitus and Exercise Exercising regularly is important for your overall health, especially when you have diabetes (diabetes mellitus). Exercising is not only about losing weight. It has many health benefits, such as increasing muscle strength and bone density and reducing body fat and stress. This leads to improved fitness, flexibility, and endurance, all of which result in better overall health. Exercise has additional benefits for people with diabetes, including:  Reducing appetite.  Helping to lower and control blood glucose.  Lowering blood pressure.  Helping to control amounts of fatty substances (lipids) in the blood, such as cholesterol and triglycerides.  Helping the body to respond better to insulin (improving insulin sensitivity).  Reducing how much insulin the body needs.  Decreasing the risk for heart disease by:  Lowering cholesterol and triglyceride levels.  Increasing the levels of good cholesterol.  Lowering blood glucose levels. What is my activity plan? Your health care provider or certified diabetes educator can help you make a plan for the type and frequency of exercise (activity plan) that works for you. Make sure that you:  Do at least 150 minutes of moderate-intensity or vigorous-intensity exercise each week. This could be brisk walking, biking, or water aerobics.  Do stretching and strength exercises, such as yoga or weightlifting, at least 2 times a week.  Spread out your activity over at least 3 days of the week.  Get some form of physical activity every day.  Do not go more than 2 days in a row without some kind of physical activity.  Avoid being inactive for more than 90 minutes at a time. Take frequent breaks to walk or stretch.  Choose a type of exercise or activity that you enjoy, and set realistic goals.  Start slowly, and gradually increase the intensity of your exercise over time. What do I need to know about managing my  diabetes?  Check your blood glucose before and after exercising.  If your blood glucose is higher than 240 mg/dL (13.3 mmol/L) before you exercise, check your urine for ketones. If you have ketones in your urine, do not exercise until your blood glucose returns to normal.  Know the symptoms of low blood glucose (hypoglycemia) and how to treat it. Your risk for hypoglycemia increases during and after exercise. Common symptoms of hypoglycemia can include:  Hunger.  Anxiety.  Sweating and feeling clammy.  Confusion.  Dizziness or feeling light-headed.  Increased heart rate or palpitations.  Blurry vision.  Tingling or numbness around the mouth, lips, or tongue.  Tremors or shakes.  Irritability.  Keep a rapid-acting carbohydrate snack available before, during, and after exercise to help prevent or treat hypoglycemia.  Avoid injecting insulin into areas of the body that are going to be exercised. For example, avoid injecting insulin into:  The arms, when playing tennis.  The legs, when jogging.  Keep records of your exercise habits. Doing this can help you and your health care provider adjust your diabetes management plan as needed. Write down:  Food that you eat before and after you exercise.  Blood glucose levels before and after you exercise.  The type and amount of exercise you have done.  When your insulin is expected to peak, if you use insulin. Avoid exercising at times when your insulin is peaking.  When you start a new exercise or activity, work with your health care provider to make sure the activity is safe for you, and to adjust your insulin, medicines, or food intake as needed.  Drink plenty   of water while you exercise to prevent dehydration or heat stroke. Drink enough fluid to keep your urine clear or pale yellow. This information is not intended to replace advice given to you by your health care provider. Make sure you discuss any questions you have with  your health care provider. Document Released: 11/10/2003 Document Revised: 03/09/2016 Document Reviewed: 01/30/2016 Elsevier Interactive Patient Education  2017 [employer]  

## 2016-11-13 NOTE — Progress Notes (Signed)
Subjective:    Patient ID: Maria Conley, female    DOB: May 08, 1966, 51 y.o.   MRN: [id number]  HPI: Patient here for a follow up on chronic medical problems. NO changes since last visit. NO complaints today.  Outpatient Encounter Prescriptions as of 11/13/2016  Medication Sig  . aspirin EC 81 MG tablet Take 81 mg by mouth daily.  Marland Kitchen glucose blood (BAYER CONTOUR NEXT TEST) test strip USE AS DIRECTED 4 TO 5 TIMES DAILY AND AS NEEDED  . HUMALOG 100 UNIT/ML injection USE UP TO 50 UNITS ONCE DAILY IN INSULIN PUMP  . Insulin Infusion Pump Supplies (MINIMED INFUSION SET-MMT 399) MISC Use with insulin pump. Change infusion set q 3 days.  Marland Kitchen lisinopril-hydrochlorothiazide (PRINZIDE,ZESTORETIC) 20-12.5 MG tablet Take 1 tablet by mouth daily.  . rosuvastatin (CRESTOR) 10 MG tablet Take 1 tablet (10 mg total) by mouth daily.   No facility-administered encounter medications on file as of 11/13/2016.     Diabetes  She presents for her follow-up diabetic visit. She has type 2 diabetes mellitus. No MedicAlert identification noted. Her disease course has been stable. There are no hypoglycemic associated symptoms. There are no diabetic associated symptoms. Pertinent negatives for diabetes include no chest pain, no fatigue, no polydipsia and no polyphagia. Symptoms are stable. There are no diabetic complications. Risk factors for coronary artery disease include dyslipidemia, diabetes mellitus and hypertension. Current diabetic treatment includes insulin pump. She is compliant with treatment most of the time. Her weight is stable. She has not had a previous visit with a dietitian. She participates in exercise every other day. Home blood sugar record trend: does not check blood sugars everyday. Her breakfast blood glucose is taken between 9-10 am. Her breakfast blood glucose range is generally 110-130 mg/dl. Her dinner blood glucose is taken between 7-8 pm. Her dinner blood glucose range is generally 110-130 mg/dl.  Her highest blood glucose is 180-200 mg/dl. Her overall blood glucose range is 140-180 mg/dl. An ACE inhibitor/angiotensin II receptor blocker is being taken. She does not see a podiatrist.Eye exam is not current.  Hyperlipidemia  This is a chronic problem. The current episode started more than 1 year ago. Recent lipid tests were reviewed and are variable. Exacerbating diseases include diabetes. She has no history of hypothyroidism or obesity. Pertinent negatives include no chest pain or shortness of breath. Current antihyperlipidemic treatment includes statins. The current treatment provides moderate improvement of lipids. Compliance problems include adherence to diet and adherence to exercise.  Risk factors for coronary artery disease include diabetes mellitus, dyslipidemia, hypertension and post-menopausal.  Hypertension  This is a chronic problem. The current episode started more than 1 year ago. The problem is controlled. Pertinent negatives include no chest pain or shortness of breath. Risk factors for coronary artery disease include diabetes mellitus, dyslipidemia and post-menopausal state. Past treatments include diuretics and ACE inhibitors. The current treatment provides moderate improvement. Compliance problems include diet and exercise.  There is no history of kidney disease or CAD/MI. There is no history of a thyroid problem.      Review of Systems  Constitutional: Negative.  Negative for fatigue.  HENT: Negative.   Eyes: Negative.   Respiratory: Negative.  Negative for shortness of breath.   Cardiovascular: Negative.  Negative for chest pain.  Gastrointestinal: Negative.   Endocrine: Negative.  Negative for polydipsia and polyphagia.  Genitourinary: Negative.   Musculoskeletal: Negative.   Skin: Negative.   Allergic/Immunologic: Negative.   Neurological: Negative.   Hematological: Negative.  Psychiatric/Behavioral: Negative.   All other systems reviewed and are negative.       Objective:   Physical Exam  Constitutional: She is oriented to person, place, and time. She appears well-developed and well-nourished.  HENT:  Nose: Nose normal.  Mouth/Throat: Oropharynx is clear and moist.  Eyes: EOM are normal.  Neck: Trachea normal, normal range of motion and full passive range of motion without pain. Neck supple. No JVD present. Carotid bruit is not present. No thyromegaly present.  Cardiovascular: Normal rate, regular rhythm, normal heart sounds and intact distal pulses.  Exam reveals no gallop and no friction rub.   No murmur heard. Pulmonary/Chest: Effort normal and breath sounds normal.  Abdominal: Soft. Bowel sounds are normal. She exhibits no distension and no mass. There is no tenderness.  Musculoskeletal: Normal range of motion.  Lymphadenopathy:    She has no cervical adenopathy.  Neurological: She is alert and oriented to person, place, and time. She has normal reflexes.  Skin: Skin is warm and dry.  Psychiatric: She has a normal mood and affect. Her behavior is normal. Judgment and thought content normal.   BP 119/72   Pulse 73   Temp 98 F (36.7 C) (Oral)   Ht '5\' 5"'  (1.651 m)   Wt 189 lb (85.7 kg)   BMI 31.45 kg/m    hgba1c today  6.5% down from  6.7% at last visit  Assessment & Plan:   1. Type 1 diabetes mellitus without complication (HCC) Continue to watch carbs in diet - Bayer DCA Hb A1c Waived - HUMALOG 100 UNIT/ML injection; USE UP TO 50 UNITS ONCE DAILY IN INSULIN PUMP  Dispense: 60 mL; Refill: 5  2. Hyperlipidemia with target LDL less than 100 Low fta diet - Lipid panel - rosuvastatin (CRESTOR) 10 MG tablet; Take 1 tablet (10 mg total) by mouth daily.  Dispense: 90 tablet; Refill: 1  3. Benign hypertensive heart disease without heart failure Dash diet - CMP14+EGFR - lisinopril-hydrochlorothiazide (PRINZIDE,ZESTORETIC) 20-12.5 MG tablet; Take 1 tablet by mouth daily.  Dispense: 90 tablet; Refill: 1    Labs pending Health  maintenance reviewed Diet and exercise encouraged Continue all meds Follow up  In 3 months   [place], FNP

## 2016-11-14 LAB — CMP14+EGFR
A/G RATIO: 2 (ref 1.2–2.2)
ALT: 10 IU/L (ref 0–32)
AST: 14 IU/L (ref 0–40)
Albumin: 4.2 g/dL (ref 3.5–5.5)
Alkaline Phosphatase: 54 IU/L (ref 39–117)
BUN/Creatinine Ratio: 11 (ref 9–23)
BUN: 9 mg/dL (ref 6–24)
Bilirubin Total: 0.4 mg/dL (ref 0.0–1.2)
CALCIUM: 8.9 mg/dL (ref 8.7–10.2)
CO2: 26 mmol/L (ref 18–29)
CREATININE: 0.81 mg/dL (ref 0.57–1.00)
Chloride: 100 mmol/L (ref 96–106)
GFR calc non Af Amer: 84 mL/min/{1.73_m2} (ref >59)
GFR, EST AFRICAN AMERICAN: 97 mL/min/{1.73_m2} (ref >59)
GLOBULIN, TOTAL: 2.1 g/dL (ref 1.5–4.5)
Glucose: 107 mg/dL — ABNORMAL HIGH (ref 65–99)
POTASSIUM: 3.9 mmol/L (ref 3.5–5.2)
Sodium: 140 mmol/L (ref 134–144)
TOTAL PROTEIN: 6.3 g/dL (ref 6.0–8.5)

## 2016-11-14 LAB — LIPID PANEL
CHOL/HDL RATIO: 2.8 ratio (ref 0.0–4.4)
Cholesterol, Total: 156 mg/dL (ref 100–199)
HDL: 56 mg/dL (ref >39)
LDL Calculated: 86 mg/dL (ref 0–99)
Triglycerides: 71 mg/dL (ref 0–149)
VLDL Cholesterol Cal: 14 mg/dL (ref 5–40)

## 2016-11-23 ENCOUNTER — Other Ambulatory Visit: Payer: Self-pay | Admitting: Pharmacist

## 2016-11-23 ENCOUNTER — Ambulatory Visit: Payer: Self-pay | Admitting: Pharmacist

## 2016-11-23 NOTE — Patient Outreach (Signed)
[facility]) Care Management  11/23/2016  [name] 09/18/1965 [id number]  Received call from patient stating that she needs to reschedule her employee sponsored Link to Wellness diabetes program visit at [facility] for Today.  Will send an email to patient with dates to reschedule her Link to Wellness appointment.    Hazle NordmannKelsy Darriel Sinquefield, PharmD, BCPS [facility] PGY2 Pharmacy Resident [phone]

## 2016-12-10 DIAGNOSIS — E109 Type 1 diabetes mellitus without complications: Secondary | ICD-10-CM | POA: Diagnosis not present

## 2016-12-10 DIAGNOSIS — E108 Type 1 diabetes mellitus with unspecified complications: Secondary | ICD-10-CM | POA: Diagnosis not present

## 2016-12-10 MED FILL — HumaLOG 100 UNIT/ML SOLN: 100 | 90 days supply | Qty: 50 | Fill #0

## 2016-12-10 MED FILL — ROSUVASTATIN CALCIUM 10 MG: 10 | 90 days supply | Qty: 90 | Fill #1

## 2016-12-10 MED FILL — LISINOPRIL-HCTZ 20-12.5 MG: 20-12.5 | 90 days supply | Qty: 90 | Fill #1

## 2016-12-18 DIAGNOSIS — E109 Type 1 diabetes mellitus without complications: Secondary | ICD-10-CM | POA: Diagnosis not present

## 2016-12-18 DIAGNOSIS — E108 Type 1 diabetes mellitus with unspecified complications: Secondary | ICD-10-CM | POA: Diagnosis not present

## 2017-01-07 ENCOUNTER — Encounter: Payer: Self-pay | Admitting: Nurse Practitioner

## 2017-01-09 ENCOUNTER — Encounter: Payer: Self-pay | Admitting: Pharmacist

## 2017-01-09 ENCOUNTER — Other Ambulatory Visit: Payer: Self-pay | Admitting: Pharmacist

## 2017-01-09 NOTE — Patient Outreach (Signed)
[facility]) Care Management  [place]   01/09/2017  Maria Conley 28-Jan-1966 [id number]  Subjective: Patient presents today for diabetes follow-up as part of the employer-sponsored Link to Wellness program.  Current diabetes regimen includes Humalog via Minimed Insulin Pump.  Patient also continues on daily aspirin, lisinopril and rosuvastatin.  Most recent MD follow-up was 11/13/16.  Patient has a pending appt for 02/25/17.  No med changes or major health changes at this time.   Today she reports she has been having some muscle spasms in back which Has previously be relieved by steroids.  She reports this issue went on for 3 days then resolved.  She has followup with her primary care in June and plans to discuss this.    She reports changing her sensor every 3-4 days but never uses bolus wizard.  She doesn't wear sensor all the time due to feeling like it isn't calibrated and it shuts off incorrectly at night when it thinks she is <60 mg/dL but when she checks her CBG it is >100.    Patient reported dietary habits: States she is 30% low carbohydrate.  She states potatoes is her biggest weakness.    Patient reported exercise habits: Walk 1 mile per day for 5 days per week.   Patient reports hypoglycemic events 1x/week.  Has symptoms of shakiness/sweaty (starts in 40-50s).   Patient reports nocturia 1x/night.  Patient denies pain/burning upon urination.  Patient denies neuropathy. Patient denies visual changes. Last eye exam March 2017.  Last Dental Exam 1.5 years ago.  Patient reports self foot exams. Denies changes.    Patient reported self monitored blood glucose frequency 3-4 times per day. She did not bring meter to visit today.  Home fasting CBG: 92 Mostly seeing CBGs in 120s.    Objective:  11/14/16 Hemoglobin A1C: 6.5%   Vitals:   01/09/17 0950  BP: 121/75  Pulse: 76   Lipid Panel     Component Value Date/Time   CHOL 156 11/13/2016 1213    TRIG 71 11/13/2016 1213   TRIG 84 11/11/2014 0856   HDL 56 11/13/2016 1213   HDL 52 11/11/2014 0856   CHOLHDL 2.8 11/13/2016 1213   LDLCALC 86 11/13/2016 1213   LDLCALC 102 (H) 05/12/2014 1317   Encounter Medications: Outpatient Encounter Prescriptions as of 01/09/2017  Medication Sig  . aspirin EC 81 MG tablet Take 81 mg by mouth daily.  Marland Kitchen glucose blood (BAYER CONTOUR NEXT TEST) test strip USE AS DIRECTED 4 TO 5 TIMES DAILY AND AS NEEDED  . HUMALOG 100 UNIT/ML injection USE UP TO 50 UNITS ONCE DAILY IN INSULIN PUMP  . Insulin Infusion Pump Supplies (MINIMED INFUSION SET-MMT 399) MISC Use with insulin pump. Change infusion set q 3 days.  Marland Kitchen lisinopril-hydrochlorothiazide (PRINZIDE,ZESTORETIC) 20-12.5 MG tablet Take 1 tablet by mouth daily.  . rosuvastatin (CRESTOR) 10 MG tablet Take 1 tablet (10 mg total) by mouth daily.   No facility-administered encounter medications on file as of 01/09/2017.     Functional Status: In your present state of health, do you have any difficulty performing the following activities: 01/09/2017 04/10/2016  Hearing? N N  Vision? - N  Difficulty concentrating or making decisions? - N  Walking or climbing stairs? - N  Dressing or bathing? - N  Doing errands, shopping? - N  Some recent data might be hidden    Fall/Depression Screening: Fall Risk  01/09/2017 11/13/2016 08/10/2016  Falls in the past year? No No  No  Risk for fall due to : - - -   PHQ 2/9 Scores 01/09/2017 11/13/2016 08/10/2016 07/12/2016 05/29/2016 04/10/2016 04/03/2016  PHQ - 2 Score 0 0 0 0 0 0 0     Assessment: Diabetes: Most recent A1C was 6.5% which is at at goal of less than 7%. Weight is decreased from last visit with me. Patient continues on aspirin, statin and ACE inhibitor.  She does report hypoglycemic episodes with awareness ~1 time per week. She doesn't wear sensor all the time due to feeling like it isn't calibrated and it shuts off incorrectly at night when it thinks she is <60 mg/dL but when  she checks her CBG it is >100.    Plan/Goals for Next Visit: Patient will schedule eye exam  Discussed low carbohydrate diet, exercise and treatment of hypoglycemia Patient set goal to wear sensor more often and followup with [facility] about resetting low threshold.  Next appointment to see me is: 3 months (telephonic)    Bennye Alm, PharmD [facility] PGY2 Pharmacy Resident [phone]  [facility] CM Care Plan Problem One     Most Recent Value  Care Plan Problem One  Knowledge deficit with carbohydrate counting and always utilizing bolus wizard as evidenced by frequent hypoglycemia  Role Documenting the Problem One  Clinical Pharmacist  Care Plan for Problem One  Active  [facility] Long Term Goal (31-90 days)  Patient will wear sensor >80% of time as measured by patient report over the next 90 days  [facility] Long Term Goal Start Date  01/09/17  Interventions for Problem One Long Term Goal  Patient will followup with Jeanie Sewer about resetting hypoglycemia limit on sensor and for calibration.  Discussed risk of hypoglycemia.   [facility] CM Short Term Goal #1 (0-30 days)  Patient will schedule dietician education to review carbohydrate counting with her insulin pump in the next 30 days  [facility] CM Short Term Goal #1 Start Date  04/10/16  [facility] CM Short Term Goal #1 Met Date  07/24/16  Interventions for Short Term Goal #1  Reviewed signs/symptoms and treatment of hypoglycemia utilizing rule of 15s. Counseled on importance of utilizing bolus wizard to avoid overcorrecting highs. Counseled on low carbohydrate diet, portion sizes, and how to count carbohydrates. Patient plans to followup with Jeanie Sewer for further education and review on carbohydrate counting with her pump.     [facility] CM Care Plan Problem Two     Most Recent Value  Care Plan Problem Two  Weight management as evidenced by patient weight above goal weigh  Role Documenting the Problem Two  Clinical Pharmacist  Care Plan for Problem Two  Active   Interventions for Problem Two Long Term Goal   Counseled on low carb diet.  Patient to make followup appointment with dietician.  Encouraged patient to continue to walk 5 days per week.  [facility] Long Term Goal (31-90) days  Patient will continue low carbohydrate diet utilizing whole 30 principles and walking 5 days per week to achieve a 10 lb weight loss over the next 90 days  [facility] Long Term Goal Start Date  01/09/17

## 2017-02-25 ENCOUNTER — Ambulatory Visit: Payer: 59 | Admitting: Nurse Practitioner

## 2017-03-07 MED FILL — HumaLOG 100 UNIT/ML SOLN: 100 | 90 days supply | Qty: 50 | Fill #1

## 2017-03-12 ENCOUNTER — Ambulatory Visit (INDEPENDENT_AMBULATORY_CARE_PROVIDER_SITE_OTHER): Payer: 59 | Admitting: Nurse Practitioner

## 2017-03-12 ENCOUNTER — Encounter: Payer: Self-pay | Admitting: Nurse Practitioner

## 2017-03-12 VITALS — BP 115/67 | HR 85 | Temp 98.5°F | Ht 65.0 in | Wt 189.8 lb

## 2017-03-12 DIAGNOSIS — Z8774 Personal history of (corrected) congenital malformations of heart and circulatory system: Secondary | ICD-10-CM

## 2017-03-12 DIAGNOSIS — Z6831 Body mass index (BMI) 31.0-31.9, adult: Secondary | ICD-10-CM | POA: Diagnosis not present

## 2017-03-12 DIAGNOSIS — E109 Type 1 diabetes mellitus without complications: Secondary | ICD-10-CM

## 2017-03-12 DIAGNOSIS — Z9889 Other specified postprocedural states: Secondary | ICD-10-CM

## 2017-03-12 DIAGNOSIS — E785 Hyperlipidemia, unspecified: Secondary | ICD-10-CM | POA: Diagnosis not present

## 2017-03-12 DIAGNOSIS — I119 Hypertensive heart disease without heart failure: Secondary | ICD-10-CM

## 2017-03-12 LAB — BAYER DCA HB A1C WAIVED: HB A1C: 6.5 % (ref <7.0)

## 2017-03-12 MED ORDER — LISINOPRIL-HYDROCHLOROTHIAZIDE 20-12.5 MG PO TABS: 1.0000 | ORAL_TABLET | Freq: Every day | ORAL | 1 refills | Status: DC

## 2017-03-12 MED ORDER — HUMALOG 100 UNIT/ML ~~LOC~~ SOLN: SUBCUTANEOUS | 5 refills | Status: DC

## 2017-03-12 MED ORDER — ROSUVASTATIN CALCIUM 10 MG PO TABS
10.0000 mg | ORAL_TABLET | Freq: Every day | ORAL | 1 refills | Status: DC
Start: 2017-03-12 — End: 2017-10-17

## 2017-03-12 NOTE — Patient Instructions (Signed)

## 2017-03-12 NOTE — Progress Notes (Signed)
Subjective:    Patient ID: Maria Conley, female    DOB: [birth year], 51 y.o.   MRN: [id number]  HPI  Maria Conley is here today for follow up of chronic medical problem.  Outpatient Encounter Prescriptions as of 03/12/2017  Medication Sig  . aspirin EC 81 MG tablet Take 81 mg by mouth daily.  Marland Kitchen glucose blood (BAYER CONTOUR NEXT TEST) test strip USE AS DIRECTED 4 TO 5 TIMES DAILY AND AS NEEDED  . HUMALOG 100 UNIT/ML injection USE UP TO 50 UNITS ONCE DAILY IN INSULIN PUMP  . Insulin Infusion Pump Supplies (MINIMED INFUSION SET-MMT 399) MISC Use with insulin pump. Change infusion set q 3 days.  Marland Kitchen lisinopril-hydrochlorothiazide (PRINZIDE,ZESTORETIC) 20-12.5 MG tablet Take 1 tablet by mouth daily.  . rosuvastatin (CRESTOR) 10 MG tablet Take 1 tablet (10 mg total) by mouth daily.   No facility-administered encounter medications on file as of 03/12/2017.     1. Type 1 diabetes mellitus without complication (HCC)  Last HGBA1c was 6.5%. Blood suf=gars have been running good. Has insulin pump.  2. Hyperlipidemia with target LDL less than 100  Watches diet and tries to exercise a couple of times aweek  3. BMI 31.0-31.9,adult   no recent weiht changes  4. Benign hypertensive heart disease without heart failure  Sees cardiology yearly- no c/o chest pain or SOB  5. S/P atrial septal defect closure  No problems that she is aware of    New complaints: None today     Review of Systems  Constitutional: Negative for activity change and appetite change.  HENT: Negative.   Eyes: Negative for pain.  Respiratory: Negative for shortness of breath.   Cardiovascular: Negative for chest pain, palpitations and leg swelling.  Gastrointestinal: Negative for abdominal pain.  Endocrine: Negative for polydipsia.  Genitourinary: Negative.   Skin: Negative for rash.  Neurological: Negative for dizziness, weakness and headaches.  Hematological: Does not bruise/bleed easily.    Psychiatric/Behavioral: Negative.   All other systems reviewed and are negative.      Objective:   Physical Exam  Constitutional: She is oriented to person, place, and time. She appears well-developed and well-nourished.  HENT:  Nose: Nose normal.  Mouth/Throat: Oropharynx is clear and moist.  Eyes: EOM are normal.  Neck: Trachea normal, normal range of motion and full passive range of motion without pain. Neck supple. No JVD present. Carotid bruit is not present. No thyromegaly present.  Cardiovascular: Normal rate, regular rhythm and intact distal pulses.  Exam reveals no gallop and no friction rub.   Murmur (3/6 systolic) heard. Pulmonary/Chest: Effort normal and breath sounds normal.  Abdominal: Soft. Bowel sounds are normal. She exhibits no distension and no mass. There is no tenderness.  Musculoskeletal: Normal range of motion.  Lymphadenopathy:    She has no cervical adenopathy.  Neurological: She is alert and oriented to person, place, and time. She has normal reflexes.  Skin: Skin is warm and dry.  Psychiatric: She has a normal mood and affect. Her behavior is normal. Judgment and thought content normal.   BP 115/67   Pulse 85   Temp 98.5 F (36.9 C) (Oral)   Ht '5\' 5"'  (1.651 m)   Wt 189 lb 12.8 oz (86.1 kg)   BMI 31.58 kg/m   hgba1c 6.5%        Assessment & Plan:  1. Type 1 diabetes mellitus without complication (HCC) Continue to watch carbs in diet - CMP14+EGFR - Lipid panel - Bayer  DCA Hb A1c Waived - HUMALOG 100 UNIT/ML injection; USE UP TO 50 UNITS ONCE DAILY IN INSULIN PUMP  Dispense: 60 mL; Refill: 5  2. Hyperlipidemia with target LDL less than 100 Low fat diet - CMP14+EGFR - Lipid panel - Bayer DCA Hb A1c Waived - rosuvastatin (CRESTOR) 10 MG tablet; Take 1 tablet (10 mg total) by mouth daily.  Dispense: 90 tablet; Refill: 1  3. BMI 31.0-31.9,adult Discussed diet and exercise for person with BMI >25 Will recheck weight in 3-6 months -  CMP14+EGFR - Lipid panel - Bayer DCA Hb A1c Waived  4. Benign hypertensive heart disease without heart failure Keep follow up with cardiology - lisinopril-hydrochlorothiazide (PRINZIDE,ZESTORETIC) 20-12.5 MG tablet; Take 1 tablet by mouth daily.  Dispense: 90 tablet; Refill: 1  5. S/P atrial septal defect closure    Labs pending Health maintenance reviewed Diet and exercise encouraged Continue all meds Follow up  In 3 month   [place], FNP

## 2017-03-13 LAB — CMP14+EGFR
A/G RATIO: 2 (ref 1.2–2.2)
ALK PHOS: 63 IU/L (ref 39–117)
ALT: 14 IU/L (ref 0–32)
AST: 13 IU/L (ref 0–40)
Albumin: 4.3 g/dL (ref 3.5–5.5)
BUN / CREAT RATIO: 12 (ref 9–23)
BUN: 10 mg/dL (ref 6–24)
Bilirubin Total: 0.5 mg/dL (ref 0.0–1.2)
CO2: 26 mmol/L (ref 20–29)
CREATININE: 0.85 mg/dL (ref 0.57–1.00)
Calcium: 9.7 mg/dL (ref 8.7–10.2)
Chloride: 100 mmol/L (ref 96–106)
GFR calc Af Amer: 91 mL/min/{1.73_m2} (ref >59)
GFR, EST NON AFRICAN AMERICAN: 79 mL/min/{1.73_m2} (ref >59)
Globulin, Total: 2.2 g/dL (ref 1.5–4.5)
Glucose: 104 mg/dL — ABNORMAL HIGH (ref 65–99)
Potassium: 4.2 mmol/L (ref 3.5–5.2)
SODIUM: 142 mmol/L (ref 134–144)
Total Protein: 6.5 g/dL (ref 6.0–8.5)

## 2017-03-13 LAB — LIPID PANEL
CHOL/HDL RATIO: 2.5 ratio (ref 0.0–4.4)
CHOLESTEROL TOTAL: 158 mg/dL (ref 100–199)
HDL: 62 mg/dL (ref >39)
LDL Calculated: 78 mg/dL (ref 0–99)
Triglycerides: 89 mg/dL (ref 0–149)
VLDL Cholesterol Cal: 18 mg/dL (ref 5–40)

## 2017-03-19 MED FILL — ROSUVASTATIN CALCIUM 10 MG: 10 | 90 days supply | Qty: 90 | Fill #0

## 2017-03-19 MED FILL — CONTOUR NEXT STRIPS: 60 days supply | Qty: 300 | Fill #1

## 2017-03-19 MED FILL — LISINOPRIL-HCTZ 20-12.5 MG: 20-12.5 | 90 days supply | Qty: 90 | Fill #0

## 2017-03-20 DIAGNOSIS — E109 Type 1 diabetes mellitus without complications: Secondary | ICD-10-CM | POA: Diagnosis not present

## 2017-03-20 DIAGNOSIS — E108 Type 1 diabetes mellitus with unspecified complications: Secondary | ICD-10-CM | POA: Diagnosis not present

## 2017-03-28 DIAGNOSIS — E109 Type 1 diabetes mellitus without complications: Secondary | ICD-10-CM | POA: Diagnosis not present

## 2017-03-28 DIAGNOSIS — E108 Type 1 diabetes mellitus with unspecified complications: Secondary | ICD-10-CM | POA: Diagnosis not present

## 2017-04-12 ENCOUNTER — Other Ambulatory Visit: Payer: Self-pay | Admitting: Pharmacist

## 2017-04-12 NOTE — Patient Outreach (Signed)
Subjective: Patient presents today for 3 month diabetes follow-up as part of the employer-sponsored Link to Wellness program.  Current diabetes regimen includes humalog via pump + CGM.  Patient also continues on daily aspirin, lisinopril and rosuvastatin.  Patient endorses compliance to medications. Most recent MD follow-up was 03/12/2017.  No med changes or major health changes at this time.    Has medtronic enlite sensor and medtronic pump (630G) but doesn't wear CGM continuously (~50% of the time). Rise Paganini, RN educator will adjust pump settings when needed. Changing pump tubing every 3 days, rotating sites. Does not use bolus wizard. Figures carbs and adds in correction bolus usually. Has done better with this and subsequently has experienced less hypoglycemic episodes. Reports that she used to be very "OCD" about having her BG <150 all the time.   Patient reported dietary habits: Eats 2-3 meals/day Breakfast: watermelon Lunch: ham sandwich with white wheat bread Dinner: grilled chicken, zucchini, mushrooms, asparagus Snacks: lance crackers, fruit Drinks: water, coffee with tsp sugar, sweet tea (puts 1/2 C sugar in 1 gallon)  Patient reported exercise habits: walk ~ 1 mile 5 days/week (usually about 30 minutes)  Patient reports hypoglycemic events. ~ 1x/week, demonstrates correct treatment with soda/juice/hard candy Patient denies nocturia ~1x/night. Stable.  Patient denies pain/burning upon urination.  Patient denies neuropathy. Patient denies visual changes. Patient reports self foot exams. No issues.  Patient reported self monitored blood glucose frequency  - 3-4 times daily when not wearing sensor (usually isn't wearing sensor)  Home fasting CBG: 70-130; 72 today, generally less than 100   Objective:  Weight stable ~186 lbs per patient report A1C = 6.5 as of 03/12/2017 per chart review  Lipid Panel     Component Value Date/Time   CHOL 158 03/12/2017 1425   TRIG 89 03/12/2017  1425   TRIG 84 11/11/2014 0856   HDL 62 03/12/2017 1425   HDL 52 11/11/2014 0856   CHOLHDL 2.5 03/12/2017 1425   LDLCALC 78 03/12/2017 1425   LDLCALC 102 (H) 05/12/2014 1317   Wt Readings from Last 3 Encounters:  03/12/17 189 lb 12.8 oz (86.1 kg)  01/09/17 183 lb (83 kg)  11/13/16 189 lb (85.7 kg)   Temp Readings from Last 3 Encounters:  03/12/17 98.5 F (36.9 C) (Oral)  11/13/16 98 F (36.7 C) (Oral)  07/12/16 98.1 F (36.7 C) (Oral)   BP Readings from Last 3 Encounters:  03/12/17 115/67  01/09/17 121/75  11/13/16 119/72   Pulse Readings from Last 3 Encounters:  03/12/17 85  01/09/17 76  11/13/16 73   10 year ASCVD risk: 2.0%   Last eye exam: 02/2016 Last dental exam: 04/2016  Assessment: Diabetes: controlled - A1C 6.5% which is at goal of less than 7%.  - Weight is up 3 lbs from last visit with Link to Wellness   Physical Activity- adequate - At goal of at least 150 minutes of moderate intensity exercise weekly  Nutrition- adequate, limited by busy work schedule - Maintaining adherence to diabetic diet 90% of the time  Plan/Goals for Next Visit: - Counseled on s/sx hypoglycemia and appropriate treatment - Diet goal: Continue low-carb diet to help lose weight and maintain control of DM - Exercise goal: Continue walking 30 mins 5x/week, add in strength training 2x/week  - Counseled on benefit of using CGM more consistently  - Follow-up: dental exam, eye exam  - Detailed goals and action plans are listed below  Next appointment to see Link To Wellness: 07/15/17 with Mendel Ryder  Lenord Fellers, PharmD, PGY1 Pharmacy Resident  Deirdre Pippins, PharmD [facility] PGY2 Pharmacy Resident [phone]  [facility] CM Care Plan Problem One     Most Recent Value  Care Plan Problem One  Knowledge deficit with carbohydrate counting and always utilizing bolus wizard as evidenced by frequent hypoglycemia  Role Documenting the Problem One  Clinical Pharmacist  Care Plan for Problem One   Active  [facility] Long Term Goal   Patient will use bolus wizard and count carbohydrates carefully before bolusing for food over the next 90 days as evidenced by patient report and hypoglycemia event rate  [facility] Long Term Goal Start Date  01/09/17  [facility] Long Term Goal Met Date  04/12/17  Interventions for Problem One Long Term Goal  Patient will wear CGM more often and send results to RN Educator for adjustments if fastings are in 70s more than once/week  [facility] CM Short Term Goal #1   Patient will schedule dietician education to review carbohydrate counting with her insulin pump in the next 30 days  [facility] CM Short Term Goal #1 Start Date  04/10/16  [facility] CM Short Term Goal #1 Met Date  04/12/17  Interventions for Short Term Goal #1  Reviewed signs/symptoms and treatment of hypoglycemia utilizing rule of 15s. Counseled on importance of utilizing bolus wizard to avoid overcorrecting highs. Counseled on low carbohydrate diet, portion sizes, and how to count carbohydrates. Patient plans to followup with Jeanie Sewer for further education and review on carbohydrate counting with her pump.     [facility] CM Care Plan Problem Two     Most Recent Value  Care Plan Problem Two  Weight management as evidenced by patient weight above goal weigh  Role Documenting the Problem Two  Clinical Pharmacist  Care Plan for Problem Two  Active  Interventions for Problem Two Long Term Goal   Counseled on low carb diet.  Patient to make followup appointment with dietician.  Encouraged patient to continue to walk 5 days per week.  [facility] Long Term Goal  Patient will continue low carbohydrate diet utilizing whole 30 principles and walking 5 days per week and adding in 2 days/week of strength training to achieve a 5 lb weight loss over the next 90 days  [facility] Long Term Goal Start Date  01/09/17

## 2017-06-17 ENCOUNTER — Ambulatory Visit: Payer: 59 | Admitting: Nurse Practitioner

## 2017-06-25 MED FILL — HumaLOG 100 UNIT/ML SOLN: 100 | 90 days supply | Qty: 50 | Fill #2

## 2017-06-27 ENCOUNTER — Ambulatory Visit (INDEPENDENT_AMBULATORY_CARE_PROVIDER_SITE_OTHER): Payer: 59 | Admitting: Nurse Practitioner

## 2017-06-27 ENCOUNTER — Encounter: Payer: Self-pay | Admitting: Nurse Practitioner

## 2017-06-27 VITALS — BP 123/74 | HR 55 | Temp 98.0°F | Ht 65.0 in | Wt 193.0 lb

## 2017-06-27 DIAGNOSIS — M545 Low back pain, unspecified: Secondary | ICD-10-CM | POA: Insufficient documentation

## 2017-06-27 DIAGNOSIS — I119 Hypertensive heart disease without heart failure: Secondary | ICD-10-CM

## 2017-06-27 DIAGNOSIS — Z6831 Body mass index (BMI) 31.0-31.9, adult: Secondary | ICD-10-CM

## 2017-06-27 DIAGNOSIS — E785 Hyperlipidemia, unspecified: Secondary | ICD-10-CM

## 2017-06-27 DIAGNOSIS — E109 Type 1 diabetes mellitus without complications: Secondary | ICD-10-CM

## 2017-06-27 LAB — BAYER DCA HB A1C WAIVED: HB A1C (BAYER DCA - WAIVED): 6.5 % (ref <7.0)

## 2017-06-27 MED ORDER — PREDNISONE 10 MG (21) PO TBPK: ORAL_TABLET | ORAL | 0 refills | Status: DC

## 2017-06-27 NOTE — Progress Notes (Signed)
Subjective:    Patient ID: Maria Conley, female    DOB: July 31, 1966, 51 y.o.   MRN: [id number]  HPI Maria Conley is here today for follow up of chronic medical problem.  Outpatient Encounter Prescriptions as of 06/27/2017  Medication Sig  . aspirin EC 81 MG tablet Take 81 mg by mouth daily.  Marland Kitchen glucose blood (BAYER CONTOUR NEXT TEST) test strip USE AS DIRECTED 4 TO 5 TIMES DAILY AND AS NEEDED  . HUMALOG 100 UNIT/ML injection USE UP TO 50 UNITS ONCE DAILY IN INSULIN PUMP  . Insulin Infusion Pump Supplies (MINIMED INFUSION SET-MMT 399) MISC Use with insulin pump. Change infusion set q 3 days.  Marland Kitchen lisinopril-hydrochlorothiazide (PRINZIDE,ZESTORETIC) 20-12.5 MG tablet Take 1 tablet by mouth daily.  . rosuvastatin (CRESTOR) 10 MG tablet Take 1 tablet (10 mg total) by mouth daily.   No facility-administered encounter medications on file as of 06/27/2017.     1. Benign hypertensive heart disease without heart failure  Blood pressure well-controlled with Prinzide.  Patient does not check blood pressure at home.   2. Type 1 diabetes mellitus without complication ([place])  Manages blood glucose with humalog.  Patient checks blood glucose daily and values typically fasting is ~90.  Before meal BG is typically 130-140. Previous A1C 6.5% on 03/12/17.  3. Hyperlipidemia with target LDL less than 100  Managed with rosuvastatin.  LFTs monitored regularly.  4. BMI 31.0-31.9,adult  No significant weight gain or loss.    New complaints: Patient has been cutting up trees in her yard and has injured her lower back. 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  Respiratory: Negative for cough and shortness of breath.   Cardiovascular: Negative for chest pain and palpitations.  Neurological: Negative for dizziness, light-headedness and  headaches.  All other systems reviewed and are negative.      Objective:   Physical Exam  Constitutional: She is oriented to person, place, and time. She appears well-developed and well-nourished. No distress.  HENT:  Head: Normocephalic.  Right Ear: External ear normal.  Left Ear: External ear normal.  Mouth/Throat: Oropharynx is clear and moist.  Eyes: Pupils are equal, round, and reactive to light.  Neck: Normal range of motion. Neck supple. No thyromegaly present.  Cardiovascular: Normal rate, regular rhythm, normal heart sounds and intact distal pulses.   No murmur heard. Pulmonary/Chest: Effort normal and breath sounds normal. No respiratory distress. She has no wheezes.  Abdominal: Soft. Bowel sounds are normal. She exhibits no distension. There is no tenderness.  Musculoskeletal: Normal range of motion. She exhibits no edema.  FROM of lumbar spine with pain on flexion and extension (-) SLR Motor strength and sensation distally intact  Neurological: She is alert and oriented to person, place, and time.  Skin: Skin is warm and dry.  Psychiatric: She has a normal mood and affect. Her behavior is normal.   BP 123/74   Pulse (!) 55   Temp 98 F (36.7 C) (Oral)   Ht '5\' 5"'  (1.651 m)   Wt 193 lb (87.5 kg)   BMI 32.12 kg/m  A1C: 6.5%    Assessment & Plan:  1. Benign hypertensive heart disease without heart failure Low sodium diet - CMP14+EGFR  2. Type 1 diabetes mellitus without complication (HCC) Continue to watch carbs in diet - Bayer DCA Hb A1c  Waived - Microalbumin / creatinine urine ratio  3. Hyperlipidemia with target LDL less than 100 Low fat diet - Lipid panel  4. BMI 31.0-31.9,adult Discussed diet and exercise for person with BMI >25 Will recheck weight in 3-6 months  5. Acute midline low back pain without sciatica Watch blood sugars Adjust pump for increased blood sugars- patient has done this in the past to take prednisone - predniSONE (STERAPRED  UNI-PAK 21 TAB) 10 MG (21) TBPK tablet; As directed x 6 days  Dispense: 21 tablet; Refill: 0   Labs pending Health maintenance reviewed Diet and exercise encouraged Continue all meds Follow up  In 3 months   [place], FNP

## 2017-06-28 LAB — CMP14+EGFR
A/G RATIO: 2 (ref 1.2–2.2)
ALT: 11 IU/L (ref 0–32)
AST: 12 IU/L (ref 0–40)
Albumin: 4.3 g/dL (ref 3.5–5.5)
Alkaline Phosphatase: 58 IU/L (ref 39–117)
BUN / CREAT RATIO: 12 (ref 9–23)
BUN: 10 mg/dL (ref 6–24)
Bilirubin Total: 0.2 mg/dL (ref 0.0–1.2)
CO2: 24 mmol/L (ref 20–29)
Calcium: 9 mg/dL (ref 8.7–10.2)
Chloride: 101 mmol/L (ref 96–106)
Creatinine, Ser: 0.82 mg/dL (ref 0.57–1.00)
GFR calc non Af Amer: 83 mL/min/{1.73_m2} (ref >59)
GFR, EST AFRICAN AMERICAN: 95 mL/min/{1.73_m2} (ref >59)
Globulin, Total: 2.1 g/dL (ref 1.5–4.5)
Glucose: 55 mg/dL — ABNORMAL LOW (ref 65–99)
POTASSIUM: 3.7 mmol/L (ref 3.5–5.2)
Sodium: 139 mmol/L (ref 134–144)
TOTAL PROTEIN: 6.4 g/dL (ref 6.0–8.5)

## 2017-06-28 LAB — LIPID PANEL
CHOL/HDL RATIO: 2.4 ratio (ref 0.0–4.4)
Cholesterol, Total: 148 mg/dL (ref 100–199)
HDL: 61 mg/dL (ref >39)
LDL Calculated: 75 mg/dL (ref 0–99)
Triglycerides: 59 mg/dL (ref 0–149)
VLDL Cholesterol Cal: 12 mg/dL (ref 5–40)

## 2017-06-28 LAB — MICROALBUMIN / CREATININE URINE RATIO
CREATININE, UR: 52.9 mg/dL
Microalb/Creat Ratio: <5.7 mg/g creat (ref 0.0–30.0)
Microalbumin, Urine: <3 ug/mL

## 2017-07-01 DIAGNOSIS — E109 Type 1 diabetes mellitus without complications: Secondary | ICD-10-CM | POA: Diagnosis not present

## 2017-07-01 DIAGNOSIS — E108 Type 1 diabetes mellitus with unspecified complications: Secondary | ICD-10-CM | POA: Diagnosis not present

## 2017-07-02 MED FILL — predniSONE 10 MG (21) TBPK: 10 | 6 days supply | Qty: 21 | Fill #0

## 2017-07-08 DIAGNOSIS — E109 Type 1 diabetes mellitus without complications: Secondary | ICD-10-CM | POA: Diagnosis not present

## 2017-07-08 DIAGNOSIS — E108 Type 1 diabetes mellitus with unspecified complications: Secondary | ICD-10-CM | POA: Diagnosis not present

## 2017-07-11 MED FILL — ROSUVASTATIN CALCIUM 10 MG: 10 | 90 days supply | Qty: 90 | Fill #0

## 2017-07-11 MED FILL — LISINOPRIL-HCTZ 20-12.5 MG: 20-12.5 | 90 days supply | Qty: 90 | Fill #0

## 2017-07-15 ENCOUNTER — Ambulatory Visit: Payer: Self-pay | Admitting: Pharmacist

## 2017-08-08 DIAGNOSIS — Z1231 Encounter for screening mammogram for malignant neoplasm of breast: Secondary | ICD-10-CM | POA: Diagnosis not present

## 2017-08-08 DIAGNOSIS — R87610 Atypical squamous cells of undetermined significance on cytologic smear of cervix (ASC-US): Secondary | ICD-10-CM | POA: Diagnosis not present

## 2017-08-08 DIAGNOSIS — Z01419 Encounter for gynecological examination (general) (routine) without abnormal findings: Secondary | ICD-10-CM | POA: Diagnosis not present

## 2017-08-08 DIAGNOSIS — Z130 Encounter for screening for diseases of the blood and blood-forming organs and certain disorders involving the immune mechanism: Secondary | ICD-10-CM | POA: Diagnosis not present

## 2017-08-08 DIAGNOSIS — Z1389 Encounter for screening for other disorder: Secondary | ICD-10-CM | POA: Diagnosis not present

## 2017-08-08 DIAGNOSIS — Z124 Encounter for screening for malignant neoplasm of cervix: Secondary | ICD-10-CM | POA: Diagnosis not present

## 2017-08-08 DIAGNOSIS — Z1151 Encounter for screening for human papillomavirus (HPV): Secondary | ICD-10-CM | POA: Diagnosis not present

## 2017-09-21 MED FILL — HumaLOG 100 UNIT/ML SOLN: 100 | 90 days supply | Qty: 50 | Fill #3

## 2017-09-23 MED FILL — valACYclovir HCL 1 GM TABS: 1 | 30 days supply | Qty: 30 | Fill #0

## 2017-09-27 DIAGNOSIS — Z794 Long term (current) use of insulin: Secondary | ICD-10-CM | POA: Diagnosis not present

## 2017-09-27 DIAGNOSIS — E109 Type 1 diabetes mellitus without complications: Secondary | ICD-10-CM | POA: Diagnosis not present

## 2017-09-27 DIAGNOSIS — H5213 Myopia, bilateral: Secondary | ICD-10-CM | POA: Diagnosis not present

## 2017-09-27 DIAGNOSIS — E108 Type 1 diabetes mellitus with unspecified complications: Secondary | ICD-10-CM | POA: Diagnosis not present

## 2017-09-27 DIAGNOSIS — H52223 Regular astigmatism, bilateral: Secondary | ICD-10-CM | POA: Diagnosis not present

## 2017-10-02 ENCOUNTER — Ambulatory Visit: Payer: 59 | Admitting: Nurse Practitioner

## 2017-10-15 DIAGNOSIS — E108 Type 1 diabetes mellitus with unspecified complications: Secondary | ICD-10-CM | POA: Diagnosis not present

## 2017-10-15 DIAGNOSIS — E109 Type 1 diabetes mellitus without complications: Secondary | ICD-10-CM | POA: Diagnosis not present

## 2017-10-17 ENCOUNTER — Encounter: Payer: Self-pay | Admitting: Nurse Practitioner

## 2017-10-17 ENCOUNTER — Ambulatory Visit: Payer: 59 | Admitting: Nurse Practitioner

## 2017-10-17 VITALS — BP 120/73 | HR 78 | Temp 97.7°F | Ht 65.0 in | Wt 189.0 lb

## 2017-10-17 DIAGNOSIS — E785 Hyperlipidemia, unspecified: Secondary | ICD-10-CM | POA: Diagnosis not present

## 2017-10-17 DIAGNOSIS — Z6831 Body mass index (BMI) 31.0-31.9, adult: Secondary | ICD-10-CM | POA: Diagnosis not present

## 2017-10-17 DIAGNOSIS — Z8774 Personal history of (corrected) congenital malformations of heart and circulatory system: Secondary | ICD-10-CM

## 2017-10-17 DIAGNOSIS — E109 Type 1 diabetes mellitus without complications: Secondary | ICD-10-CM | POA: Diagnosis not present

## 2017-10-17 DIAGNOSIS — I119 Hypertensive heart disease without heart failure: Secondary | ICD-10-CM

## 2017-10-17 LAB — BAYER DCA HB A1C WAIVED: HB A1C (BAYER DCA - WAIVED): 6.2 % (ref <7.0)

## 2017-10-17 MED ORDER — LISINOPRIL-HYDROCHLOROTHIAZIDE 20-12.5 MG PO TABS: 1.0000 | ORAL_TABLET | Freq: Every day | ORAL | 1 refills | Status: DC

## 2017-10-17 MED ORDER — ROSUVASTATIN CALCIUM 10 MG PO TABS: 10.0000 mg | ORAL_TABLET | Freq: Every day | ORAL | 1 refills | Status: DC

## 2017-10-17 MED ORDER — HUMALOG 100 UNIT/ML ~~LOC~~ SOLN: SUBCUTANEOUS | 5 refills | Status: DC

## 2017-10-17 NOTE — Patient Instructions (Signed)

## 2017-10-17 NOTE — Progress Notes (Signed)
Subjective:    Patient ID: Maria Conley, female    DOB: 15-Dec-1965, 52 y.o.   MRN: [id number]  HPI  SHAMARI TROSTEL is here today for follow up of chronic medical problem.  Outpatient Encounter Medications as of 10/17/2017  Medication Sig  . aspirin EC 81 MG tablet Take 81 mg by mouth daily.  Marland Kitchen glucose blood (BAYER CONTOUR NEXT TEST) test strip USE AS DIRECTED 4 TO 5 TIMES DAILY AND AS NEEDED  . HUMALOG 100 UNIT/ML injection USE UP TO 50 UNITS ONCE DAILY IN INSULIN PUMP  . Insulin Infusion Pump Supplies (MINIMED INFUSION SET-MMT 399) MISC Use with insulin pump. Change infusion set q 3 days.  Marland Kitchen lisinopril-hydrochlorothiazide (PRINZIDE,ZESTORETIC) 20-12.5 MG tablet Take 1 tablet by mouth daily.  . rosuvastatin (CRESTOR) 10 MG tablet Take 1 tablet (10 mg total) by mouth daily.     1. Type 1 diabetes mellitus without complication ([place])  Last hgab1c was 6.5%. Her fasting blood sugars run around 130. She denies any hypoglycemia. Checks her blood sugars 3-4 x day.  2. Hyperlipidemia with target LDL less than 100  She says she tries real hard to watch her diet. She is  Seeing nutritionist monthly.  3. Benign hypertensive heart disease without heart failure  Not having any heart problems that she is aware of. Has not seen cardiology in awhile  4. BMI 31.0-31.9,adult  No recent weight changes  5. S/P atrial septal defect closure  Currently not having any problems. denies chest pain or sob    New complaints: None today  Social history: Is a nurse at [employer]    Review of Systems  Constitutional: Negative for activity change and appetite change.  HENT: Negative.   Eyes: Negative for pain.  Respiratory: Negative for shortness of breath.   Cardiovascular: Negative for chest pain, palpitations and leg swelling.  Gastrointestinal: Negative for abdominal pain.  Endocrine: Negative for polydipsia.  Genitourinary: Negative.   Skin: Negative for rash.  Neurological: Negative  for dizziness, weakness and headaches.  Hematological: Does not bruise/bleed easily.  Psychiatric/Behavioral: Negative.   All other systems reviewed and are negative.      Objective:   Physical Exam  Constitutional: She is oriented to person, place, and time. She appears well-developed and well-nourished.  HENT:  Nose: Nose normal.  Mouth/Throat: Oropharynx is clear and moist.  Eyes: EOM are normal.  Neck: Trachea normal, normal range of motion and full passive range of motion without pain. Neck supple. No JVD present. Carotid bruit is not present. No thyromegaly present.  Cardiovascular: Normal rate, regular rhythm, normal heart sounds and intact distal pulses. Exam reveals no gallop and no friction rub.  No murmur heard. Pulmonary/Chest: Effort normal and breath sounds normal.  Abdominal: Soft. Bowel sounds are normal. She exhibits no distension and no mass. There is no tenderness.  Musculoskeletal: Normal range of motion.  Lymphadenopathy:    She has no cervical adenopathy.  Neurological: She is alert and oriented to person, place, and time. She has normal reflexes.  Skin: Skin is warm and dry.  Psychiatric: She has a normal mood and affect. Her behavior is normal. Judgment and thought content normal.   BP 120/73   Pulse 78   Temp 97.7 F (36.5 C) (Oral)   Ht _0  (1.651 m)   Wt 189 lb (85.7 kg)   BMI 31.45 kg/m       Assessment & Plan:  1. Type 1 diabetes mellitus without complication (HCC) Continue  current diet and exrcise - Bayer DCA Hb A1c Waived - HUMALOG 100 UNIT/ML injection; USE UP TO 50 UNITS ONCE DAILY IN INSULIN PUMP  Dispense: 60 mL; Refill: 5  2. Hyperlipidemia with target LDL less than 100 Low fat diet - Lipid panel - rosuvastatin (CRESTOR) 10 MG tablet; Take 1 tablet (10 mg total) by mouth daily.  Dispense: 90 tablet; Refill: 1  3. Benign hypertensive heart disease without heart failure Low sodium diet - CMP14+EGFR - lisinopril-hydrochlorothiazide  (PRINZIDE,ZESTORETIC) 20-12.5 MG tablet; Take 1 tablet by mouth daily.  Dispense: 90 tablet; Refill: 1  4. BMI 31.0-31.9,adult Discussed diet and exercise for person with BMI >25 Will recheck weight in 3-6 months  5. S/P atrial septal defect closure     Labs pending Health maintenance reviewed Diet and exercise encouraged Continue all meds Follow up  In 3 month   [place], FNP

## 2017-10-18 LAB — CMP14+EGFR
ALT: 14 IU/L (ref 0–32)
AST: 12 IU/L (ref 0–40)
Albumin/Globulin Ratio: 2.1 (ref 1.2–2.2)
Albumin: 4.1 g/dL (ref 3.5–5.5)
Alkaline Phosphatase: 55 IU/L (ref 39–117)
BUN / CREAT RATIO: 14 (ref 9–23)
BUN: 10 mg/dL (ref 6–24)
Bilirubin Total: 0.5 mg/dL (ref 0.0–1.2)
CO2: 24 mmol/L (ref 20–29)
CREATININE: 0.73 mg/dL (ref 0.57–1.00)
Calcium: 8.9 mg/dL (ref 8.7–10.2)
Chloride: 101 mmol/L (ref 96–106)
GFR calc Af Amer: 109 mL/min/{1.73_m2} (ref >59)
GFR calc non Af Amer: 94 mL/min/{1.73_m2} (ref >59)
GLOBULIN, TOTAL: 2 g/dL (ref 1.5–4.5)
Glucose: 147 mg/dL — ABNORMAL HIGH (ref 65–99)
Potassium: 3.8 mmol/L (ref 3.5–5.2)
SODIUM: 139 mmol/L (ref 134–144)
Total Protein: 6.1 g/dL (ref 6.0–8.5)

## 2017-10-18 LAB — LIPID PANEL
Chol/HDL Ratio: 2.3 ratio (ref 0.0–4.4)
Cholesterol, Total: 145 mg/dL (ref 100–199)
HDL: 63 mg/dL (ref >39)
LDL CALC: 71 mg/dL (ref 0–99)
TRIGLYCERIDES: 53 mg/dL (ref 0–149)
VLDL CHOLESTEROL CAL: 11 mg/dL (ref 5–40)

## 2017-10-22 ENCOUNTER — Other Ambulatory Visit: Payer: Self-pay | Admitting: Nurse Practitioner

## 2017-10-22 DIAGNOSIS — E109 Type 1 diabetes mellitus without complications: Secondary | ICD-10-CM

## 2017-10-22 MED FILL — LISINOPRIL-HCTZ 20-12.5 MG: 20-12.5 | 90 days supply | Qty: 90 | Fill #0

## 2017-10-22 MED FILL — ROSUVASTATIN CALCIUM 10 MG: 10 | 90 days supply | Qty: 90 | Fill #0

## 2017-11-04 MED FILL — CONTOUR NEXT STRIPS: 90 days supply | Qty: 450 | Fill #0

## 2018-01-01 MED FILL — HumaLOG 100 UNIT/ML SOLN: 100 | 80 days supply | Qty: 40 | Fill #0

## 2018-01-06 DIAGNOSIS — E108 Type 1 diabetes mellitus with unspecified complications: Secondary | ICD-10-CM | POA: Diagnosis not present

## 2018-01-06 DIAGNOSIS — E109 Type 1 diabetes mellitus without complications: Secondary | ICD-10-CM | POA: Diagnosis not present

## 2018-01-16 ENCOUNTER — Ambulatory Visit: Payer: 59 | Admitting: Nurse Practitioner

## 2018-01-16 ENCOUNTER — Encounter: Payer: Self-pay | Admitting: Nurse Practitioner

## 2018-01-16 VITALS — BP 108/71 | HR 68 | Temp 98.2°F | Ht 65.0 in | Wt 191.0 lb

## 2018-01-16 DIAGNOSIS — Z1212 Encounter for screening for malignant neoplasm of rectum: Secondary | ICD-10-CM | POA: Diagnosis not present

## 2018-01-16 DIAGNOSIS — E785 Hyperlipidemia, unspecified: Secondary | ICD-10-CM | POA: Diagnosis not present

## 2018-01-16 DIAGNOSIS — I119 Hypertensive heart disease without heart failure: Secondary | ICD-10-CM

## 2018-01-16 DIAGNOSIS — E109 Type 1 diabetes mellitus without complications: Secondary | ICD-10-CM | POA: Diagnosis not present

## 2018-01-16 DIAGNOSIS — Z1211 Encounter for screening for malignant neoplasm of colon: Secondary | ICD-10-CM | POA: Diagnosis not present

## 2018-01-16 DIAGNOSIS — Z6831 Body mass index (BMI) 31.0-31.9, adult: Secondary | ICD-10-CM

## 2018-01-16 LAB — BAYER DCA HB A1C WAIVED: HB A1C: 6.5 % (ref <7.0)

## 2018-01-16 MED ORDER — LISINOPRIL-HYDROCHLOROTHIAZIDE 20-12.5 MG PO TABS: 1.0000 | ORAL_TABLET | Freq: Every day | ORAL | 1 refills | Status: DC

## 2018-01-16 MED ORDER — ROSUVASTATIN CALCIUM 10 MG PO TABS: 10.0000 mg | ORAL_TABLET | Freq: Every day | ORAL | 1 refills | Status: DC

## 2018-01-16 MED ORDER — HUMALOG 100 UNIT/ML ~~LOC~~ SOLN: SUBCUTANEOUS | 5 refills | Status: DC

## 2018-01-16 NOTE — Patient Instructions (Signed)

## 2018-01-16 NOTE — Progress Notes (Addendum)
Subjective:    Patient ID: Maria Conley, female    DOB: 10-03-1965, 52 y.o.   MRN: [id number]   Chief Complaint: Medical Management of Chronic isues  HPI:  1. Type 1 diabetes mellitus without complication (HCC)  Last hgba1c was 6.2 today. Her fasting blood sugars are running around 60-70 fasting in mornings  2. Hyperlipidemia with target LDL less than 100  Watches diet very closely.  3. Benign hypertensive heart disease without heart failure  No c/o chest pain, sob or headache. Checks blood pressure at work on occasional and is always within normal limits.  4. BMI 31.0-31.9,adult  No recent weight chnages.    Outpatient Encounter Medications as of 01/16/2018  Medication Sig  . aspirin EC 81 MG tablet Take 81 mg by mouth daily.  . CONTOUR NEXT TEST test strip USE AS DIRECTED 4 TO 5 TIMES DAILY AND AS NEEDED  . HUMALOG 100 UNIT/ML injection USE UP TO 50 UNITS ONCE DAILY IN INSULIN PUMP  . Insulin Infusion Pump Supplies (MINIMED INFUSION SET-MMT 399) MISC Use with insulin pump. Change infusion set q 3 days.  Marland Kitchen lisinopril-hydrochlorothiazide (PRINZIDE,ZESTORETIC) 20-12.5 MG tablet Take 1 tablet by mouth daily.  . rosuvastatin (CRESTOR) 10 MG tablet Take 1 tablet (10 mg total) by mouth daily.      New complaints: none  Social history: Works in Chartered certified accountant at [employer].   Review of Systems  Constitutional: Negative for activity change and appetite change.  HENT: Negative.   Eyes: Negative for pain.  Respiratory: Negative for shortness of breath.   Cardiovascular: Negative for chest pain, palpitations and leg swelling.  Gastrointestinal: Negative for abdominal pain.  Endocrine: Negative for polydipsia.  Genitourinary: Negative.   Skin: Negative for rash.  Neurological: Negative for dizziness, weakness and headaches.  Hematological: Does not bruise/bleed easily.  Psychiatric/Behavioral: Negative.   All other systems reviewed and are negative.      Objective:   Physical Exam  Constitutional: She is oriented to person, place, and time.  HENT:  Head: Normocephalic.  Nose: Nose normal.  Mouth/Throat: Oropharynx is clear and moist.  Eyes: Pupils are equal, round, and reactive to light. EOM are normal.  Neck: Normal range of motion. Neck supple. No JVD present. Carotid bruit is not present.  Cardiovascular: Normal rate, regular rhythm, normal heart sounds and intact distal pulses.  Pulmonary/Chest: Effort normal and breath sounds normal. No respiratory distress. She has no wheezes. She has no rales. She exhibits no tenderness.  Abdominal: Soft. Normal appearance, normal aorta and bowel sounds are normal. She exhibits no distension, no abdominal bruit, no pulsatile midline mass and no mass. There is no splenomegaly or hepatomegaly. There is no tenderness.  Musculoskeletal: Normal range of motion. She exhibits no edema.  Lymphadenopathy:    She has no cervical adenopathy.  Neurological: She is alert and oriented to person, place, and time. She has normal reflexes.  Skin: Skin is warm and dry.  Psychiatric: Judgment normal.   BP 108/71   Pulse 68   Temp 98.2 F (36.8 C) (Oral)   Ht '5\' 5"'  (1.651 m)   Wt 191 lb (86.6 kg)   BMI 31.78 kg/m      Assessment & Plan:  Maria Conley comes in today with chief complaint of Medical Management of Chronic Issues   Diagnosis and orders addressed:  1. Type 1 diabetes mellitus without complication (HCC) Continue to watch carbs in diet - Bayer DCA Hb A1c Waived - HUMALOG 100 UNIT/ML  injection; USE UP TO 50 UNITS ONCE DAILY IN INSULIN PUMP  Dispense: 60 mL; Refill: 5  2. Hyperlipidemia with target LDL less than 100 Low fat diet - Lipid panel - rosuvastatin (CRESTOR) 10 MG tablet; Take 1 tablet (10 mg total) by mouth daily.  Dispense: 90 tablet; Refill: 1  3. Benign hypertensive heart disease without heart failure Low sodium diet - CMP14+EGFR - lisinopril-hydrochlorothiazide (PRINZIDE,ZESTORETIC)  20-12.5 MG tablet; Take 1 tablet by mouth daily.  Dispense: 90 tablet; Refill: 1  4. BMI 31.0-31.9,adult Discussed diet and exercise for person with BMI >25 Will recheck weight in 3-6 months   Labs pending Health Maintenance reviewed Diet and exercise encouraged  Follow up plan: 3 months   Mary-Margaret Hassell Done, FNP

## 2018-01-17 LAB — CMP14+EGFR
A/G RATIO: 2 (ref 1.2–2.2)
ALK PHOS: 60 IU/L (ref 39–117)
ALT: 10 IU/L (ref 0–32)
AST: 15 IU/L (ref 0–40)
Albumin: 4.1 g/dL (ref 3.5–5.5)
BILIRUBIN TOTAL: 0.5 mg/dL (ref 0.0–1.2)
BUN/Creatinine Ratio: 10 (ref 9–23)
BUN: 8 mg/dL (ref 6–24)
CHLORIDE: 101 mmol/L (ref 96–106)
CO2: 24 mmol/L (ref 20–29)
Calcium: 9 mg/dL (ref 8.7–10.2)
Creatinine, Ser: 0.84 mg/dL (ref 0.57–1.00)
GFR calc Af Amer: 92 mL/min/{1.73_m2} (ref >59)
GFR calc non Af Amer: 80 mL/min/{1.73_m2} (ref >59)
GLUCOSE: 216 mg/dL — AB (ref 65–99)
Globulin, Total: 2.1 g/dL (ref 1.5–4.5)
POTASSIUM: 4.2 mmol/L (ref 3.5–5.2)
Sodium: 138 mmol/L (ref 134–144)
Total Protein: 6.2 g/dL (ref 6.0–8.5)

## 2018-01-17 LAB — LIPID PANEL
CHOLESTEROL TOTAL: 151 mg/dL (ref 100–199)
Chol/HDL Ratio: 2.6 ratio (ref 0.0–4.4)
HDL: 59 mg/dL (ref >39)
LDL Calculated: 77 mg/dL (ref 0–99)
TRIGLYCERIDES: 74 mg/dL (ref 0–149)
VLDL CHOLESTEROL CAL: 15 mg/dL (ref 5–40)

## 2018-01-24 DIAGNOSIS — E108 Type 1 diabetes mellitus with unspecified complications: Secondary | ICD-10-CM | POA: Diagnosis not present

## 2018-01-24 DIAGNOSIS — E109 Type 1 diabetes mellitus without complications: Secondary | ICD-10-CM | POA: Diagnosis not present

## 2018-02-24 DIAGNOSIS — E109 Type 1 diabetes mellitus without complications: Secondary | ICD-10-CM | POA: Diagnosis not present

## 2018-02-24 DIAGNOSIS — E108 Type 1 diabetes mellitus with unspecified complications: Secondary | ICD-10-CM | POA: Diagnosis not present

## 2018-02-26 MED FILL — ROSUVASTATIN CALCIUM 10 MG: 10 | 90 days supply | Qty: 90 | Fill #1

## 2018-02-26 MED FILL — LISINOPRIL-HCTZ 20-12.5 TAB: 20-12.5 | 90 days supply | Qty: 90 | Fill #1

## 2018-03-18 ENCOUNTER — Encounter: Payer: Self-pay | Admitting: *Deleted

## 2018-03-31 ENCOUNTER — Encounter: Payer: Self-pay | Admitting: Pediatrics

## 2018-03-31 ENCOUNTER — Ambulatory Visit: Payer: 59 | Admitting: Pediatrics

## 2018-03-31 VITALS — BP 132/72 | HR 97 | Temp 97.9°F | Ht 65.0 in | Wt 189.4 lb

## 2018-03-31 DIAGNOSIS — M62830 Muscle spasm of back: Secondary | ICD-10-CM

## 2018-03-31 MED ORDER — PREDNISONE 10 MG PO TABS: ORAL_TABLET | ORAL | 0 refills | Status: DC

## 2018-03-31 NOTE — Progress Notes (Signed)
  Subjective:   Patient ID: Maria Conley, female    DOB: 10/23/1965, 52 y.o.   MRN: [id number] CC: Back Pain  HPI: Maria Conley is a 52 y.o. female   Patient peeled 5 pounds of potatoes yesterday for family event.  She was not able to sleep last night because of spasms in her mid right back.  This is happened before.  The only thing that helped was prednisone.  She has been taking ibuprofen and Tylenol regularly, using heating pad and ice with minimal relief.  She is not able to sit comfortably.  She is not able to move her right arm because the pain causes in the muscles in her back.  No other injury.  She has type 1 diabetes.    Relevant past medical, surgical, family and social history reviewed. Allergies and medications reviewed and updated. Social History   Tobacco Use  Smoking Status Former Smoker  . Packs/day: 0.30  . Years: 10.00  . Pack years: 3.00  . Types: Cigarettes  . Last attempt to quit: 01/01/2013  . Years since quitting: 5.2  Smokeless Tobacco Never Used   ROS: Per HPI   Objective:    BP 132/72   Pulse 97   Temp 97.9 F (36.6 C) (Oral)   Ht 5\' 5"  (1.651 m)   Wt 189 lb 6.4 oz (85.9 kg)   BMI 31.52 kg/m   Wt Readings from Last 3 Encounters:  03/31/18 189 lb 6.4 oz (85.9 kg)  01/16/18 191 lb (86.6 kg)  10/17/17 189 lb (85.7 kg)    Gen: NAD, alert, cooperative with exam, NCAT EYES: EOMI, no conjunctival injection, or no icterus ENT:  TMs pearly gray b/l, OP without erythema LYMPH: no cervical LAD CV: NRRR, normal S1/S2, no murmur, distal pulses 2+ b/l Resp: CTABL, no wheezes, normal WOB Abd: +BS, soft, NTND. no guarding or organomegaly Ext: No edema, warm Neuro: Alert and oriented, strength equal b/l UE and LE, coordination grossly normal MSK: normal muscle bulk  Assessment & Plan:  [name] was seen today for back pain.  Diagnoses and all orders for this visit:  Back spasm We will do short taper of prednisone, patient says the only thing  that has helped with this in the past. Will need to increase sliding scale insulin fall below.  Check sugars more frequently than usual.  Discussed gentle back exercises, stretches, avoiding exacerbating circumstances in future, continue NSAIDs Tylenol ice and heat. -     predniSONE (DELTASONE) 10 MG tablet; Take 30mg  for 2 days, then 20mg  for 2 days then 10 mg for 2 days.   Follow up plan: Return if symptoms worsen or fail to improve. Rex Krasarol Cephus Tupy, MD [facility]

## 2018-04-03 MED FILL — HumaLOG 100 UNIT/ML SOLN: 100 | 80 days supply | Qty: 40 | Fill #1

## 2018-04-15 DIAGNOSIS — E109 Type 1 diabetes mellitus without complications: Secondary | ICD-10-CM | POA: Diagnosis not present

## 2018-04-15 DIAGNOSIS — E108 Type 1 diabetes mellitus with unspecified complications: Secondary | ICD-10-CM | POA: Diagnosis not present

## 2018-04-23 DIAGNOSIS — E109 Type 1 diabetes mellitus without complications: Secondary | ICD-10-CM | POA: Diagnosis not present

## 2018-04-23 DIAGNOSIS — E108 Type 1 diabetes mellitus with unspecified complications: Secondary | ICD-10-CM | POA: Diagnosis not present

## 2018-05-02 ENCOUNTER — Ambulatory Visit: Payer: 59 | Admitting: Nurse Practitioner

## 2018-05-14 ENCOUNTER — Telehealth: Payer: Self-pay | Admitting: Nurse Practitioner

## 2018-05-14 NOTE — Telephone Encounter (Signed)
Appt made

## 2018-05-14 NOTE — Telephone Encounter (Signed)
Just schedule her

## 2018-05-22 ENCOUNTER — Ambulatory Visit: Payer: 59 | Admitting: Nurse Practitioner

## 2018-05-22 ENCOUNTER — Encounter: Payer: Self-pay | Admitting: Nurse Practitioner

## 2018-05-22 VITALS — BP 125/75 | HR 84 | Temp 97.0°F | Ht 65.0 in | Wt 189.0 lb

## 2018-05-22 DIAGNOSIS — I119 Hypertensive heart disease without heart failure: Secondary | ICD-10-CM | POA: Diagnosis not present

## 2018-05-22 DIAGNOSIS — Z6831 Body mass index (BMI) 31.0-31.9, adult: Secondary | ICD-10-CM | POA: Diagnosis not present

## 2018-05-22 DIAGNOSIS — E109 Type 1 diabetes mellitus without complications: Secondary | ICD-10-CM

## 2018-05-22 DIAGNOSIS — E785 Hyperlipidemia, unspecified: Secondary | ICD-10-CM

## 2018-05-22 LAB — BAYER DCA HB A1C WAIVED: HB A1C: 6.6 % (ref <7.0)

## 2018-05-22 MED ORDER — LISINOPRIL-HYDROCHLOROTHIAZIDE 20-12.5 MG PO TABS: 1.0000 | ORAL_TABLET | Freq: Every day | ORAL | 1 refills | Status: DC

## 2018-05-22 MED ORDER — ROSUVASTATIN CALCIUM 10 MG PO TABS: 10.0000 mg | ORAL_TABLET | Freq: Every day | ORAL | 1 refills | Status: DC

## 2018-05-22 MED ORDER — HUMALOG 100 UNIT/ML ~~LOC~~ SOLN: SUBCUTANEOUS | 5 refills | Status: DC

## 2018-05-22 MED FILL — ROSUVASTATIN CALCIUM 10 MG: 10 | 90 days supply | Qty: 90 | Fill #0

## 2018-05-22 MED FILL — LISINOPRIL-HCTZ 20-12.5 TAB: 20-12.5 | 90 days supply | Qty: 90 | Fill #0

## 2018-05-22 NOTE — Progress Notes (Signed)
 Subjective:    Patient ID: Maria Conley, female    DOB: 06/06/1966, 51 y.o.   MRN: [id number]   Chief Complaint: Medical Management of chronic issues  HPI:  1. Type 1 diabetes mellitus without complication (HCC)  Last hgab1c was 6.5. She is on insulin pump. She checks blood sugars 3-4 times a day. She watches diet.  2. Hyperlipidemia with target LDL less than 100  Watches diet closely  3. BMI 31.0-31.9,adult  No recent weight changes  4. Benign hypertensive heart disease without heart failure  No c/o chest pain, sob, or headache. Checks blood pressure on occasionat work- systoloic always below 140    Outpatient Encounter Medications as of 05/22/2018  Medication Sig  . aspirin EC 81 MG tablet Take 81 mg by mouth daily.  . CONTOUR NEXT TEST test strip USE AS DIRECTED 4 TO 5 TIMES DAILY AND AS NEEDED  . HUMALOG 100 UNIT/ML injection USE UP TO 50 UNITS ONCE DAILY IN INSULIN PUMP  . Insulin Infusion Pump Supplies (MINIMED INFUSION SET-MMT 399) MISC Use with insulin pump. Change infusion set q 3 days.  . lisinopril-hydrochlorothiazide (PRINZIDE,ZESTORETIC) 20-12.5 MG tablet Take 1 tablet by mouth daily.  . predniSONE (DELTASONE) 10 MG tablet Take 30mg for 2 days, then 20mg for 2 days then 10 mg for 2 days.  . rosuvastatin (CRESTOR) 10 MG tablet Take 1 tablet (10 mg total) by mouth daily.      New complaints: None  today  Social history: Nurse at [employer].   Review of Systems  Constitutional: Negative for activity change and appetite change.  HENT: Negative.   Eyes: Negative for pain.  Respiratory: Negative for shortness of breath.   Cardiovascular: Negative for chest pain, palpitations and leg swelling.  Gastrointestinal: Negative for abdominal pain.  Endocrine: Negative for polydipsia.  Genitourinary: Negative.   Skin: Negative for rash.  Neurological: Negative for dizziness, weakness and headaches.  Hematological: Does not bruise/bleed easily.    Psychiatric/Behavioral: Negative.   All other systems reviewed and are negative.      Objective:   Physical Exam  Constitutional: She is oriented to person, place, and time. She appears well-developed and well-nourished. No distress.  HENT:  Head: Normocephalic.  Nose: Nose normal.  Mouth/Throat: Oropharynx is clear and moist.  Eyes: Pupils are equal, round, and reactive to light. EOM are normal.  Neck: Normal range of motion. Neck supple. No JVD present. Carotid bruit is not present.  Cardiovascular: Normal rate, regular rhythm, normal heart sounds and intact distal pulses.  Pulmonary/Chest: Effort normal and breath sounds normal. No respiratory distress. She has no wheezes. She has no rales. She exhibits no tenderness.  Abdominal: Soft. Normal appearance, normal aorta and bowel sounds are normal. She exhibits no distension, no abdominal bruit, no pulsatile midline mass and no mass. There is no splenomegaly or hepatomegaly. There is no tenderness.  Musculoskeletal: Normal range of motion. She exhibits no edema.  Lymphadenopathy:    She has no cervical adenopathy.  Neurological: She is alert and oriented to person, place, and time. She has normal reflexes.  Skin: Skin is warm and dry.  Psychiatric: She has a normal mood and affect. Her behavior is normal. Judgment and thought content normal.  Nursing note and vitals reviewed.   BP 125/75   Pulse 84   Temp (!) 97 F (36.1 C) (Oral)   Ht 5' 5" (1.651 m)   Wt 189 lb (85.7 kg)   BMI 31.45 kg/m          Assessment & Plan:  Ziyon W Benjamin comes in today with chief complaint of Medical Management of Chronic Issues   Diagnosis and orders addressed:  1. Type 1 diabetes mellitus without complication (HCC) Continue  To watch carbs in diet - Bayer DCA Hb A1c Waived - CMP14+EGFR - HUMALOG 100 UNIT/ML injection; USE UP TO 50 UNITS ONCE DAILY IN INSULIN PUMP  Dispense: 60 mL; Refill: 5  2. Hyperlipidemia with target LDL less  than 100 Low fat diet - Lipid panel - rosuvastatin (CRESTOR) 10 MG tablet; Take 1 tablet (10 mg total) by mouth daily.  Dispense: 90 tablet; Refill: 1  3. BMI 31.0-31.9,adult Discussed diet and exercise for person with BMI >25 Will recheck weight in 3-6 months  4. Benign hypertensive heart disease without heart failure Low sodium diet - lisinopril-hydrochlorothiazide (PRINZIDE,ZESTORETIC) 20-12.5 MG tablet; Take 1 tablet by mouth daily.  Dispense: 90 tablet; Refill: 1   Labs pending Health Maintenance reviewed Diet and exercise encouraged  Follow up plan: 3 months   Mary-Margaret Martin, FNP  

## 2018-05-22 NOTE — Patient Instructions (Signed)

## 2018-05-23 LAB — CMP14+EGFR
ALBUMIN: 4.1 g/dL (ref 3.5–5.5)
ALT: 10 IU/L (ref 0–32)
AST: 12 IU/L (ref 0–40)
Albumin/Globulin Ratio: 2 (ref 1.2–2.2)
Alkaline Phosphatase: 66 IU/L (ref 39–117)
BUN / CREAT RATIO: 16 (ref 9–23)
BUN: 11 mg/dL (ref 6–24)
Bilirubin Total: 0.5 mg/dL (ref 0.0–1.2)
CALCIUM: 9 mg/dL (ref 8.7–10.2)
CO2: 25 mmol/L (ref 20–29)
CREATININE: 0.67 mg/dL (ref 0.57–1.00)
Chloride: 98 mmol/L (ref 96–106)
GFR, EST AFRICAN AMERICAN: 116 mL/min/{1.73_m2} (ref >59)
GFR, EST NON AFRICAN AMERICAN: 101 mL/min/{1.73_m2} (ref >59)
GLOBULIN, TOTAL: 2.1 g/dL (ref 1.5–4.5)
GLUCOSE: 290 mg/dL — AB (ref 65–99)
Potassium: 4 mmol/L (ref 3.5–5.2)
SODIUM: 139 mmol/L (ref 134–144)
TOTAL PROTEIN: 6.2 g/dL (ref 6.0–8.5)

## 2018-05-23 LAB — LIPID PANEL
CHOL/HDL RATIO: 2.6 ratio (ref 0.0–4.4)
Cholesterol, Total: 145 mg/dL (ref 100–199)
HDL: 55 mg/dL (ref >39)
LDL CALC: 70 mg/dL (ref 0–99)
Triglycerides: 100 mg/dL (ref 0–149)
VLDL CHOLESTEROL CAL: 20 mg/dL (ref 5–40)

## 2018-05-30 ENCOUNTER — Encounter: Payer: Self-pay | Admitting: *Deleted

## 2018-06-04 DIAGNOSIS — Z029 Encounter for administrative examinations, unspecified: Secondary | ICD-10-CM

## 2018-06-11 MED FILL — ROSUVASTATIN CALCIUM 10 MG: 10 | 90 days supply | Qty: 90 | Fill #0

## 2018-06-11 MED FILL — LISINOPRIL-HCTZ 20-12.5 TAB: 20-12.5 | 90 days supply | Qty: 90 | Fill #0

## 2018-06-24 MED FILL — HumaLOG 100 UNIT/ML SOLN: 100 | 80 days supply | Qty: 40 | Fill #2

## 2018-08-25 ENCOUNTER — Ambulatory Visit: Payer: 59 | Admitting: Nurse Practitioner

## 2018-08-25 ENCOUNTER — Encounter: Payer: Self-pay | Admitting: Nurse Practitioner

## 2018-08-25 VITALS — BP 114/68 | HR 80 | Temp 98.0°F | Ht 65.0 in | Wt 190.0 lb

## 2018-08-25 DIAGNOSIS — Z6831 Body mass index (BMI) 31.0-31.9, adult: Secondary | ICD-10-CM

## 2018-08-25 DIAGNOSIS — E109 Type 1 diabetes mellitus without complications: Secondary | ICD-10-CM | POA: Diagnosis not present

## 2018-08-25 DIAGNOSIS — E785 Hyperlipidemia, unspecified: Secondary | ICD-10-CM | POA: Diagnosis not present

## 2018-08-25 DIAGNOSIS — I119 Hypertensive heart disease without heart failure: Secondary | ICD-10-CM | POA: Diagnosis not present

## 2018-08-25 LAB — BAYER DCA HB A1C WAIVED: HB A1C (BAYER DCA - WAIVED): 7 % — ABNORMAL HIGH (ref <7.0)

## 2018-08-25 MED ORDER — LISINOPRIL-HYDROCHLOROTHIAZIDE 20-12.5 MG PO TABS: 1.0000 | ORAL_TABLET | Freq: Every day | ORAL | 1 refills | Status: DC

## 2018-08-25 MED ORDER — ROSUVASTATIN CALCIUM 10 MG PO TABS: 10.0000 mg | ORAL_TABLET | Freq: Every day | ORAL | 1 refills | Status: DC

## 2018-08-25 MED ORDER — HUMALOG 100 UNIT/ML ~~LOC~~ SOLN: SUBCUTANEOUS | 5 refills | Status: DC

## 2018-08-25 MED FILL — ROSUVASTATIN CALCIUM 10 MG: 10 | 90 days supply | Qty: 90 | Fill #0

## 2018-08-25 MED FILL — LISINOPRIL-HCTZ 20-12.5 MG: 20-12.5 | 90 days supply | Qty: 90 | Fill #0

## 2018-08-25 NOTE — Progress Notes (Signed)
Subjective:    Patient ID: Maria Conley, female    DOB: December 07, 1965, 52 y.o.   MRN: [id number]  /  Chief Complaint: medical management of chronic issues  HPI:  1. Benign hypertensive heart disease without heart failure  No c/o chest pain, sob or headache. She checks her blood pressure at work on occasion, and remains below 185'U systolic.  2. Type 1 diabetes mellitus without complication ([place])  Last hgba1c was 6.6. she has an insulin pump. She watches diet and checks blood sugars several times a day,  3. Hyperlipidemia with target LDL less than 100  She does watch her diet bit does not do any dedicated exercise  4. BMI 31.0-31.9,adult  No recent weight changes    Outpatient Encounter Medications as of 08/25/2018  Medication Sig  . aspirin EC 81 MG tablet Take 81 mg by mouth daily.  . CONTOUR NEXT TEST test strip USE AS DIRECTED 4 TO 5 TIMES DAILY AND AS NEEDED  . HUMALOG 100 UNIT/ML injection USE UP TO 50 UNITS ONCE DAILY IN INSULIN PUMP  . Insulin Infusion Pump Supplies (MINIMED INFUSION SET-MMT 399) MISC Use with insulin pump. Change infusion set q 3 days.  Marland Kitchen lisinopril-hydrochlorothiazide (PRINZIDE,ZESTORETIC) 20-12.5 MG tablet Take 1 tablet by mouth daily.  . rosuvastatin (CRESTOR) 10 MG tablet Take 1 tablet (10 mg total) by mouth daily.      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  HENT: Negative.   Eyes: Negative for pain.  Respiratory: Negative for shortness of breath.   Cardiovascular: Negative for chest pain, palpitations and leg swelling.  Gastrointestinal: Negative for abdominal pain.  Endocrine: Negative for polydipsia.  Genitourinary: Negative.   Skin: Negative for rash.  Neurological: Negative for dizziness, weakness and headaches.  Hematological: Does not bruise/bleed easily.  Psychiatric/Behavioral: Negative.   All  other systems reviewed and are negative.      Objective:   Physical Exam Vitals signs and nursing note reviewed.  Constitutional:      General: She is not in acute distress.    Appearance: Normal appearance. She is well-developed.  HENT:     Head: Normocephalic.     Nose: Nose normal.  Eyes:     Pupils: Pupils are equal, round, and reactive to light.  Neck:     Musculoskeletal: Normal range of motion and neck supple.     Vascular: No carotid bruit or JVD.  Cardiovascular:     Rate and Rhythm: Normal rate and regular rhythm.     Heart sounds: Normal heart sounds.  Pulmonary:     Effort: Pulmonary effort is normal. No respiratory distress.     Breath sounds: Normal breath sounds. No wheezing or rales.  Chest:     Chest wall: No tenderness.  Abdominal:     General: Bowel sounds are normal. There is no distension or abdominal bruit.     Palpations: Abdomen is soft. There is no hepatomegaly, splenomegaly, mass or pulsatile mass.     Tenderness: There is no abdominal tenderness.  Musculoskeletal: Normal range of motion.  Lymphadenopathy:     Cervical: No cervical adenopathy.  Skin:    General: Skin is warm and dry.  Neurological:     Mental Status: She is alert and oriented to person, place, and time.     Deep Tendon Reflexes: Reflexes are normal and symmetric.  Psychiatric:  Behavior: Behavior normal.        Thought Content: Thought content normal.        Judgment: Judgment normal.     BP 114/68   Pulse 80   Temp 98 F (36.7 C) (Oral)   Ht '5\' 5"'  (1.651 m)   Wt 190 lb (86.2 kg)   BMI 31.62 kg/m   hgba1c 7.0     Assessment & Plan:  SHAMECKA HOCUTT comes in today with chief complaint of Medical Management of Chronic Issues   Diagnosis and orders addressed:  1. Benign hypertensive heart disease without heart failure Low sodium diet - lisinopril-hydrochlorothiazide (PRINZIDE,ZESTORETIC) 20-12.5 MG tablet; Take 1 tablet by mouth daily.  Dispense: 90  tablet; Refill: 1  2. Type 1 diabetes mellitus without complication (HCC) Continue to watch carbsin diet Check blood sugar 4x a day - Bayer DCA Hb A1c Waived - Microalbumin / creatinine urine ratio - HUMALOG 100 UNIT/ML injection; USE UP TO 50 UNITS ONCE DAILY IN INSULIN PUMP  Dispense: 60 mL; Refill: 5  3. Hyperlipidemia with target LDL less than 100 Low fat diet - CMP14+EGFR - Lipid panel - rosuvastatin (CRESTOR) 10 MG tablet; Take 1 tablet (10 mg total) by mouth daily.  Dispense: 90 tablet; Refill: 1  4. BMI 31.0-31.9,adult Discussed diet and exercise for person with BMI >25 Will recheck weight in 3-6 months   Labs pending Health Maintenance reviewed Diet and exercise encouraged  Follow up plan: 3 months   Mary-Margaret Hassell Done, FNP

## 2018-08-26 LAB — CMP14+EGFR
A/G RATIO: 2.3 — AB (ref 1.2–2.2)
ALT: 15 IU/L (ref 0–32)
AST: 18 IU/L (ref 0–40)
Albumin: 4.3 g/dL (ref 3.5–5.5)
Alkaline Phosphatase: 67 IU/L (ref 39–117)
BUN/Creatinine Ratio: 8 — ABNORMAL LOW (ref 9–23)
BUN: 6 mg/dL (ref 6–24)
Bilirubin Total: 0.3 mg/dL (ref 0.0–1.2)
CALCIUM: 9.2 mg/dL (ref 8.7–10.2)
CHLORIDE: 102 mmol/L (ref 96–106)
CO2: 24 mmol/L (ref 20–29)
Creatinine, Ser: 0.8 mg/dL (ref 0.57–1.00)
GFR calc Af Amer: 97 mL/min/{1.73_m2} (ref >59)
GFR, EST NON AFRICAN AMERICAN: 84 mL/min/{1.73_m2} (ref >59)
GLOBULIN, TOTAL: 1.9 g/dL (ref 1.5–4.5)
Glucose: 83 mg/dL (ref 65–99)
POTASSIUM: 3.9 mmol/L (ref 3.5–5.2)
SODIUM: 141 mmol/L (ref 134–144)
Total Protein: 6.2 g/dL (ref 6.0–8.5)

## 2018-08-26 LAB — LIPID PANEL
CHOL/HDL RATIO: 3 ratio (ref 0.0–4.4)
Cholesterol, Total: 172 mg/dL (ref 100–199)
HDL: 57 mg/dL (ref >39)
LDL Calculated: 97 mg/dL (ref 0–99)
TRIGLYCERIDES: 91 mg/dL (ref 0–149)
VLDL Cholesterol Cal: 18 mg/dL (ref 5–40)

## 2018-08-26 LAB — MICROALBUMIN / CREATININE URINE RATIO
CREATININE, UR: 56.3 mg/dL
MICROALB/CREAT RATIO: 79.6 mg/g{creat} — AB (ref 0.0–30.0)
Microalbumin, Urine: 44.8 ug/mL

## 2018-09-08 MED FILL — HumaLOG 100 UNIT/ML SOLN: 100 | 80 days supply | Qty: 40 | Fill #0

## 2018-09-15 DIAGNOSIS — E162 Hypoglycemia, unspecified: Secondary | ICD-10-CM | POA: Diagnosis not present

## 2018-09-15 DIAGNOSIS — R404 Transient alteration of awareness: Secondary | ICD-10-CM | POA: Diagnosis not present

## 2018-09-15 DIAGNOSIS — E161 Other hypoglycemia: Secondary | ICD-10-CM | POA: Diagnosis not present

## 2018-09-15 DIAGNOSIS — R61 Generalized hyperhidrosis: Secondary | ICD-10-CM | POA: Diagnosis not present

## 2018-09-15 DIAGNOSIS — R4020 Unspecified coma: Secondary | ICD-10-CM | POA: Diagnosis not present

## 2018-09-30 DIAGNOSIS — Z1389 Encounter for screening for other disorder: Secondary | ICD-10-CM | POA: Diagnosis not present

## 2018-09-30 DIAGNOSIS — Z01419 Encounter for gynecological examination (general) (routine) without abnormal findings: Secondary | ICD-10-CM | POA: Diagnosis not present

## 2018-09-30 DIAGNOSIS — Z130 Encounter for screening for diseases of the blood and blood-forming organs and certain disorders involving the immune mechanism: Secondary | ICD-10-CM | POA: Diagnosis not present

## 2018-09-30 DIAGNOSIS — Z6831 Body mass index (BMI) 31.0-31.9, adult: Secondary | ICD-10-CM | POA: Diagnosis not present

## 2018-09-30 DIAGNOSIS — Z780 Asymptomatic menopausal state: Secondary | ICD-10-CM | POA: Diagnosis not present

## 2018-09-30 DIAGNOSIS — Z1231 Encounter for screening mammogram for malignant neoplasm of breast: Secondary | ICD-10-CM | POA: Diagnosis not present

## 2018-09-30 DIAGNOSIS — R8290 Unspecified abnormal findings in urine: Secondary | ICD-10-CM | POA: Diagnosis not present

## 2018-10-07 MED FILL — ROSUVASTATIN CALCIUM 10 MG: 10 | 90 days supply | Qty: 90 | Fill #1

## 2018-10-08 MED FILL — LISINOPRIL-HCTZ 20-12.5 MG: 20-12.5 | 90 days supply | Qty: 90 | Fill #1

## 2018-11-21 ENCOUNTER — Other Ambulatory Visit: Payer: Self-pay | Admitting: Nurse Practitioner

## 2018-11-21 DIAGNOSIS — E109 Type 1 diabetes mellitus without complications: Secondary | ICD-10-CM

## 2018-11-21 MED FILL — HumaLOG 100 UNIT/ML SOLN: 100 | 80 days supply | Qty: 40 | Fill #0

## 2018-11-24 ENCOUNTER — Other Ambulatory Visit: Payer: Self-pay

## 2018-11-24 ENCOUNTER — Telehealth (INDEPENDENT_AMBULATORY_CARE_PROVIDER_SITE_OTHER): Payer: 59 | Admitting: Nurse Practitioner

## 2018-11-24 ENCOUNTER — Other Ambulatory Visit: Payer: Self-pay | Admitting: Nurse Practitioner

## 2018-11-24 DIAGNOSIS — E785 Hyperlipidemia, unspecified: Secondary | ICD-10-CM

## 2018-11-24 DIAGNOSIS — I119 Hypertensive heart disease without heart failure: Secondary | ICD-10-CM

## 2018-11-24 DIAGNOSIS — E109 Type 1 diabetes mellitus without complications: Secondary | ICD-10-CM

## 2018-11-24 DIAGNOSIS — Z6831 Body mass index (BMI) 31.0-31.9, adult: Secondary | ICD-10-CM | POA: Diagnosis not present

## 2018-11-24 MED ORDER — ROSUVASTATIN CALCIUM 10 MG PO TABS: 10.0000 mg | ORAL_TABLET | Freq: Every day | ORAL | 1 refills | Status: DC

## 2018-11-24 MED ORDER — LISINOPRIL-HYDROCHLOROTHIAZIDE 20-12.5 MG PO TABS: 1.0000 | ORAL_TABLET | Freq: Every day | ORAL | 1 refills | Status: DC

## 2018-11-24 MED ORDER — INSULIN LISPRO 100 UNIT/ML ~~LOC~~ SOLN: SUBCUTANEOUS | 3 refills | Status: DC

## 2018-11-24 NOTE — Patient Instructions (Signed)
Diabetes Mellitus and Foot Care  Foot care is an important part of your health, especially when you have diabetes. Diabetes may cause you to have problems because of poor blood flow (circulation) to your feet and legs, which can cause your skin to:   Become thinner and drier.   Break more easily.   Heal more slowly.   Peel and crack.  You may also have nerve damage (neuropathy) in your legs and feet, causing decreased feeling in them. This means that you may not notice minor injuries to your feet that could lead to more serious problems. Noticing and addressing any potential problems early is the best way to prevent future foot problems.  How to care for your feet  Foot hygiene   Wash your feet daily with warm water and mild soap. Do not use hot water. Then, pat your feet and the areas between your toes until they are completely dry. Do not soak your feet as this can dry your skin.   Trim your toenails straight across. Do not dig under them or around the cuticle. File the edges of your nails with an emery board or nail file.   Apply a moisturizing lotion or petroleum jelly to the skin on your feet and to dry, brittle toenails. Use lotion that does not contain alcohol and is unscented. Do not apply lotion between your toes.  Shoes and socks   Wear clean socks or stockings every day. Make sure they are not too tight. Do not wear knee-high stockings since they may decrease blood flow to your legs.   Wear shoes that fit properly and have enough cushioning. Always look in your shoes before you put them on to be sure there are no objects inside.   To break in new shoes, wear them for just a few hours a day. This prevents injuries on your feet.  Wounds, scrapes, corns, and calluses   Check your feet daily for blisters, cuts, bruises, sores, and redness. If you cannot see the bottom of your feet, use a mirror or ask someone for help.   Do not cut corns or calluses or try to remove them with medicine.   If you  find a minor scrape, cut, or break in the skin on your feet, keep it and the skin around it clean and dry. You may clean these areas with mild soap and water. Do not clean the area with peroxide, alcohol, or iodine.   If you have a wound, scrape, corn, or callus on your foot, look at it several times a day to make sure it is healing and not infected. Check for:  ? Redness, swelling, or pain.  ? Fluid or blood.  ? Warmth.  ? Pus or a bad smell.  General instructions   Do not cross your legs. This may decrease blood flow to your feet.   Do not use heating pads or hot water bottles on your feet. They may burn your skin. If you have lost feeling in your feet or legs, you may not know this is happening until it is too late.   Protect your feet from hot and cold by wearing shoes, such as at the beach or on hot pavement.   Schedule a complete foot exam at least once a year (annually) or more often if you have foot problems. If you have foot problems, report any cuts, sores, or bruises to your health care provider immediately.  Contact a health care provider if:     You have a medical condition that increases your risk of infection and you have any cuts, sores, or bruises on your feet.   You have an injury that is not healing.   You have redness on your legs or feet.   You feel burning or tingling in your legs or feet.   You have pain or cramps in your legs and feet.   Your legs or feet are numb.   Your feet always feel cold.   You have pain around a toenail.  Get help right away if:   You have a wound, scrape, corn, or callus on your foot and:  ? You have pain, swelling, or redness that gets worse.  ? You have fluid or blood coming from the wound, scrape, corn, or callus.  ? Your wound, scrape, corn, or callus feels warm to the touch.  ? You have pus or a bad smell coming from the wound, scrape, corn, or callus.  ? You have a fever.  ? You have a red line going up your leg.  Summary   Check your feet every day  for cuts, sores, red spots, swelling, and blisters.   Moisturize feet and legs daily.   Wear shoes that fit properly and have enough cushioning.   If you have foot problems, report any cuts, sores, or bruises to your health care provider immediately.   Schedule a complete foot exam at least once a year (annually) or more often if you have foot problems.  This information is not intended to replace advice given to you by your health care provider. Make sure you discuss any questions you have with your health care provider.  Document Released: 08/17/2000 Document Revised: 10/02/2017 Document Reviewed: 09/21/2016  Elsevier Interactive Patient Education  2019 Elsevier Inc[employer]

## 2018-11-24 NOTE — Progress Notes (Signed)
Virtual Visit via telephone Note  I connected with Maria Conley on 11/24/18 at 12:20 by telephone and verified that I am speaking with the correct person using two identifiers. Maria Conley is currently located at home and no one is currently with her during visit. The provider, Mary-Margaret Daphine Deutscher, FNP is located in their office at time of visit.  I discussed the limitations, risks, security and privacy concerns of performing an evaluation and management service by telephone and the availability of in person appointments. I also discussed with the patient that there may be a patient responsible charge related to this service. The patient expressed understanding and agreed to proceed.   History and Present Illness:   Chief Complaint: medical management of chronic issues  HPI:  1. Benign hypertensive heart disease without heart failure  No c/o chest pain, sob or headache. Does not check blood pressure at home. BP Readings from Last 3 Encounters:  08/25/18 114/68  05/22/18 125/75  03/31/18 132/72     2. Hyperlipidemia with target LDL less than 100  Watches diet and exercises 2-3 x a week  3. Type 1 diabetes mellitus without complication (HCC)  Last hgab1c was 7.0. blood sugars running below 160 mostly but has had a few highs due to stress  4. BMI 31.0-31.9,adult  No recent weight changes    Outpatient Encounter Medications as of 11/24/2018  Medication Sig  . aspirin EC 81 MG tablet Take 81 mg by mouth daily.  . CONTOUR NEXT TEST test strip USE AS DIRECTED 4 TO 5 TIMES DAILY AND AS NEEDED  . HUMALOG 100 UNIT/ML injection USE UP TO 50 UNITS ONCE DAILY IN INSULIN PUMP  . Insulin Infusion Pump Supplies (MINIMED INFUSION SET-MMT 399) MISC Use with insulin pump. Change infusion set q 3 days.  Marland Kitchen lisinopril-hydrochlorothiazide (PRINZIDE,ZESTORETIC) 20-12.5 MG tablet Take 1 tablet by mouth daily.  . rosuvastatin (CRESTOR) 10 MG tablet Take 1 tablet (10 mg total) by mouth  daily.    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   Diagnosis and orders addressed:  1. Benign hypertensive heart disease without heart failure Low sodium diet - lisinopril-hydrochlorothiazide (PRINZIDE,ZESTORETIC) 20-12.5 MG tablet; Take 1 tablet by mouth daily.  Dispense: 90 tablet; Refill: 1  2. Hyperlipidemia with target LDL less than 100 Low fat diet - rosuvastatin (CRESTOR) 10 MG tablet; Take 1 tablet (10 mg total) by mouth daily.  Dispense: 90 tablet; Refill: 1  3. Type 1 diabetes mellitus without complication (HCC) Continue to watch carbs in diet - insulin lispro (HUMALOG) 100 UNIT/ML injection; Insulin pump  Dispense: 60 mL; Refill: 3  4. BMI 31.0-31.9,adult Discussed diet and exercise for person with BMI >25 Will recheck weight in 3-6 months    Labs reviewed from last visit Health Maintenance reviewed Diet and exercise encouraged     Follow Up Instructions: 3 months    I discussed the assessment and treatment plan with the patient. The patient was provided an opportunity to ask questions and all were answered. The patient agreed with the plan and demonstrated an understanding of the instructions.   The patient was advised to call back or seek an in-person evaluation if the symptoms worsen or if the condition fails  to improve as anticipated.  The above assessment and management plan was discussed with the patient. The patient verbalized understanding of and has agreed to the management plan. Patient is aware to call the clinic if symptoms persist or worsen. Patient is aware when to return to the clinic for a follow-up visit. Patient educated on when it is appropriate to go to the emergency  department.    I provided 15  minutes of non-face-to-face time during this encounter.    Mary-Margaret Daphine Deutscher, FNP

## 2019-01-28 ENCOUNTER — Other Ambulatory Visit: Payer: Self-pay | Admitting: Pharmacist

## 2019-02-03 MED FILL — HumaLOG 100 UNIT/ML SOLN: 100 | 80 days supply | Qty: 40 | Fill #1

## 2019-02-03 MED FILL — ROSUVASTATIN CALCIUM 10 MG: 10 | 90 days supply | Qty: 90 | Fill #0

## 2019-02-03 MED FILL — LISINOPRIL-HCTZ 20-12.5 MG: 20-12.5 | 90 days supply | Qty: 90 | Fill #0

## 2019-02-09 DIAGNOSIS — E108 Type 1 diabetes mellitus with unspecified complications: Secondary | ICD-10-CM | POA: Diagnosis not present

## 2019-02-09 DIAGNOSIS — E109 Type 1 diabetes mellitus without complications: Secondary | ICD-10-CM | POA: Diagnosis not present

## 2019-02-11 DIAGNOSIS — E109 Type 1 diabetes mellitus without complications: Secondary | ICD-10-CM | POA: Diagnosis not present

## 2019-02-11 DIAGNOSIS — E108 Type 1 diabetes mellitus with unspecified complications: Secondary | ICD-10-CM | POA: Diagnosis not present

## 2019-02-26 ENCOUNTER — Telehealth: Payer: Self-pay | Admitting: Nurse Practitioner

## 2019-02-26 MED ORDER — PREDNISONE 20 MG PO TABS: ORAL_TABLET | ORAL | 0 refills | Status: DC

## 2019-02-26 MED FILL — predniSONE 20 MG TABS: 20 | 5 days supply | Qty: 10 | Fill #0

## 2019-02-26 NOTE — Telephone Encounter (Signed)
Please review and advise.

## 2019-02-27 ENCOUNTER — Other Ambulatory Visit: Payer: Self-pay | Admitting: Nurse Practitioner

## 2019-02-27 DIAGNOSIS — E109 Type 1 diabetes mellitus without complications: Secondary | ICD-10-CM

## 2019-02-27 MED FILL — CONTOUR NEXT STRIPS: 90 days supply | Qty: 450 | Fill #0

## 2019-05-04 DIAGNOSIS — E108 Type 1 diabetes mellitus with unspecified complications: Secondary | ICD-10-CM | POA: Diagnosis not present

## 2019-05-04 DIAGNOSIS — E109 Type 1 diabetes mellitus without complications: Secondary | ICD-10-CM | POA: Diagnosis not present

## 2019-05-14 MED FILL — HumaLOG 100 UNIT/ML SOLN: 100 | 80 days supply | Qty: 40 | Fill #2

## 2019-06-10 ENCOUNTER — Other Ambulatory Visit: Payer: Self-pay

## 2019-06-10 MED FILL — ROSUVASTATIN CALCIUM 10 MG: 10 | 90 days supply | Qty: 90 | Fill #1

## 2019-06-10 MED FILL — LISINOPRIL-HCTZ 20-12.5 MG: 20-12.5 | 90 days supply | Qty: 90 | Fill #1

## 2019-06-11 ENCOUNTER — Other Ambulatory Visit: Payer: Self-pay | Admitting: Nurse Practitioner

## 2019-06-11 ENCOUNTER — Encounter: Payer: Self-pay | Admitting: Nurse Practitioner

## 2019-06-11 ENCOUNTER — Ambulatory Visit (INDEPENDENT_AMBULATORY_CARE_PROVIDER_SITE_OTHER): Payer: 59 | Admitting: Nurse Practitioner

## 2019-06-11 VITALS — BP 113/66 | HR 76 | Temp 98.4°F | Resp 16 | Ht 65.0 in | Wt 190.0 lb

## 2019-06-11 DIAGNOSIS — E109 Type 1 diabetes mellitus without complications: Secondary | ICD-10-CM

## 2019-06-11 DIAGNOSIS — E785 Hyperlipidemia, unspecified: Secondary | ICD-10-CM

## 2019-06-11 DIAGNOSIS — I119 Hypertensive heart disease without heart failure: Secondary | ICD-10-CM

## 2019-06-11 DIAGNOSIS — M545 Low back pain, unspecified: Secondary | ICD-10-CM

## 2019-06-11 DIAGNOSIS — Z6831 Body mass index (BMI) 31.0-31.9, adult: Secondary | ICD-10-CM

## 2019-06-11 LAB — BAYER DCA HB A1C WAIVED: HB A1C (BAYER DCA - WAIVED): 6.6 %

## 2019-06-11 MED ORDER — ROSUVASTATIN CALCIUM 10 MG PO TABS: 10.0000 mg | ORAL_TABLET | Freq: Every day | ORAL | 1 refills | Status: DC

## 2019-06-11 MED ORDER — CYCLOBENZAPRINE HCL 10 MG PO TABS: 10.0000 mg | ORAL_TABLET | Freq: Three times a day (TID) | ORAL | 1 refills | Status: DC | PRN

## 2019-06-11 MED ORDER — LISINOPRIL-HYDROCHLOROTHIAZIDE 20-12.5 MG PO TABS: 1.0000 | ORAL_TABLET | Freq: Every day | ORAL | 1 refills | Status: DC

## 2019-06-11 MED ORDER — INSULIN LISPRO 100 UNIT/ML ~~LOC~~ SOLN: SUBCUTANEOUS | 5 refills | Status: DC

## 2019-06-11 MED FILL — CYCLOBENZAPRINE HCL 10 MG T: 10 | 10 days supply | Qty: 30 | Fill #0

## 2019-06-11 NOTE — Progress Notes (Signed)
Subjective:    Patient ID: Maria Conley, female    DOB: 08-29-1966, 53 y.o.   MRN: [id number]   Chief Complaint: medical management of chronic issues   HPI:  1. Benign hypertensive heart disease without heart failure No c/o chest pain, sob or headache. Checks blood pressure at work occasionally and is always good. BP Readings from Last 3 Encounters:  08/25/18 114/68  05/22/18 125/75  03/31/18 132/72     2. Hyperlipidemia with target LDL less than 100 Does try to watch diet and does exercise occasionally. Lab Results  Component Value Date   CHOL 172 08/25/2018   HDL 57 08/25/2018   LDLCALC 97 08/25/2018   TRIG 91 08/25/2018   CHOLHDL 3.0 08/25/2018     3. Type 1 diabetes mellitus without complication (HCC) blood sugar fasting below 140 consistently. Is on insulin pump. BP Readings from Last 3 Encounters:  08/25/18 114/68  05/22/18 125/75  03/31/18 132/72     4. BMI 31.0-31.9,adult No recent weight changes.    Outpatient Encounter Medications as of 06/11/2019  Medication Sig  . aspirin EC 81 MG tablet Take 81 mg by mouth daily.  . CONTOUR NEXT TEST test strip USE AS DIRECTED 4 TO 5 TIMES DAILY AND AS NEEDED  . Insulin Infusion Pump Supplies (MINIMED INFUSION SET-MMT 399) MISC Use with insulin pump. Change infusion set q 3 days.  . insulin lispro (HUMALOG) 100 UNIT/ML injection Insulin pump  . lisinopril-hydrochlorothiazide (PRINZIDE,ZESTORETIC) 20-12.5 MG tablet Take 1 tablet by mouth daily.  . predniSONE (DELTASONE) 20 MG tablet 2 po at sametime daily for 5 days  . rosuvastatin (CRESTOR) 10 MG tablet Take 1 tablet (10 mg total) by mouth daily.     Past Surgical History:  Procedure Laterality Date  . ABLATION    . ASD REPAIR    . CESAREAN SECTION    . COSMETIC SURGERY    . TUBAL LIGATION      Family History  Problem Relation Age of Onset  . Heart disease Mother   . Hypertension Mother   . Diabetes Father   . 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  Social history: Has transferred to [place]  Controlled substance contract: n/a    Review of Systems  Constitutional: Negative for activity change and appetite change.  HENT: Negative.   Eyes: Negative for pain.  Respiratory: Negative for shortness of breath.   Cardiovascular: Negative for chest pain, palpitations and leg swelling.  Gastrointestinal: Negative for abdominal pain.  Endocrine: Negative for polydipsia.  Genitourinary: Negative.   Skin: Negative for rash.  Neurological: Negative for dizziness, weakness and headaches.  Hematological: Does not bruise/bleed easily.  Psychiatric/Behavioral: Negative.   All other systems reviewed and are negative.      Objective:   Physical Exam Vitals signs and nursing note reviewed.  Constitutional:      General: She is not in acute distress.    Appearance: Normal appearance. She is well-developed.  HENT:     Head: Normocephalic.     Nose: Nose normal.  Eyes:     Pupils: Pupils are equal, round, and reactive to light.  Neck:     Musculoskeletal: Normal range of motion and neck supple.     Vascular: No carotid bruit or JVD.  Cardiovascular:     Rate and Rhythm: Normal rate and regular rhythm.     Heart sounds: Normal heart  sounds.  Pulmonary:     Effort: Pulmonary effort is normal. No respiratory distress.     Breath sounds: Normal breath sounds. No wheezing or rales.  Chest:     Chest wall: No tenderness.  Abdominal:     General: Bowel sounds are normal. There is no distension or abdominal bruit.     Palpations: Abdomen is soft. There is no hepatomegaly, splenomegaly, mass or pulsatile mass.     Tenderness: There is no abdominal tenderness.  Musculoskeletal: Normal range of motion.  Lymphadenopathy:     Cervical: No cervical adenopathy.  Skin:    General: Skin is warm and dry.  Neurological:     Mental Status: She is alert and oriented to  person, place, and time.     Deep Tendon Reflexes: Reflexes are normal and symmetric.  Psychiatric:        Behavior: Behavior normal.        Thought Content: Thought content normal.        Judgment: Judgment normal.     BP 113/66   Pulse 76   Temp 98.4 F (36.9 C) (Temporal)   Resp 16   Ht '5\' 5"'  (1.651 m)   Wt 190 lb (86.2 kg)   SpO2 97%   BMI 31.62 kg/m   Hgba1c 6.6%     Assessment & Plan:  BRAELEY BUSKEY comes in today with chief complaint of Medical Management of Chronic Issues   Diagnosis and orders addressed:  1. Benign hypertensive heart disease without heart failure Low sodium diet - CMP14+EGFR - lisinopril-hydrochlorothiazide (ZESTORETIC) 20-12.5 MG tablet; Take 1 tablet by mouth daily.  Dispense: 90 tablet; Refill: 1  2. Hyperlipidemia with target LDL less than 100 Low fat diet - Lipid panel - rosuvastatin (CRESTOR) 10 MG tablet; Take 1 tablet (10 mg total) by mouth daily.  Dispense: 90 tablet; Refill: 1  3. Type 1 diabetes mellitus without complication (HCC) Continue to watch carbs in diet - Bayer DCA Hb A1c Waived - Microalbumin / creatinine urine ratio - insulin lispro (HUMALOG) 100 UNIT/ML injection; Insulin pump  Dispense: 60 mL; Refill: 5  4. BMI 31.0-31.9,adult Discussed diet and exercise for person with BMI >25 Will recheck weight in 3-6 months  5. Low back pain Flexeril 57m 1/2-1 po prn Moist heat  Labs pending Health Maintenance reviewed Diet and exercise encouraged  Follow up plan: 3 months   Mary-Margaret MHassell Done FNP

## 2019-06-11 NOTE — Telephone Encounter (Signed)
From pharmacy:  ++please verify direction. how much can pt use daily+++

## 2019-06-11 NOTE — Patient Instructions (Signed)

## 2019-06-12 ENCOUNTER — Other Ambulatory Visit: Payer: Self-pay

## 2019-06-12 DIAGNOSIS — E109 Type 1 diabetes mellitus without complications: Secondary | ICD-10-CM

## 2019-06-12 LAB — CMP14+EGFR
ALT: 13 IU/L (ref 0–32)
AST: 13 IU/L (ref 0–40)
Albumin/Globulin Ratio: 1.9 (ref 1.2–2.2)
Albumin: 3.8 g/dL (ref 3.8–4.9)
Alkaline Phosphatase: 73 IU/L (ref 39–117)
BUN/Creatinine Ratio: 10 (ref 9–23)
BUN: 7 mg/dL (ref 6–24)
Bilirubin Total: 0.4 mg/dL (ref 0.0–1.2)
CO2: 25 mmol/L (ref 20–29)
Calcium: 8.9 mg/dL (ref 8.7–10.2)
Chloride: 101 mmol/L (ref 96–106)
Creatinine, Ser: 0.69 mg/dL (ref 0.57–1.00)
GFR calc Af Amer: 114 mL/min/{1.73_m2} (ref >59)
GFR calc non Af Amer: 99 mL/min/{1.73_m2} (ref >59)
Globulin, Total: 2 g/dL (ref 1.5–4.5)
Glucose: 246 mg/dL — ABNORMAL HIGH (ref 65–99)
Potassium: 4.2 mmol/L (ref 3.5–5.2)
Sodium: 135 mmol/L (ref 134–144)
Total Protein: 5.8 g/dL — ABNORMAL LOW (ref 6.0–8.5)

## 2019-06-12 LAB — MICROALBUMIN / CREATININE URINE RATIO
Creatinine, Urine: 98.6 mg/dL
Microalb/Creat Ratio: 11 mg/g creat (ref 0–29)
Microalbumin, Urine: 10.4 ug/mL

## 2019-06-12 LAB — LIPID PANEL
Chol/HDL Ratio: 2.6 ratio (ref 0.0–4.4)
Cholesterol, Total: 151 mg/dL (ref 100–199)
HDL: 58 mg/dL (ref >39)
LDL Chol Calc (NIH): 76 mg/dL (ref 0–99)
Triglycerides: 88 mg/dL (ref 0–149)
VLDL Cholesterol Cal: 17 mg/dL (ref 5–40)

## 2019-06-12 MED ORDER — INSULIN LISPRO 100 UNIT/ML ~~LOC~~ SOLN: SUBCUTANEOUS | 5 refills | Status: DC

## 2019-06-24 DIAGNOSIS — Z029 Encounter for administrative examinations, unspecified: Secondary | ICD-10-CM

## 2019-07-29 DIAGNOSIS — E109 Type 1 diabetes mellitus without complications: Secondary | ICD-10-CM | POA: Diagnosis not present

## 2019-07-29 DIAGNOSIS — E108 Type 1 diabetes mellitus with unspecified complications: Secondary | ICD-10-CM | POA: Diagnosis not present

## 2019-09-17 ENCOUNTER — Other Ambulatory Visit: Payer: Self-pay

## 2019-09-18 ENCOUNTER — Ambulatory Visit (INDEPENDENT_AMBULATORY_CARE_PROVIDER_SITE_OTHER): Payer: 59 | Admitting: Nurse Practitioner

## 2019-09-18 ENCOUNTER — Encounter: Payer: Self-pay | Admitting: Nurse Practitioner

## 2019-09-18 VITALS — BP 117/71 | HR 70 | Temp 97.5°F | Resp 20 | Ht 65.0 in | Wt 182.0 lb

## 2019-09-18 DIAGNOSIS — E109 Type 1 diabetes mellitus without complications: Secondary | ICD-10-CM

## 2019-09-18 DIAGNOSIS — I119 Hypertensive heart disease without heart failure: Secondary | ICD-10-CM

## 2019-09-18 DIAGNOSIS — E785 Hyperlipidemia, unspecified: Secondary | ICD-10-CM

## 2019-09-18 LAB — BAYER DCA HB A1C WAIVED: HB A1C (BAYER DCA - WAIVED): 6.8 % (ref <7.0)

## 2019-09-18 MED ORDER — LISINOPRIL-HYDROCHLOROTHIAZIDE 20-12.5 MG PO TABS: 1.0000 | ORAL_TABLET | Freq: Every day | ORAL | 1 refills | Status: DC

## 2019-09-18 MED ORDER — INSULIN LISPRO 100 UNIT/ML ~~LOC~~ SOLN: SUBCUTANEOUS | 5 refills | Status: DC

## 2019-09-18 MED ORDER — ROSUVASTATIN CALCIUM 10 MG PO TABS: 10.0000 mg | ORAL_TABLET | Freq: Every day | ORAL | 1 refills | Status: DC

## 2019-09-18 MED FILL — LISINOPRIL-HCTZ 20-12.5 MG: 20-12.5 | 90 days supply | Qty: 90 | Fill #0

## 2019-09-18 MED FILL — HumaLOG 100 UNIT/ML SOLN: 100 | 90 days supply | Qty: 50 | Fill #0

## 2019-09-18 MED FILL — ROSUVASTATIN CALCIUM 10 MG: 10 | 90 days supply | Qty: 90 | Fill #0

## 2019-09-18 NOTE — Patient Instructions (Signed)
Diabetes Mellitus and Foot Care Foot care is an important part of your health, especially when you have diabetes. Diabetes may cause you to have problems because of poor blood flow (circulation) to your feet and legs, which can cause your skin to:  Become thinner and drier.  Break more easily.  Heal more slowly.  Peel and crack. You may also have nerve damage (neuropathy) in your legs and feet, causing decreased feeling in them. This means that you may not notice minor injuries to your feet that could lead to more serious problems. Noticing and addressing any potential problems early is the best way to prevent future foot problems. How to care for your feet Foot hygiene  Wash your feet daily with warm water and mild soap. Do not use hot water. Then, pat your feet and the areas between your toes until they are completely dry. Do not soak your feet as this can dry your skin.  Trim your toenails straight across. Do not dig under them or around the cuticle. File the edges of your nails with an emery board or nail file.  Apply a moisturizing lotion or petroleum jelly to the skin on your feet and to dry, brittle toenails. Use lotion that does not contain alcohol and is unscented. Do not apply lotion between your toes. Shoes and socks  Wear clean socks or stockings every day. Make sure they are not too tight. Do not wear knee-high stockings since they may decrease blood flow to your legs.  Wear shoes that fit properly and have enough cushioning. Always look in your shoes before you put them on to be sure there are no objects inside.  To break in new shoes, wear them for just a few hours a day. This prevents injuries on your feet. Wounds, scrapes, corns, and calluses  Check your feet daily for blisters, cuts, bruises, sores, and redness. If you cannot see the bottom of your feet, use a mirror or ask someone for help.  Do not cut corns or calluses or try to remove them with medicine.  If you  find a minor scrape, cut, or break in the skin on your feet, keep it and the skin around it clean and dry. You may clean these areas with mild soap and water. Do not clean the area with peroxide, alcohol, or iodine.  If you have a wound, scrape, corn, or callus on your foot, look at it several times a day to make sure it is healing and not infected. Check for: ? Redness, swelling, or pain. ? Fluid or blood. ? Warmth. ? Pus or a bad smell. General instructions  Do not cross your legs. This may decrease blood flow to your feet.  Do not use heating pads or hot water bottles on your feet. They may burn your skin. If you have lost feeling in your feet or legs, you may not know this is happening until it is too late.  Protect your feet from hot and cold by wearing shoes, such as at the beach or on hot pavement.  Schedule a complete foot exam at least once a year (annually) or more often if you have foot problems. If you have foot problems, report any cuts, sores, or bruises to your health care provider immediately. Contact a health care provider if:  You have a medical condition that increases your risk of infection and you have any cuts, sores, or bruises on your feet.  You have an injury that is not   healing.  You have redness on your legs or feet.  You feel burning or tingling in your legs or feet.  You have pain or cramps in your legs and feet.  Your legs or feet are numb.  Your feet always feel cold.  You have pain around a toenail. Get help right away if:  You have a wound, scrape, corn, or callus on your foot and: ? You have pain, swelling, or redness that gets worse. ? You have fluid or blood coming from the wound, scrape, corn, or callus. ? Your wound, scrape, corn, or callus feels warm to the touch. ? You have pus or a bad smell coming from the wound, scrape, corn, or callus. ? You have a fever. ? You have a red line going up your leg. Summary  Check your feet every day  for cuts, sores, red spots, swelling, and blisters.  Moisturize feet and legs daily.  Wear shoes that fit properly and have enough cushioning.  If you have foot problems, report any cuts, sores, or bruises to your health care provider immediately.  Schedule a complete foot exam at least once a year (annually) or more often if you have foot problems. This information is not intended to replace advice given to you by your health care provider. Make sure you discuss any questions you have with your health care provider. Document Revised: 05/13/2019 Document Reviewed: 09/21/2016 Elsevier Patient Education  2020 [employer]  

## 2019-09-18 NOTE — Progress Notes (Signed)
Subjective:    Patient ID: Maria Conley, female    DOB: 02/12/1966, 54 y.o.   MRN: [id number]   Chief Complaint: Medical Management of Chronic Issues    HPI:  1. Hyperlipidemia with target LDL less than 100 Does try to watch diet. She walks at least a mile a day.  2. Type 1 diabetes mellitus without complication (HCC) Blood sugars have been up and down but average around 150.  3. Benign hypertensive heart disease without heart failure No c/o chest pain, sob or headache. She checks her blod presure at home na dit is allways normal.    Outpatient Encounter Medications as of 09/18/2019  Medication Sig  . aspirin EC 81 MG tablet Take 81 mg by mouth daily.  . CONTOUR NEXT TEST test strip USE AS DIRECTED 4 TO 5 TIMES DAILY AND AS NEEDED  . Insulin Infusion Pump Supplies (MINIMED INFUSION SET-MMT 399) MISC Use with insulin pump. Change infusion set q 3 days.  . insulin lispro (HUMALOG) 100 UNIT/ML injection Use up to 50 units daily in insulin pump  . lisinopril-hydrochlorothiazide (ZESTORETIC) 20-12.5 MG tablet Take 1 tablet by mouth daily.  . rosuvastatin (CRESTOR) 10 MG tablet Take 1 tablet (10 mg total) by mouth daily.  . cyclobenzaprine (FLEXERIL) 10 MG tablet Take 1 tablet (10 mg total) by mouth 3 (three) times daily as needed for muscle spasms. (Patient not taking: Reported on 09/18/2019)     Past Surgical History:  Procedure Laterality Date  . ABLATION    . ASD REPAIR    . CESAREAN SECTION    . COSMETIC SURGERY    . TUBAL LIGATION      Family History  Problem Relation Age of Onset  . Heart disease Mother   . Hypertension Mother   . Diabetes Father   . 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  Eyes: Negative for pain.  Respiratory: Negative for shortness of breath.   Cardiovascular: Negative for chest  pain, palpitations and leg swelling.  Gastrointestinal: Negative for abdominal pain.  Endocrine: Negative for polydipsia.  Skin: Negative for rash.  Neurological: Negative for dizziness, weakness and headaches.  Hematological: Does not bruise/bleed easily.  All other systems reviewed and are negative.      Objective:   Physical Exam Vitals and nursing note reviewed.  Constitutional:      General: She is not in acute distress.    Appearance: Normal appearance. She is well-developed.  HENT:     Head: Normocephalic.     Nose: Nose normal.  Eyes:     Pupils: Pupils are equal, round, and reactive to light.  Neck:     Vascular: No carotid bruit or JVD.  Cardiovascular:     Rate and Rhythm: Normal rate and regular rhythm.     Heart sounds: Normal heart sounds.  Pulmonary:     Effort: Pulmonary effort is normal. No respiratory distress.     Breath sounds: Normal breath sounds. No wheezing or rales.  Chest:     Chest wall: No tenderness.  Abdominal:     General: Bowel sounds are normal. There is no distension or abdominal bruit.     Palpations: Abdomen is soft. There is no hepatomegaly, splenomegaly, mass or pulsatile mass.     Tenderness: There is no abdominal tenderness.  Musculoskeletal:  General: Normal range of motion.     Cervical back: Normal range of motion and neck supple.  Lymphadenopathy:     Cervical: No cervical adenopathy.  Skin:    General: Skin is warm and dry.  Neurological:     Mental Status: She is alert and oriented to person, place, and time.     Deep Tendon Reflexes: Reflexes are normal and symmetric.  Psychiatric:        Behavior: Behavior normal.        Thought Content: Thought content normal.        Judgment: Judgment normal.     BP 117/71   Pulse 70   Temp (!) 97.5 F (36.4 C) (Temporal)   Resp 20   Ht '5\' 5"'  (1.651 m)   Wt 182 lb (82.6 kg)   SpO2 100%   BMI 30.29 kg/m   hgba1c 6.8%     Assessment & Plan:  Maria Conley  comes in today with chief complaint of Medical Management of Chronic Issues   Diagnosis and orders addressed:  1. Hyperlipidemia with target LDL less than 100 Low fat diet - Lipid panel - rosuvastatin (CRESTOR) 10 MG tablet; Take 1 tablet (10 mg total) by mouth daily.  Dispense: 90 tablet; Refill: 1  2. Type 1 diabetes mellitus without complication (HCC) Continue to watch carbs in diet - hgba1c - insulin lispro (HUMALOG) 100 UNIT/ML injection; Use up to 50 units daily in insulin pump  Dispense: 60 mL; Refill: 5  3. Benign hypertensive heart disease without heart failure Low sodium diet - CMP14+EGFR - lisinopril-hydrochlorothiazide (ZESTORETIC) 20-12.5 MG tablet; Take 1 tablet by mouth daily.  Dispense: 90 tablet; Refill: 1   Labs pending Health Maintenance reviewed Diet and exercise encouraged  Follow up plan: 3 months   Mary-Margaret Hassell Done, FNP

## 2019-09-19 LAB — CMP14+EGFR
ALT: 17 IU/L (ref 0–32)
AST: 16 IU/L (ref 0–40)
Albumin/Globulin Ratio: 2.5 — ABNORMAL HIGH (ref 1.2–2.2)
Albumin: 4.5 g/dL (ref 3.8–4.9)
Alkaline Phosphatase: 77 IU/L (ref 39–117)
BUN/Creatinine Ratio: 7 — ABNORMAL LOW (ref 9–23)
BUN: 6 mg/dL (ref 6–24)
Bilirubin Total: 0.5 mg/dL (ref 0.0–1.2)
CO2: 23 mmol/L (ref 20–29)
Calcium: 9.4 mg/dL (ref 8.7–10.2)
Chloride: 101 mmol/L (ref 96–106)
Creatinine, Ser: 0.85 mg/dL (ref 0.57–1.00)
GFR calc Af Amer: 89 mL/min/{1.73_m2} (ref >59)
GFR calc non Af Amer: 77 mL/min/{1.73_m2} (ref >59)
Globulin, Total: 1.8 g/dL (ref 1.5–4.5)
Glucose: 201 mg/dL — ABNORMAL HIGH (ref 65–99)
Potassium: 4 mmol/L (ref 3.5–5.2)
Sodium: 140 mmol/L (ref 134–144)
Total Protein: 6.3 g/dL (ref 6.0–8.5)

## 2019-09-19 LAB — LIPID PANEL
Chol/HDL Ratio: 2.6 ratio (ref 0.0–4.4)
Cholesterol, Total: 169 mg/dL (ref 100–199)
HDL: 64 mg/dL (ref >39)
LDL Chol Calc (NIH): 93 mg/dL (ref 0–99)
Triglycerides: 64 mg/dL (ref 0–149)
VLDL Cholesterol Cal: 12 mg/dL (ref 5–40)

## 2019-11-10 DIAGNOSIS — E109 Type 1 diabetes mellitus without complications: Secondary | ICD-10-CM | POA: Diagnosis not present

## 2019-11-10 DIAGNOSIS — E108 Type 1 diabetes mellitus with unspecified complications: Secondary | ICD-10-CM | POA: Diagnosis not present

## 2019-11-23 ENCOUNTER — Encounter: Payer: Self-pay | Admitting: Nurse Practitioner

## 2019-11-23 ENCOUNTER — Ambulatory Visit (INDEPENDENT_AMBULATORY_CARE_PROVIDER_SITE_OTHER): Payer: 59

## 2019-11-23 ENCOUNTER — Ambulatory Visit
Admission: EM | Admit: 2019-11-23 | Discharge: 2019-11-23 | Disposition: A | Discharge status: Home / Self Care | Payer: 59 | Attending: Emergency Medicine | Admitting: Emergency Medicine

## 2019-11-23 DIAGNOSIS — S93402A Sprain of unspecified ligament of left ankle, initial encounter: Secondary | ICD-10-CM | POA: Diagnosis not present

## 2019-11-23 DIAGNOSIS — M25472 Effusion, left ankle: Secondary | ICD-10-CM | POA: Diagnosis not present

## 2019-11-23 DIAGNOSIS — M25572 Pain in left ankle and joints of left foot: Secondary | ICD-10-CM

## 2019-11-23 NOTE — Discharge Instructions (Addendum)
Continue to take OTC Tylenol for pain management Follow RICE instruction as attached Follow with primary care Return or go to ED for worsening symptoms

## 2019-11-23 NOTE — ED Provider Notes (Addendum)
[facility]    CSN: [id number] Arrival date & time: 11/23/19  1740      History   Chief Complaint Chief Complaint  Patient presents with  . Ankle Injury    HPI MEMORIE Maria Conley is a 54 y.o. female.   Who presented to the urgent care with complaint of left ankle pain for the past 1 day.  Reports she twisted her ankle.  She localized the pain to her left ankle.  She described as constant and achy and rated at 5 on a scale of 1-10.  She has tried OTC medication without relief.  Her symptoms are made worse with range of motion.  She denies similar symptoms in the past.  The history is provided by the patient. No language interpreter was used.  Ankle Injury    Past Medical History:  Diagnosis Date  . Diabetes mellitus without complication ([place])   . Hypertension   . Systolic murmur   . Vitamin D deficiency     Patient Active Problem List   Diagnosis Date Noted  . BMI 31.0-31.9,adult 06/10/2015  . Hyperlipidemia with target LDL less than 100 07/08/2013  . Type 1 diabetes mellitus ([place]) 11/10/2012  . Benign hypertensive heart disease without heart failure 11/10/2012  . S/P atrial septal defect closure 11/10/2012    Past Surgical History:  Procedure Laterality Date  . ABLATION    . ASD REPAIR    . CESAREAN SECTION    . COSMETIC SURGERY    . TUBAL LIGATION      OB History   No obstetric history on file.      Home Medications    Prior to Admission medications   Medication Sig Start Date End Date Taking? Authorizing Provider  aspirin EC 81 MG tablet Take 81 mg by mouth daily.    [provider]  CONTOUR NEXT TEST test strip USE AS DIRECTED 4 TO 5 TIMES DAILY AND AS NEEDED 02/27/19   Hassell Done, Mary-Margaret, FNP  cyclobenzaprine (FLEXERIL) 10 MG tablet Take 1 tablet (10 mg total) by mouth 3 (three) times daily as needed for muscle spasms. Patient not taking: Reported on 09/18/2019 06/11/19   Chevis Pretty, FNP  Insulin Infusion Pump  Supplies (MINIMED INFUSION SET-MMT 399) MISC Use with insulin pump. Change infusion set q 3 days. 02/04/13   Hassell Done, Mary-Margaret, FNP  insulin lispro (HUMALOG) 100 UNIT/ML injection Use up to 50 units daily in insulin pump 09/18/19   Hassell Done, Mary-Margaret, FNP  lisinopril-hydrochlorothiazide (ZESTORETIC) 20-12.5 MG tablet Take 1 tablet by mouth daily. 09/18/19   Hassell Done, Mary-Margaret, FNP  rosuvastatin (CRESTOR) 10 MG tablet Take 1 tablet (10 mg total) by mouth daily. 09/18/19   Chevis Pretty, FNP    Family History Family History  Problem Relation Age of Onset  . Heart disease Mother   . Hypertension Mother   . Diabetes Father   . Heart attack Father     Social History Social History   Tobacco Use  . Smoking status: Former Smoker    Packs/day: 0.30    Years: 10.00    Pack years: 3.00    Types: Cigarettes    Quit date: 01/01/2013    Years since quitting: 6.8  . Smokeless tobacco: Never Used  Substance Use Topics  . Alcohol use: No    Alcohol/week: 0.0 standard drinks  . Drug use: No     Allergies   Patient has no known allergies.   Review of Systems Review of Systems  Constitutional: Negative.  Respiratory: Negative.   Cardiovascular: Negative.   Musculoskeletal: Positive for joint swelling.       Left ankle pain  All other systems reviewed and are negative.    Physical Exam Triage Vital Signs ED Triage Vitals  Enc Vitals Group     BP 11/23/19 1753 125/77     Pulse Rate 11/23/19 1753 94     Resp 11/23/19 1753 18     Temp 11/23/19 1753 98.7 F (37.1 C)     Temp src --      SpO2 11/23/19 1753 95 %     Weight --      Height --      Head Circumference --      Peak Flow --      Pain Score 11/23/19 1748 5     Pain Loc --      Pain Edu? --      Excl. in GC? --    No data found.  Updated Vital Signs BP 125/77   Pulse 94   Temp 98.7 F (37.1 C)   Resp 18   SpO2 95%   Visual Acuity Right Eye Distance:   Left Eye Distance:   Bilateral  Distance:    Right Eye Near:   Left Eye Near:    Bilateral Near:     Physical Exam Vitals and nursing note reviewed.  Constitutional:      General: She is not in acute distress.    Appearance: Normal appearance. She is normal weight. She is not ill-appearing or toxic-appearing.  Cardiovascular:     Rate and Rhythm: Normal rate and regular rhythm.     Pulses: Normal pulses.     Heart sounds: Normal heart sounds. No murmur. No gallop.   Pulmonary:     Effort: Pulmonary effort is normal. No respiratory distress.     Breath sounds: Normal breath sounds. No stridor. No wheezing, rhonchi or rales.  Chest:     Chest wall: No tenderness.  Musculoskeletal:        General: Tenderness present. Normal range of motion.     Right ankle: Normal.     Left ankle: Swelling present. Tenderness present.     Comments: Patient is able to bear weight and ambulate with pain.  The right ankle is without obvious asymmetry or deformity when compared to the left.  Patient is able to flex/extend, invert/evert with pain.  No surface trauma, ecchymosis.  Good dorsalis pedis pulse.  Sensation intact  Neurological:     Mental Status: She is alert and oriented to person, place, and time.     Sensory: No sensory deficit.      UC Treatments / Results  Labs (all labs ordered are listed, but only abnormal results are displayed) Labs Reviewed - No data to display  EKG   Radiology DG Ankle Complete Left  Result Date: 11/23/2019 CLINICAL DATA:  Left ankle swelling after injury. Rolled ankle yesterday. Pain medially. EXAM: LEFT ANKLE COMPLETE - 3+ VIEW COMPARISON:  None. FINDINGS: There is no evidence of fracture, dislocation, or joint effusion. Alignment and ankle mortise are preserved. There is a plantar calcaneal spur. Mild generalized soft tissue edema. IMPRESSION: Soft tissue edema without acute fracture. Electronically Signed   By: Narda Rutherford M.D.   On: 11/23/2019 18:23    Procedures Procedures  (including critical care time)  Medications Ordered in UC Medications - No data to display  Initial Impression / Assessment and Plan / UC Course  I have  reviewed the triage vital signs and the nursing notes.  Pertinent labs & imaging results that were available during my care of the patient were reviewed by me and considered in my medical decision making (see chart for details).    Left ankle x-ray is negative for bony abnormality including fracture or dislocation.  I have reviewed the x-ray myself and the radiologist interpretation.  I am in agreement with the radiologist interpretation.  Patient is stable at discharge.  She was advised to take OTC Tylenol as needed for pain.  Follow RICE instructions as attached.  Ankle brace was applied in office.  Follow-up with primary care or return for worsening of symptoms.  Final Clinical Impressions(s) / UC Diagnoses   Final diagnoses:  Acute left ankle pain     Discharge Instructions     Continue to take OTC Tylenol for pain management Follow RICE instruction as attached Follow with primary care Return or go to ED for worsening symptoms    ED Prescriptions    None     PDMP not reviewed this encounter.   Durward Parcel, FNP 11/23/19 1829    Durward Parcel, FNP 11/23/19 [phone]

## 2019-11-23 NOTE — ED Triage Notes (Signed)
Pt presents with left ankle injury that happened last nigh, swelling noted

## 2019-11-26 ENCOUNTER — Ambulatory Visit: Payer: Self-pay | Admitting: Nurse Practitioner

## 2019-12-08 ENCOUNTER — Ambulatory Visit: Payer: 59 | Admitting: Nurse Practitioner

## 2019-12-09 MED FILL — CYCLOBENZAPRINE HCL 10 MG T: 10 | 10 days supply | Qty: 30 | Fill #1

## 2019-12-18 ENCOUNTER — Encounter: Payer: Self-pay | Admitting: Nurse Practitioner

## 2019-12-18 ENCOUNTER — Other Ambulatory Visit: Payer: Self-pay | Admitting: Nurse Practitioner

## 2019-12-18 ENCOUNTER — Other Ambulatory Visit: Payer: Self-pay

## 2019-12-18 ENCOUNTER — Ambulatory Visit: Payer: 59 | Admitting: Nurse Practitioner

## 2019-12-18 VITALS — BP 118/73 | HR 71 | Temp 97.6°F | Resp 20 | Ht 65.0 in | Wt 181.0 lb

## 2019-12-18 DIAGNOSIS — I119 Hypertensive heart disease without heart failure: Secondary | ICD-10-CM | POA: Diagnosis not present

## 2019-12-18 DIAGNOSIS — E785 Hyperlipidemia, unspecified: Secondary | ICD-10-CM

## 2019-12-18 DIAGNOSIS — E109 Type 1 diabetes mellitus without complications: Secondary | ICD-10-CM | POA: Diagnosis not present

## 2019-12-18 DIAGNOSIS — Z6831 Body mass index (BMI) 31.0-31.9, adult: Secondary | ICD-10-CM

## 2019-12-18 LAB — BAYER DCA HB A1C WAIVED: HB A1C (BAYER DCA - WAIVED): 7 % — ABNORMAL HIGH (ref <7.0)

## 2019-12-18 MED ORDER — LISINOPRIL-HYDROCHLOROTHIAZIDE 20-12.5 MG PO TABS: 1.0000 | ORAL_TABLET | Freq: Every day | ORAL | 1 refills | Status: DC

## 2019-12-18 MED ORDER — ROSUVASTATIN CALCIUM 10 MG PO TABS: 10.0000 mg | ORAL_TABLET | Freq: Every day | ORAL | 1 refills | Status: DC

## 2019-12-18 MED ORDER — INSULIN LISPRO 100 UNIT/ML ~~LOC~~ SOLN: SUBCUTANEOUS | 5 refills | Status: DC

## 2019-12-18 MED FILL — LISINOPRIL-HCTZ 20-12.5 MG: 20-12.5 | 90 days supply | Qty: 90 | Fill #0

## 2019-12-18 MED FILL — ROSUVASTATIN CALCIUM 10 MG: 10 | 90 days supply | Qty: 90 | Fill #0

## 2019-12-18 MED FILL — HumaLOG 100 UNIT/ML SOLN: 100 | 80 days supply | Qty: 40 | Fill #0

## 2019-12-18 NOTE — Patient Instructions (Signed)
Diabetes Mellitus and Foot Care Foot care is an important part of your health, especially when you have diabetes. Diabetes may cause you to have problems because of poor blood flow (circulation) to your feet and legs, which can cause your skin to:  Become thinner and drier.  Break more easily.  Heal more slowly.  Peel and crack. You may also have nerve damage (neuropathy) in your legs and feet, causing decreased feeling in them. This means that you may not notice minor injuries to your feet that could lead to more serious problems. Noticing and addressing any potential problems early is the best way to prevent future foot problems. How to care for your feet Foot hygiene  Wash your feet daily with warm water and mild soap. Do not use hot water. Then, pat your feet and the areas between your toes until they are completely dry. Do not soak your feet as this can dry your skin.  Trim your toenails straight across. Do not dig under them or around the cuticle. File the edges of your nails with an emery board or nail file.  Apply a moisturizing lotion or petroleum jelly to the skin on your feet and to dry, brittle toenails. Use lotion that does not contain alcohol and is unscented. Do not apply lotion between your toes. Shoes and socks  Wear clean socks or stockings every day. Make sure they are not too tight. Do not wear knee-high stockings since they may decrease blood flow to your legs.  Wear shoes that fit properly and have enough cushioning. Always look in your shoes before you put them on to be sure there are no objects inside.  To break in new shoes, wear them for just a few hours a day. This prevents injuries on your feet. Wounds, scrapes, corns, and calluses  Check your feet daily for blisters, cuts, bruises, sores, and redness. If you cannot see the bottom of your feet, use a mirror or ask someone for help.  Do not cut corns or calluses or try to remove them with medicine.  If you  find a minor scrape, cut, or break in the skin on your feet, keep it and the skin around it clean and dry. You may clean these areas with mild soap and water. Do not clean the area with peroxide, alcohol, or iodine.  If you have a wound, scrape, corn, or callus on your foot, look at it several times a day to make sure it is healing and not infected. Check for: ? Redness, swelling, or pain. ? Fluid or blood. ? Warmth. ? Pus or a bad smell. General instructions  Do not cross your legs. This may decrease blood flow to your feet.  Do not use heating pads or hot water bottles on your feet. They may burn your skin. If you have lost feeling in your feet or legs, you may not know this is happening until it is too late.  Protect your feet from hot and cold by wearing shoes, such as at the beach or on hot pavement.  Schedule a complete foot exam at least once a year (annually) or more often if you have foot problems. If you have foot problems, report any cuts, sores, or bruises to your health care provider immediately. Contact a health care provider if:  You have a medical condition that increases your risk of infection and you have any cuts, sores, or bruises on your feet.  You have an injury that is not   healing.  You have redness on your legs or feet.  You feel burning or tingling in your legs or feet.  You have pain or cramps in your legs and feet.  Your legs or feet are numb.  Your feet always feel cold.  You have pain around a toenail. Get help right away if:  You have a wound, scrape, corn, or callus on your foot and: ? You have pain, swelling, or redness that gets worse. ? You have fluid or blood coming from the wound, scrape, corn, or callus. ? Your wound, scrape, corn, or callus feels warm to the touch. ? You have pus or a bad smell coming from the wound, scrape, corn, or callus. ? You have a fever. ? You have a red line going up your leg. Summary  Check your feet every day  for cuts, sores, red spots, swelling, and blisters.  Moisturize feet and legs daily.  Wear shoes that fit properly and have enough cushioning.  If you have foot problems, report any cuts, sores, or bruises to your health care provider immediately.  Schedule a complete foot exam at least once a year (annually) or more often if you have foot problems. This information is not intended to replace advice given to you by your health care provider. Make sure you discuss any questions you have with your health care provider. Document Revised: 05/13/2019 Document Reviewed: 09/21/2016 Elsevier Patient Education  2020 [employer]  

## 2019-12-18 NOTE — Progress Notes (Signed)
Subjective:    Patient ID: Maria Conley, female    DOB: 28-Oct-1965, 54 y.o.   MRN: [id number]   Chief Complaint: medical management of chronic issues   HPI:  1. Benign hypertensive heart disease without heart failure No c/o chest pain, sob or headache. Does not check blood pressure at home BP Readings from Last 3 Encounters:  12/18/19 118/73  11/23/19 125/77  09/18/19 117/71     2. Hyperlipidemia with target LDL less than 100 Has had very poor appetite. Has not been able to do any exercise. Lab Results  Component Value Date   CHOL 169 09/18/2019   HDL 64 09/18/2019   LDLCALC 93 09/18/2019   TRIG 64 09/18/2019   CHOLHDL 2.6 09/18/2019     3. Type 1 diabetes mellitus without complication (HCC) Is on insulin pump. Blood sugar shave been below 130-150. denies nay low blood sugars. Lab Results  Component Value Date   HGBA1C 6.8 09/18/2019     4. BMI 31.0-31.9,adult Weight is unchanged Wt Readings from Last 3 Encounters:  12/18/19 181 lb (82.1 kg)  09/18/19 182 lb (82.6 kg)  06/11/19 190 lb (86.2 kg)   BMI Readings from Last 3 Encounters:  12/18/19 30.12 kg/m  09/18/19 30.29 kg/m  06/11/19 31.62 kg/m       Outpatient Encounter Medications as of 12/18/2019  Medication Sig  . aspirin EC 81 MG tablet Take 81 mg by mouth daily.  . CONTOUR NEXT TEST test strip USE AS DIRECTED 4 TO 5 TIMES DAILY AND AS NEEDED  . cyclobenzaprine (FLEXERIL) 10 MG tablet Take 1 tablet (10 mg total) by mouth 3 (three) times daily as needed for muscle spasms. (Patient not taking: Reported on 09/18/2019)  . Insulin Infusion Pump Supplies (MINIMED INFUSION SET-MMT 399) MISC Use with insulin pump. Change infusion set q 3 days.  . insulin lispro (HUMALOG) 100 UNIT/ML injection Use up to 50 units daily in insulin pump  . lisinopril-hydrochlorothiazide (ZESTORETIC) 20-12.5 MG tablet Take 1 tablet by mouth daily.  . rosuvastatin (CRESTOR) 10 MG tablet Take 1 tablet (10 mg total) by  mouth daily.     Past Surgical History:  Procedure Laterality Date  . ABLATION    . ASD REPAIR    . CESAREAN SECTION    . COSMETIC SURGERY    . TUBAL LIGATION      Family History  Problem Relation Age of Onset  . Heart disease Mother   . Hypertension Mother   . Diabetes Father   . Heart attack Father     New complaints: None today  Social history: Lives with husband and son. Works at [employer]  Controlled substance contract: n/a    Review of Systems  Constitutional: Negative for diaphoresis.  Eyes: Negative for pain.  Respiratory: Negative for shortness of breath.   Cardiovascular: Negative for chest pain, palpitations and leg swelling.  Gastrointestinal: Negative for abdominal pain.  Endocrine: Negative for polydipsia.  Skin: Negative for rash.  Neurological: Negative for dizziness, weakness and headaches.  Hematological: Does not bruise/bleed easily.  All other systems reviewed and are negative.      Objective:   Physical Exam Vitals and nursing note reviewed.  Constitutional:      General: She is not in acute distress.    Appearance: Normal appearance. She is well-developed.  HENT:     Head: Normocephalic.     Nose: Nose normal.  Eyes:     Pupils: Pupils are equal, round, and reactive  to light.  Neck:     Vascular: No carotid bruit or JVD.  Cardiovascular:     Rate and Rhythm: Normal rate and regular rhythm.     Heart sounds: Normal heart sounds.  Pulmonary:     Effort: Pulmonary effort is normal. No respiratory distress.     Breath sounds: Normal breath sounds. No wheezing or rales.  Chest:     Chest wall: No tenderness.  Abdominal:     General: Bowel sounds are normal. There is no distension or abdominal bruit.     Palpations: Abdomen is soft. There is no hepatomegaly, splenomegaly, mass or pulsatile mass.     Tenderness: There is no abdominal tenderness.  Musculoskeletal:        General: Normal range of motion.     Cervical back:  Normal range of motion and neck supple.  Lymphadenopathy:     Cervical: No cervical adenopathy.  Skin:    General: Skin is warm and dry.  Neurological:     Mental Status: She is alert and oriented to person, place, and time.     Deep Tendon Reflexes: Reflexes are normal and symmetric.  Psychiatric:        Behavior: Behavior normal.        Thought Content: Thought content normal.        Judgment: Judgment normal.    BP 118/73   Pulse 71   Temp 97.6 F (36.4 C) (Temporal)   Resp 20   Ht '5\' 5"'  (1.651 m)   Wt 181 lb (82.1 kg)   SpO2 98%   BMI 30.12 kg/m   HGBa1c 7.0%       Assessment & Plan:  PRESTYN MAHN comes in today with chief complaint of Medical Management of Chronic Issues   Diagnosis and orders addressed:  1. Benign hypertensive heart disease without heart failure Low sodium diet - CBC with Differential/Platelet - CMP14+EGFR - lisinopril-hydrochlorothiazide (ZESTORETIC) 20-12.5 MG tablet; Take 1 tablet by mouth daily.  Dispense: 90 tablet; Refill: 1  2. Hyperlipidemia with target LDL less than 100 Low fat diet - Lipid panel - rosuvastatin (CRESTOR) 10 MG tablet; Take 1 tablet (10 mg total) by mouth daily.  Dispense: 90 tablet; Refill: 1  3. Type 1 diabetes mellitus without complication (HCC) contnue to watch carbs in diet - Bayer DCA Hb A1c Waived - insulin lispro (HUMALOG) 100 UNIT/ML injection; Use up to 50 units daily in insulin pump  Dispense: 60 mL; Refill: 5  4. BMI 31.0-31.9,adult Discussed diet and exercise for person with BMI >25 Will recheck weight in 3-6 months   Labs pending Health Maintenance reviewed Diet and exercise encouraged  Follow up plan: 3 months   Mary-Margaret Hassell Done, FNP

## 2019-12-19 LAB — CBC WITH DIFFERENTIAL/PLATELET
Basophils Absolute: 0.1 10*3/uL (ref 0.0–0.2)
Basos: 1 %
EOS (ABSOLUTE): 0.3 10*3/uL (ref 0.0–0.4)
Eos: 5 %
Hematocrit: 44.1 % (ref 34.0–46.6)
Hemoglobin: 14.7 g/dL (ref 11.1–15.9)
Immature Grans (Abs): 0 10*3/uL (ref 0.0–0.1)
Immature Granulocytes: 0 %
Lymphocytes Absolute: 1.8 10*3/uL (ref 0.7–3.1)
Lymphs: 25 %
MCH: 29.2 pg (ref 26.6–33.0)
MCHC: 33.3 g/dL (ref 31.5–35.7)
MCV: 88 fL (ref 79–97)
Monocytes Absolute: 0.6 10*3/uL (ref 0.1–0.9)
Monocytes: 9 %
Neutrophils Absolute: 4.3 10*3/uL (ref 1.4–7.0)
Neutrophils: 60 %
Platelets: 211 10*3/uL (ref 150–450)
RBC: 5.04 x10E6/uL (ref 3.77–5.28)
RDW: 12.6 % (ref 11.7–15.4)
WBC: 7.1 10*3/uL (ref 3.4–10.8)

## 2019-12-19 LAB — CMP14+EGFR
ALT: 13 IU/L (ref 0–32)
AST: 11 IU/L (ref 0–40)
Albumin/Globulin Ratio: 2.4 — ABNORMAL HIGH (ref 1.2–2.2)
Albumin: 4.1 g/dL (ref 3.8–4.9)
Alkaline Phosphatase: 74 IU/L (ref 39–117)
BUN/Creatinine Ratio: 17 (ref 9–23)
BUN: 14 mg/dL (ref 6–24)
Bilirubin Total: 0.5 mg/dL (ref 0.0–1.2)
CO2: 25 mmol/L (ref 20–29)
Calcium: 8.9 mg/dL (ref 8.7–10.2)
Chloride: 100 mmol/L (ref 96–106)
Creatinine, Ser: 0.83 mg/dL (ref 0.57–1.00)
GFR calc Af Amer: 92 mL/min/{1.73_m2} (ref >59)
GFR calc non Af Amer: 80 mL/min/{1.73_m2} (ref >59)
Globulin, Total: 1.7 g/dL (ref 1.5–4.5)
Glucose: 181 mg/dL — ABNORMAL HIGH (ref 65–99)
Potassium: 4 mmol/L (ref 3.5–5.2)
Sodium: 138 mmol/L (ref 134–144)
Total Protein: 5.8 g/dL — ABNORMAL LOW (ref 6.0–8.5)

## 2019-12-19 LAB — LIPID PANEL
Chol/HDL Ratio: 2.4 ratio (ref 0.0–4.4)
Cholesterol, Total: 141 mg/dL (ref 100–199)
HDL: 60 mg/dL (ref >39)
LDL Chol Calc (NIH): 68 mg/dL (ref 0–99)
Triglycerides: 62 mg/dL (ref 0–149)
VLDL Cholesterol Cal: 13 mg/dL (ref 5–40)

## 2020-02-02 DIAGNOSIS — E108 Type 1 diabetes mellitus with unspecified complications: Secondary | ICD-10-CM | POA: Diagnosis not present

## 2020-02-02 DIAGNOSIS — E109 Type 1 diabetes mellitus without complications: Secondary | ICD-10-CM | POA: Diagnosis not present

## 2020-03-10 ENCOUNTER — Other Ambulatory Visit: Payer: Self-pay

## 2020-03-10 ENCOUNTER — Encounter: Payer: Self-pay | Admitting: Nurse Practitioner

## 2020-03-10 ENCOUNTER — Other Ambulatory Visit: Payer: Self-pay | Admitting: Nurse Practitioner

## 2020-03-10 ENCOUNTER — Ambulatory Visit (INDEPENDENT_AMBULATORY_CARE_PROVIDER_SITE_OTHER): Payer: 59 | Admitting: Nurse Practitioner

## 2020-03-10 VITALS — BP 121/75 | HR 71 | Temp 97.7°F | Resp 20 | Ht 65.0 in | Wt 180.0 lb

## 2020-03-10 DIAGNOSIS — E785 Hyperlipidemia, unspecified: Secondary | ICD-10-CM | POA: Diagnosis not present

## 2020-03-10 DIAGNOSIS — I119 Hypertensive heart disease without heart failure: Secondary | ICD-10-CM

## 2020-03-10 DIAGNOSIS — E109 Type 1 diabetes mellitus without complications: Secondary | ICD-10-CM | POA: Diagnosis not present

## 2020-03-10 DIAGNOSIS — Z6831 Body mass index (BMI) 31.0-31.9, adult: Secondary | ICD-10-CM

## 2020-03-10 LAB — BAYER DCA HB A1C WAIVED: HB A1C (BAYER DCA - WAIVED): 6.7 % (ref <7.0)

## 2020-03-10 MED ORDER — LISINOPRIL-HYDROCHLOROTHIAZIDE 20-12.5 MG PO TABS: 1.0000 | ORAL_TABLET | Freq: Every day | ORAL | 1 refills | Status: DC

## 2020-03-10 MED ORDER — ROSUVASTATIN CALCIUM 10 MG PO TABS: 10.0000 mg | ORAL_TABLET | Freq: Every day | ORAL | 1 refills | Status: DC

## 2020-03-10 MED ORDER — INSULIN LISPRO 100 UNIT/ML ~~LOC~~ SOLN: SUBCUTANEOUS | 5 refills | Status: DC

## 2020-03-10 MED FILL — LISINOPRIL-HCTZ 20-12.5 MG: 20-12.5 | 90 days supply | Qty: 90 | Fill #0

## 2020-03-10 MED FILL — ROSUVASTATIN CALCIUM 10 MG: 10 | 90 days supply | Qty: 90 | Fill #0

## 2020-03-10 MED FILL — HumaLOG 100 UNIT/ML SOLN: 100 | 80 days supply | Qty: 40 | Fill #0

## 2020-03-10 NOTE — Patient Instructions (Signed)
Diabetes Mellitus and Foot Care Foot care is an important part of your health, especially when you have diabetes. Diabetes may cause you to have problems because of poor blood flow (circulation) to your feet and legs, which can cause your skin to:  Become thinner and drier.  Break more easily.  Heal more slowly.  Peel and crack. You may also have nerve damage (neuropathy) in your legs and feet, causing decreased feeling in them. This means that you may not notice minor injuries to your feet that could lead to more serious problems. Noticing and addressing any potential problems early is the best way to prevent future foot problems. How to care for your feet Foot hygiene  Wash your feet daily with warm water and mild soap. Do not use hot water. Then, pat your feet and the areas between your toes until they are completely dry. Do not soak your feet as this can dry your skin.  Trim your toenails straight across. Do not dig under them or around the cuticle. File the edges of your nails with an emery board or nail file.  Apply a moisturizing lotion or petroleum jelly to the skin on your feet and to dry, brittle toenails. Use lotion that does not contain alcohol and is unscented. Do not apply lotion between your toes. Shoes and socks  Wear clean socks or stockings every day. Make sure they are not too tight. Do not wear knee-high stockings since they may decrease blood flow to your legs.  Wear shoes that fit properly and have enough cushioning. Always look in your shoes before you put them on to be sure there are no objects inside.  To break in new shoes, wear them for just a few hours a day. This prevents injuries on your feet. Wounds, scrapes, corns, and calluses  Check your feet daily for blisters, cuts, bruises, sores, and redness. If you cannot see the bottom of your feet, use a mirror or ask someone for help.  Do not cut corns or calluses or try to remove them with medicine.  If you  find a minor scrape, cut, or break in the skin on your feet, keep it and the skin around it clean and dry. You may clean these areas with mild soap and water. Do not clean the area with peroxide, alcohol, or iodine.  If you have a wound, scrape, corn, or callus on your foot, look at it several times a day to make sure it is healing and not infected. Check for: ? Redness, swelling, or pain. ? Fluid or blood. ? Warmth. ? Pus or a bad smell. General instructions  Do not cross your legs. This may decrease blood flow to your feet.  Do not use heating pads or hot water bottles on your feet. They may burn your skin. If you have lost feeling in your feet or legs, you may not know this is happening until it is too late.  Protect your feet from hot and cold by wearing shoes, such as at the beach or on hot pavement.  Schedule a complete foot exam at least once a year (annually) or more often if you have foot problems. If you have foot problems, report any cuts, sores, or bruises to your health care provider immediately. Contact a health care provider if:  You have a medical condition that increases your risk of infection and you have any cuts, sores, or bruises on your feet.  You have an injury that is not   healing.  You have redness on your legs or feet.  You feel burning or tingling in your legs or feet.  You have pain or cramps in your legs and feet.  Your legs or feet are numb.  Your feet always feel cold.  You have pain around a toenail. Get help right away if:  You have a wound, scrape, corn, or callus on your foot and: ? You have pain, swelling, or redness that gets worse. ? You have fluid or blood coming from the wound, scrape, corn, or callus. ? Your wound, scrape, corn, or callus feels warm to the touch. ? You have pus or a bad smell coming from the wound, scrape, corn, or callus. ? You have a fever. ? You have a red line going up your leg. Summary  Check your feet every day  for cuts, sores, red spots, swelling, and blisters.  Moisturize feet and legs daily.  Wear shoes that fit properly and have enough cushioning.  If you have foot problems, report any cuts, sores, or bruises to your health care provider immediately.  Schedule a complete foot exam at least once a year (annually) or more often if you have foot problems. This information is not intended to replace advice given to you by your health care provider. Make sure you discuss any questions you have with your health care provider. Document Revised: 05/13/2019 Document Reviewed: 09/21/2016 Elsevier Patient Education  2020 [employer]  

## 2020-03-10 NOTE — Progress Notes (Signed)
Subjective:    Patient ID: Maria Conley, female    DOB: 1966/09/01, 54 y.o.   MRN: [id number]   Chief Complaint: Medical Management of Chronic Issues    HPI:  1. Hyperlipidemia with target LDL less than 100 Patient does wathc diet and stays very active. Lab Results  Component Value Date   CHOL 141 12/18/2019   HDL 60 12/18/2019   LDLCALC 68 12/18/2019   TRIG 62 12/18/2019   CHOLHDL 2.4 12/18/2019     2. Type 1 diabetes mellitus without complication (HCC) Blood sugars fluctuate form 140-220. She says she needs to change the site of her pump and that usually helps. Lab Results  Component Value Date   HGBA1C 7.0 (H) 12/18/2019     3. Benign hypertensive heart disease without heart failure No c/o chest pain, sob or headache. Does not check blood pressure at home. BP Readings from Last 3 Encounters:  03/10/20 121/75  12/18/19 118/73  11/23/19 125/77     4. BMI 31.0-31.9,adult No recent weight changes Wt Readings from Last 3 Encounters:  03/10/20 180 lb (81.6 kg)  12/18/19 181 lb (82.1 kg)  09/18/19 182 lb (82.6 kg)   BMI Readings from Last 3 Encounters:  03/10/20 29.95 kg/m  12/18/19 30.12 kg/m  09/18/19 30.29 kg/m         Outpatient Encounter Medications as of 03/10/2020  Medication Sig  . aspirin EC 81 MG tablet Take 81 mg by mouth daily.  . CONTOUR NEXT TEST test strip USE AS DIRECTED 4 TO 5 TIMES DAILY AND AS NEEDED  . cyclobenzaprine (FLEXERIL) 10 MG tablet Take 1 tablet (10 mg total) by mouth 3 (three) times daily as needed for muscle spasms.  . Insulin Infusion Pump Supplies (MINIMED INFUSION SET-MMT 399) MISC Use with insulin pump. Change infusion set q 3 days.  . insulin lispro (HUMALOG) 100 UNIT/ML injection Use up to 50 units daily in insulin pump  . lisinopril-hydrochlorothiazide (ZESTORETIC) 20-12.5 MG tablet Take 1 tablet by mouth daily.  . rosuvastatin (CRESTOR) 10 MG tablet Take 1 tablet (10 mg total) by mouth daily.     Past  Surgical History:  Procedure Laterality Date  . ABLATION    . ASD REPAIR    . CESAREAN SECTION    . COSMETIC SURGERY    . TUBAL LIGATION      Family History  Problem Relation Age of Onset  . Heart disease Mother   . Hypertension Mother   . Diabetes Father   . Heart attack Father     New complaints: None today  Social history: Lives with husband. Has a child in rehab and is doing better. 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  Controlled substance contract: n/a    Review of Systems  Constitutional: Negative for diaphoresis.  Eyes: Negative for pain.  Respiratory: Negative for shortness of breath.   Cardiovascular: Negative for chest pain, palpitations and leg swelling.  Gastrointestinal: Negative for abdominal pain.  Endocrine: Negative for polydipsia.  Skin: Negative for rash.  Neurological: Negative for dizziness, weakness and headaches.  Hematological: Does not bruise/bleed easily.  All other systems reviewed and are negative.      Objective:   Physical Exam Vitals and nursing note reviewed.  Constitutional:      General: She is not in acute distress.    Appearance: Normal appearance. She is well-developed.  HENT:     Head: Normocephalic.     Nose: Nose normal.  Eyes:  Pupils: Pupils are equal, round, and reactive to light.  Neck:     Vascular: No carotid bruit or JVD.  Cardiovascular:     Rate and Rhythm: Normal rate and regular rhythm.     Heart sounds: Normal heart sounds.  Pulmonary:     Effort: Pulmonary effort is normal. No respiratory distress.     Breath sounds: Normal breath sounds. No wheezing or rales.  Chest:     Chest wall: No tenderness.  Abdominal:     General: Bowel sounds are normal. There is no distension or abdominal bruit.     Palpations: Abdomen is soft. There is no hepatomegaly, splenomegaly, mass or pulsatile mass.     Tenderness: There is no abdominal tenderness.  Musculoskeletal:        General: Normal range of motion.      Cervical back: Normal range of motion and neck supple.  Lymphadenopathy:     Cervical: No cervical adenopathy.  Skin:    General: Skin is warm and dry.  Neurological:     Mental Status: She is alert and oriented to person, place, and time.     Deep Tendon Reflexes: Reflexes are normal and symmetric.  Psychiatric:        Behavior: Behavior normal.        Thought Content: Thought content normal.        Judgment: Judgment normal.     BP 121/75   Pulse 71   Temp 97.7 F (36.5 C) (Temporal)   Resp 20   Ht '5\' 5"'  (1.651 m)   Wt 180 lb (81.6 kg)   SpO2 97%   BMI 29.95 kg/m   Hgba1c 6.7%      Assessment & Plan:  DIANY FORMOSA comes in today with chief complaint of Medical Management of Chronic Issues   Diagnosis and orders addressed:  1. Hyperlipidemia with target LDL less than 100 Low fat diet - Lipid panel - rosuvastatin (CRESTOR) 10 MG tablet; Take 1 tablet (10 mg total) by mouth daily.  Dispense: 90 tablet; Refill: 1  2. Type 1 diabetes mellitus without complication (HCC) Continue to watch carbs in diet - Bayer DCA Hb A1c Waived - insulin lispro (HUMALOG) 100 UNIT/ML injection; Use up to 50 units daily in insulin pump  Dispense: 60 mL; Refill: 5  3. Benign hypertensive heart disease without heart failure Low sodium diet - CBC with Differential/Platelet - CMP14+EGFR - lisinopril-hydrochlorothiazide (ZESTORETIC) 20-12.5 MG tablet; Take 1 tablet by mouth daily.  Dispense: 90 tablet; Refill: 1  4. BMI 31.0-31.9,adult Discussed diet and exercise for person with BMI >25 Will recheck weight in 3-6 months   Labs pending Health Maintenance reviewed- will make appointment with [facility] for papa and mammogram Diet and exercise encouraged  Follow up plan: 3 months   [place], FNP

## 2020-03-11 LAB — CMP14+EGFR
ALT: 13 IU/L (ref 0–32)
AST: 13 IU/L (ref 0–40)
Albumin/Globulin Ratio: 2.2 (ref 1.2–2.2)
Albumin: 4.3 g/dL (ref 3.8–4.9)
Alkaline Phosphatase: 83 IU/L (ref 48–121)
BUN/Creatinine Ratio: 19 (ref 9–23)
BUN: 16 mg/dL (ref 6–24)
Bilirubin Total: 0.3 mg/dL (ref 0.0–1.2)
CO2: 24 mmol/L (ref 20–29)
Calcium: 9.3 mg/dL (ref 8.7–10.2)
Chloride: 99 mmol/L (ref 96–106)
Creatinine, Ser: 0.85 mg/dL (ref 0.57–1.00)
GFR calc Af Amer: 89 mL/min/{1.73_m2} (ref >59)
GFR calc non Af Amer: 77 mL/min/{1.73_m2} (ref >59)
Globulin, Total: 2 g/dL (ref 1.5–4.5)
Glucose: 178 mg/dL — ABNORMAL HIGH (ref 65–99)
Potassium: 3.9 mmol/L (ref 3.5–5.2)
Sodium: 137 mmol/L (ref 134–144)
Total Protein: 6.3 g/dL (ref 6.0–8.5)

## 2020-03-11 LAB — LIPID PANEL
Chol/HDL Ratio: 2.9 ratio (ref 0.0–4.4)
Cholesterol, Total: 178 mg/dL (ref 100–199)
HDL: 61 mg/dL (ref >39)
LDL Chol Calc (NIH): 103 mg/dL — ABNORMAL HIGH (ref 0–99)
Triglycerides: 76 mg/dL (ref 0–149)
VLDL Cholesterol Cal: 14 mg/dL (ref 5–40)

## 2020-03-11 LAB — CBC WITH DIFFERENTIAL/PLATELET
Basophils Absolute: 0.1 10*3/uL (ref 0.0–0.2)
Basos: 1 %
EOS (ABSOLUTE): 0.4 10*3/uL (ref 0.0–0.4)
Eos: 6 %
Hematocrit: 44.1 % (ref 34.0–46.6)
Hemoglobin: 15.2 g/dL (ref 11.1–15.9)
Immature Grans (Abs): 0 10*3/uL (ref 0.0–0.1)
Immature Granulocytes: 0 %
Lymphocytes Absolute: 1.7 10*3/uL (ref 0.7–3.1)
Lymphs: 25 %
MCH: 29.9 pg (ref 26.6–33.0)
MCHC: 34.5 g/dL (ref 31.5–35.7)
MCV: 87 fL (ref 79–97)
Monocytes Absolute: 0.5 10*3/uL (ref 0.1–0.9)
Monocytes: 7 %
Neutrophils Absolute: 4.3 10*3/uL (ref 1.4–7.0)
Neutrophils: 61 %
Platelets: 210 10*3/uL (ref 150–450)
RBC: 5.09 x10E6/uL (ref 3.77–5.28)
RDW: 12.8 % (ref 11.7–15.4)
WBC: 7.1 10*3/uL (ref 3.4–10.8)

## 2020-03-31 ENCOUNTER — Encounter: Payer: Self-pay | Admitting: Nurse Practitioner

## 2020-04-26 ENCOUNTER — Telehealth: Payer: Self-pay | Admitting: Nurse Practitioner

## 2020-04-26 NOTE — Telephone Encounter (Signed)
Tetanus booster she had she be fine. Just l]keep wound clean. Watch for infection and call if she needs me.

## 2020-04-26 NOTE — Telephone Encounter (Signed)
Pt states she scratched her leg up pretty bad yesterday on a rusty fence trying to get the pigs back in . She did have a TDAP in 2019 but health at work suggested she get a booster. Advised pt she should watch for signs and symptoms of infection but she should be fine with the tetanus as we usually don't recommend the booster till closer to the 5 year mark with injury. Just wanted to double check with you to see if you agree.

## 2020-04-26 NOTE — Telephone Encounter (Signed)
Patient aware.

## 2020-05-10 ENCOUNTER — Other Ambulatory Visit: Payer: Self-pay | Admitting: Nurse Practitioner

## 2020-05-10 DIAGNOSIS — M545 Low back pain, unspecified: Secondary | ICD-10-CM

## 2020-05-11 ENCOUNTER — Other Ambulatory Visit: Payer: Self-pay | Admitting: Nurse Practitioner

## 2020-05-11 MED FILL — CYCLOBENZAPRINE HCL 10 MG T: 10 | 10 days supply | Qty: 30 | Fill #0

## 2020-05-12 DIAGNOSIS — E109 Type 1 diabetes mellitus without complications: Secondary | ICD-10-CM | POA: Diagnosis not present

## 2020-05-12 DIAGNOSIS — E108 Type 1 diabetes mellitus with unspecified complications: Secondary | ICD-10-CM | POA: Diagnosis not present

## 2020-06-24 ENCOUNTER — Ambulatory Visit: Payer: 59 | Admitting: Nurse Practitioner

## 2020-06-24 ENCOUNTER — Other Ambulatory Visit: Payer: Self-pay

## 2020-06-24 ENCOUNTER — Encounter: Payer: Self-pay | Admitting: Nurse Practitioner

## 2020-06-24 VITALS — BP 119/76 | HR 80 | Temp 96.2°F | Resp 20 | Ht 65.0 in | Wt 189.0 lb

## 2020-06-24 DIAGNOSIS — Z6831 Body mass index (BMI) 31.0-31.9, adult: Secondary | ICD-10-CM | POA: Diagnosis not present

## 2020-06-24 DIAGNOSIS — E785 Hyperlipidemia, unspecified: Secondary | ICD-10-CM

## 2020-06-24 DIAGNOSIS — E109 Type 1 diabetes mellitus without complications: Secondary | ICD-10-CM | POA: Diagnosis not present

## 2020-06-24 DIAGNOSIS — I119 Hypertensive heart disease without heart failure: Secondary | ICD-10-CM

## 2020-06-24 LAB — CBC WITH DIFFERENTIAL/PLATELET
Basophils Absolute: 0.1 10*3/uL (ref 0.0–0.2)
Basos: 2 %
EOS (ABSOLUTE): 0.5 10*3/uL — ABNORMAL HIGH (ref 0.0–0.4)
Eos: 7 %
Hematocrit: 45.6 % (ref 34.0–46.6)
Hemoglobin: 15.1 g/dL (ref 11.1–15.9)
Immature Grans (Abs): 0 10*3/uL (ref 0.0–0.1)
Immature Granulocytes: 0 %
Lymphocytes Absolute: 2.6 10*3/uL (ref 0.7–3.1)
Lymphs: 38 %
MCH: 29.2 pg (ref 26.6–33.0)
MCHC: 33.1 g/dL (ref 31.5–35.7)
MCV: 88 fL (ref 79–97)
Monocytes Absolute: 0.5 10*3/uL (ref 0.1–0.9)
Monocytes: 8 %
Neutrophils Absolute: 3.2 10*3/uL (ref 1.4–7.0)
Neutrophils: 45 %
Platelets: 272 10*3/uL (ref 150–450)
RBC: 5.17 x10E6/uL (ref 3.77–5.28)
RDW: 12.5 % (ref 11.7–15.4)
WBC: 6.9 10*3/uL (ref 3.4–10.8)

## 2020-06-24 LAB — LIPID PANEL
Chol/HDL Ratio: 3.5 ratio (ref 0.0–4.4)
Cholesterol, Total: 184 mg/dL (ref 100–199)
HDL: 53 mg/dL (ref >39)
LDL Chol Calc (NIH): 115 mg/dL — ABNORMAL HIGH (ref 0–99)
Triglycerides: 88 mg/dL (ref 0–149)
VLDL Cholesterol Cal: 16 mg/dL (ref 5–40)

## 2020-06-24 LAB — CMP14+EGFR
ALT: 18 IU/L (ref 0–32)
AST: 17 IU/L (ref 0–40)
Albumin/Globulin Ratio: 1.9 (ref 1.2–2.2)
Albumin: 4.2 g/dL (ref 3.8–4.9)
Alkaline Phosphatase: 85 IU/L (ref 44–121)
BUN/Creatinine Ratio: 13 (ref 9–23)
BUN: 12 mg/dL (ref 6–24)
Bilirubin Total: 0.3 mg/dL (ref 0.0–1.2)
CO2: 23 mmol/L (ref 20–29)
Calcium: 9.5 mg/dL (ref 8.7–10.2)
Chloride: 101 mmol/L (ref 96–106)
Creatinine, Ser: 0.89 mg/dL (ref 0.57–1.00)
GFR calc Af Amer: 84 mL/min/{1.73_m2} (ref >59)
GFR calc non Af Amer: 73 mL/min/{1.73_m2} (ref >59)
Globulin, Total: 2.2 g/dL (ref 1.5–4.5)
Glucose: 98 mg/dL (ref 65–99)
Potassium: 3.8 mmol/L (ref 3.5–5.2)
Sodium: 139 mmol/L (ref 134–144)
Total Protein: 6.4 g/dL (ref 6.0–8.5)

## 2020-06-24 LAB — BAYER DCA HB A1C WAIVED: HB A1C (BAYER DCA - WAIVED): 7 % — ABNORMAL HIGH (ref <7.0)

## 2020-06-24 NOTE — Patient Instructions (Signed)
Carbohydrate Counting for Diabetes Mellitus, Adult  Carbohydrate counting is a method of keeping track of how many carbohydrates you eat. Eating carbohydrates naturally increases the amount of sugar (glucose) in the blood. Counting how many carbohydrates you eat helps keep your blood glucose within normal limits, which helps you manage your diabetes (diabetes mellitus). It is important to know how many carbohydrates you can safely have in each meal. This is different for every person. A diet and nutrition specialist (registered dietitian) can help you make a meal plan and calculate how many carbohydrates you should have at each meal and snack. Carbohydrates are found in the following foods:  Grains, such as breads and cereals.  Dried beans and soy products.  Starchy vegetables, such as potatoes, peas, and corn.  Fruit and fruit juices.  Milk and yogurt.  Sweets and snack foods, such as cake, cookies, candy, chips, and soft drinks. How do I count carbohydrates? There are two ways to count carbohydrates in food. You can use either of the methods or a combination of both. Reading "Nutrition Facts" on packaged food The "Nutrition Facts" list is included on the labels of almost all packaged foods and beverages in the [place] It includes:  The serving size.  Information about nutrients in each serving, including the grams (g) of carbohydrate per serving. To use the "Nutrition Facts":  Decide how many servings you will have.  Multiply the number of servings by the number of carbohydrates per serving.  The resulting number is the total amount of carbohydrates that you will be having. Learning standard serving sizes of other foods When you eat carbohydrate foods that are not packaged or do not include "Nutrition Facts" on the label, you need to measure the servings in order to count the amount of carbohydrates:  Measure the foods that you will eat with a food scale or measuring cup, if  needed.  Decide how many standard-size servings you will eat.  Multiply the number of servings by 15. Most carbohydrate-rich foods have about 15 g of carbohydrates per serving. ? For example, if you eat 8 oz (170 g) of strawberries, you will have eaten 2 servings and 30 g of carbohydrates (2 servings x 15 g = 30 g).  For foods that have more than one food mixed, such as soups and casseroles, you must count the carbohydrates in each food that is included. The following list contains standard serving sizes of common carbohydrate-rich foods. Each of these servings has about 15 g of carbohydrates:   hamburger bun or  English muffin.   oz (15 mL) syrup.   oz (14 g) jelly.  1 slice of bread.  1 six-inch tortilla.  3 oz (85 g) cooked rice or pasta.  4 oz (113 g) cooked dried beans.  4 oz (113 g) starchy vegetable, such as peas, corn, or potatoes.  4 oz (113 g) hot cereal.  4 oz (113 g) mashed potatoes or  of a large baked potato.  4 oz (113 g) canned or frozen fruit.  4 oz (120 mL) fruit juice.  4-6 crackers.  6 chicken nuggets.  6 oz (170 g) unsweetened dry cereal.  6 oz (170 g) plain fat-free yogurt or yogurt sweetened with artificial sweeteners.  8 oz (240 mL) milk.  8 oz (170 g) fresh fruit or one small piece of fruit.  24 oz (680 g) popped popcorn. Example of carbohydrate counting Sample meal  3 oz (85 g) chicken breast.  6 oz (170 g)   brown rice.  4 oz (113 g) corn.  8 oz (240 mL) milk.  8 oz (170 g) strawberries with sugar-free whipped topping. Carbohydrate calculation 1. Identify the foods that contain carbohydrates: ? Rice. ? Corn. ? Milk. ? Strawberries. 2. Calculate how many servings you have of each food: ? 2 servings rice. ? 1 serving corn. ? 1 serving milk. ? 1 serving strawberries. 3. Multiply each number of servings by 15 g: ? 2 servings rice x 15 g = 30 g. ? 1 serving corn x 15 g = 15 g. ? 1 serving milk x 15 g = 15 g. ? 1  serving strawberries x 15 g = 15 g. 4. Add together all of the amounts to find the total grams of carbohydrates eaten: ? 30 g + 15 g + 15 g + 15 g = 75 g of carbohydrates total. Summary  Carbohydrate counting is a method of keeping track of how many carbohydrates you eat.  Eating carbohydrates naturally increases the amount of sugar (glucose) in the blood.  Counting how many carbohydrates you eat helps keep your blood glucose within normal limits, which helps you manage your diabetes.  A diet and nutrition specialist (registered dietitian) can help you make a meal plan and calculate how many carbohydrates you should have at each meal and snack. This information is not intended to replace advice given to you by your health care provider. Make sure you discuss any questions you have with your health care provider. Document Revised: 03/14/2017 Document Reviewed: 02/01/2016 Elsevier Patient Education  2020 Elsevier Inc.  

## 2020-06-24 NOTE — Progress Notes (Signed)
 Subjective:    Patient ID: Maria Conley, female    DOB: 12/24/1965, 53 y.o.   MRN: [id number]   Chief Complaint: medical management of chronic issues     HPI:  1. Benign hypertensive heart disease without heart failure BP Readings from Last 3 Encounters:  06/24/20 119/76  03/10/20 121/75  12/18/19 118/73  Checks blood pressure regularly and takes medication as prescribed. No chest pain, SOB, headaches, or swelling.    2. Hyperlipidemia with target LDL less than 100 Lab Results  Component Value Date   CHOL 178 03/10/2020   HDL 61 03/10/2020   LDLCALC 103 (H) 03/10/2020   TRIG 76 03/10/2020   CHOLHDL 2.9 03/10/2020  Takes medication as prescribed. Avoids fried and fatty foods.    3. Type 1 diabetes mellitus without complication (HCC) Lab Results  Component Value Date   HGBA1C 6.7 03/10/2020  Has sensor to check blood glucose and has pump. Checks CBG regularly. Avoids sugar and carbs. HA1C is 7.0% today.   4. BMI 31.0-31.9,adult Wt Readings from Last 3 Encounters:  06/24/20 189 lb (85.7 kg)  03/10/20 180 lb (81.6 kg)  12/18/19 181 lb (82.1 kg)   BMI Readings from Last 3 Encounters:  06/24/20 31.45 kg/m  03/10/20 29.95 kg/m  12/18/19 30.12 kg/m   Walks ~1 mile a day.     Outpatient Encounter Medications as of 06/24/2020  Medication Sig  . aspirin EC 81 MG tablet Take 81 mg by mouth daily.  . CONTOUR NEXT TEST test strip USE AS DIRECTED 4 TO 5 TIMES DAILY AND AS NEEDED  . cyclobenzaprine (FLEXERIL) 10 MG tablet TAKE 1 TABLET (10 MG TOTAL) BY MOUTH 3 (THREE) TIMES DAILY AS NEEDED FOR MUSCLE SPASMS.  . Insulin Infusion Pump Supplies (MINIMED INFUSION SET-MMT 399) MISC Use with insulin pump. Change infusion set q 3 days.  . insulin lispro (HUMALOG) 100 UNIT/ML injection Use up to 50 units daily in insulin pump  . lisinopril-hydrochlorothiazide (ZESTORETIC) 20-12.5 MG tablet Take 1 tablet by mouth daily.  . rosuvastatin (CRESTOR) 10 MG tablet Take 1 tablet  (10 mg total) by mouth daily.   No facility-administered encounter medications on file as of 06/24/2020.    Past Surgical History:  Procedure Laterality Date  . ABLATION    . ASD REPAIR    . CESAREAN SECTION    . COSMETIC SURGERY    . TUBAL LIGATION      Family History  Problem Relation Age of Onset  . Heart disease Mother   . Hypertension Mother   . Diabetes Father   . Heart attack Father     New complaints: No new complaints today.   Social history: Lives at home with husband and son. Reads for fun. Works in yard.   Controlled substance contract: n/a   Review of Systems  Constitutional: Negative.   HENT: Negative.   Eyes: Negative.   Respiratory: Negative.   Cardiovascular: Negative.   Gastrointestinal: Negative.   Endocrine: Negative.   Genitourinary: Negative.   Musculoskeletal: Negative.   Skin: Negative.   Allergic/Immunologic: Negative.   Neurological: Negative.   Hematological: Negative.   Psychiatric/Behavioral: Negative.   All other systems reviewed and are negative.      Objective:   Physical Exam Vitals and nursing note reviewed.  Constitutional:      Appearance: Normal appearance.  HENT:     Head: Normocephalic and atraumatic.     Right Ear: Tympanic membrane, ear canal and external ear normal.       Left Ear: Tympanic membrane, ear canal and external ear normal.     Nose: Nose normal.     Mouth/Throat:     Mouth: Mucous membranes are moist.     Pharynx: Oropharynx is clear.  Eyes:     Extraocular Movements: Extraocular movements intact.     Conjunctiva/sclera: Conjunctivae normal.     Pupils: Pupils are equal, round, and reactive to light.  Cardiovascular:     Rate and Rhythm: Normal rate and regular rhythm.  Pulmonary:     Effort: Pulmonary effort is normal.     Breath sounds: Normal breath sounds.  Abdominal:     General: Bowel sounds are normal.  Musculoskeletal:        General: Normal range of motion.     Cervical back:  Normal range of motion.  Skin:    General: Skin is warm and dry.     Capillary Refill: Capillary refill takes less than 2 seconds.  Neurological:     General: No focal deficit present.     Mental Status: She is alert and oriented to person, place, and time. Mental status is at baseline.  Psychiatric:        Mood and Affect: Mood normal.        Behavior: Behavior normal.        Thought Content: Thought content normal.        Judgment: Judgment normal.    BP 119/76   Pulse 80   Temp (!) 96.2 F (35.7 C) (Temporal)   Resp 20   Ht 5' 5" (1.651 m)   Wt 189 lb (85.7 kg)   SpO2 96%   BMI 31.45 kg/m   HGBA1c 7.0%    Assessment & Plan:  Maria Conley comes in today with chief complaint of No chief complaint on file.   Diagnosis and orders addressed:  1. Benign hypertensive heart disease without heart failure Take medication as prescribed and check blood pressure regularly. Avoid foods high in salt and eat a heart healthy diet.  - CBC with Differential/Platelet - CMP14+EGFR  2. Hyperlipidemia with target LDL less than 100 Avoid fried and fatty foods. Take medication as prescribed.  - Lipid panel  3. Type 1 diabetes mellitus without complication (HCC) Take medication as prescribed and check blood glucose levels regularly. Avoid foods that are high in carbs and sugar.  - Bayer DCA Hb A1c Waived - Microalbumin / creatinine urine ratio  4. BMI 31.0-31.9,adult Exercise regularly. Walking and swimming are great low-impact exercises that lower blood pressure, blood glucose levels, and cholesterol levels.    Labs pending Health Maintenance reviewed Diet and exercise encouraged  Follow up plan: Follow up in 3 months.   Mary-Margaret Martin, FNP  

## 2020-06-25 LAB — MICROALBUMIN / CREATININE URINE RATIO
Creatinine, Urine: 111.2 mg/dL
Microalb/Creat Ratio: 24 mg/g creat (ref 0–29)
Microalbumin, Urine: 26.4 ug/mL

## 2020-07-08 MED FILL — HumaLOG 100 UNIT/ML SOLN: 100 | 80 days supply | Qty: 40 | Fill #1

## 2020-07-25 ENCOUNTER — Other Ambulatory Visit: Payer: Self-pay | Admitting: Nurse Practitioner

## 2020-07-25 MED ORDER — PREDNISONE 20 MG PO TABS: ORAL_TABLET | ORAL | 0 refills | Status: DC

## 2020-07-25 MED FILL — predniSONE 20 MG TABS: 20 | 5 days supply | Qty: 10 | Fill #0

## 2020-08-08 DIAGNOSIS — E108 Type 1 diabetes mellitus with unspecified complications: Secondary | ICD-10-CM | POA: Diagnosis not present

## 2020-08-08 DIAGNOSIS — E109 Type 1 diabetes mellitus without complications: Secondary | ICD-10-CM | POA: Diagnosis not present

## 2020-09-08 ENCOUNTER — Ambulatory Visit: Payer: Self-pay | Admitting: Nurse Practitioner

## 2020-09-12 ENCOUNTER — Other Ambulatory Visit: Payer: Self-pay | Admitting: Nurse Practitioner

## 2020-09-12 DIAGNOSIS — E109 Type 1 diabetes mellitus without complications: Secondary | ICD-10-CM

## 2020-09-12 MED FILL — ROSUVASTATIN CALCIUM 10 MG: 10 | 90 days supply | Qty: 90 | Fill #1

## 2020-09-12 MED FILL — LISINOPRIL-HCTZ 20-12.5 MG: 20-12.5 | 90 days supply | Qty: 90 | Fill #1

## 2020-09-13 ENCOUNTER — Other Ambulatory Visit: Payer: 59

## 2020-09-13 ENCOUNTER — Other Ambulatory Visit: Payer: Self-pay | Admitting: Nurse Practitioner

## 2020-09-13 ENCOUNTER — Other Ambulatory Visit: Payer: Self-pay

## 2020-09-13 DIAGNOSIS — Z20822 Contact with and (suspected) exposure to covid-19: Secondary | ICD-10-CM | POA: Diagnosis not present

## 2020-09-15 LAB — NOVEL CORONAVIRUS, NAA: SARS-CoV-2, NAA: NOT DETECTED

## 2020-09-15 LAB — SARS-COV-2, NAA 2 DAY TAT

## 2020-09-29 ENCOUNTER — Other Ambulatory Visit: Payer: Self-pay | Admitting: Nurse Practitioner

## 2020-09-29 ENCOUNTER — Other Ambulatory Visit: Payer: Self-pay

## 2020-09-29 ENCOUNTER — Encounter: Payer: Self-pay | Admitting: Nurse Practitioner

## 2020-09-29 ENCOUNTER — Ambulatory Visit: Payer: 59 | Admitting: Nurse Practitioner

## 2020-09-29 VITALS — BP 126/72 | HR 74 | Temp 97.8°F | Resp 20 | Ht 65.0 in | Wt 192.0 lb

## 2020-09-29 DIAGNOSIS — E785 Hyperlipidemia, unspecified: Secondary | ICD-10-CM

## 2020-09-29 DIAGNOSIS — Z6831 Body mass index (BMI) 31.0-31.9, adult: Secondary | ICD-10-CM | POA: Diagnosis not present

## 2020-09-29 DIAGNOSIS — I119 Hypertensive heart disease without heart failure: Secondary | ICD-10-CM | POA: Diagnosis not present

## 2020-09-29 DIAGNOSIS — E109 Type 1 diabetes mellitus without complications: Secondary | ICD-10-CM

## 2020-09-29 LAB — BAYER DCA HB A1C WAIVED: HB A1C (BAYER DCA - WAIVED): 6.3 % (ref <7.0)

## 2020-09-29 MED ORDER — ROSUVASTATIN CALCIUM 10 MG PO TABS: 10.0000 mg | ORAL_TABLET | Freq: Every day | ORAL | 1 refills | Status: DC

## 2020-09-29 MED ORDER — INSULIN LISPRO 100 UNIT/ML ~~LOC~~ SOLN: SUBCUTANEOUS | 5 refills | Status: DC

## 2020-09-29 MED ORDER — LISINOPRIL-HYDROCHLOROTHIAZIDE 20-12.5 MG PO TABS: 1.0000 | ORAL_TABLET | Freq: Every day | ORAL | 1 refills | Status: DC

## 2020-09-29 MED FILL — HumaLOG 100 UNIT/ML SOLN: 100 | 80 days supply | Qty: 40 | Fill #0

## 2020-09-29 NOTE — Progress Notes (Signed)
Subjective:    Patient ID: Maria Conley, female    DOB: 08-20-1966, 55 y.o.   MRN: [id number]   Chief Complaint: medical management of chronic issues     HPI:  1. Type 1 diabetes mellitus without complication (HCC) Blood sugars are running around 70-90 fasting. Post prandials are around 130-140. She has insulin pump. Lab Results  Component Value Date   HGBA1C 7.0 (H) 06/24/2020     2. Hyperlipidemia with target LDL less than 100 Does try to watch diet and stays very active. Lab Results  Component Value Date   CHOL 184 06/24/2020   HDL 53 06/24/2020   LDLCALC 115 (H) 06/24/2020   TRIG 88 06/24/2020   CHOLHDL 3.5 06/24/2020     3. Benign hypertensive heart disease without heart failure No c/o chest pain, sob or headache. Does check blood pressure on occasion BP Readings from Last 3 Encounters:  09/29/20 126/72  06/24/20 119/76  03/10/20 121/75     4. BMI 31.0-31.9,adult No recent weight changes  Wt Readings from Last 3 Encounters:  09/29/20 192 lb (87.1 kg)  06/24/20 189 lb (85.7 kg)  03/10/20 180 lb (81.6 kg)   BMI Readings from Last 3 Encounters:  09/29/20 31.95 kg/m  06/24/20 31.45 kg/m  03/10/20 29.95 kg/m       Outpatient Encounter Medications as of 09/29/2020  Medication Sig  . aspirin EC 81 MG tablet Take 81 mg by mouth daily.  . cyclobenzaprine (FLEXERIL) 10 MG tablet TAKE 1 TABLET (10 MG TOTAL) BY MOUTH 3 (THREE) TIMES DAILY AS NEEDED FOR MUSCLE SPASMS.  Marland Kitchen glucose blood (CONTOUR NEXT TEST) test strip Test BS 4-5 times daily Dx E10.69  . Insulin Infusion Pump Supplies (MINIMED INFUSION SET-MMT 399) MISC Use with insulin pump. Change infusion set q 3 days.  . insulin lispro (HUMALOG) 100 UNIT/ML injection Use up to 50 units daily in insulin pump  . lisinopril-hydrochlorothiazide (ZESTORETIC) 20-12.5 MG tablet Take 1 tablet by mouth daily.  . predniSONE (DELTASONE) 20 MG tablet 2 po at sametime daily for 5 days  . rosuvastatin (CRESTOR)  10 MG tablet Take 1 tablet (10 mg total) by mouth daily.     Past Surgical History:  Procedure Laterality Date  . ABLATION    . ASD REPAIR    . CESAREAN SECTION    . COSMETIC SURGERY    . TUBAL LIGATION      Family History  Problem Relation Age of Onset  . Heart disease Mother   . Hypertension Mother   . Diabetes Father   . Heart attack Father     New complaints: None today  Social history: Lives with husband  Controlled substance contract: n/a    Review of Systems  Constitutional: Negative for diaphoresis.  Eyes: Negative for pain.  Respiratory: Negative for shortness of breath.   Cardiovascular: Negative for chest pain, palpitations and leg swelling.  Gastrointestinal: Negative for abdominal pain.  Endocrine: Negative for polydipsia.  Skin: Negative for rash.  Neurological: Negative for dizziness, weakness and headaches.  Hematological: Does not bruise/bleed easily.  All other systems reviewed and are negative.      Objective:   Physical Exam Vitals and nursing note reviewed.  Constitutional:      General: She is not in acute distress.    Appearance: Normal appearance. She is well-developed and well-nourished.  HENT:     Head: Normocephalic.     Nose: Nose normal.     Mouth/Throat:  Mouth: Oropharynx is clear and moist.  Eyes:     Extraocular Movements: EOM normal.     Pupils: Pupils are equal, round, and reactive to light.  Neck:     Vascular: No carotid bruit or JVD.  Cardiovascular:     Rate and Rhythm: Normal rate and regular rhythm.     Pulses: Intact distal pulses.     Heart sounds: Normal heart sounds.  Pulmonary:     Effort: Pulmonary effort is normal. No respiratory distress.     Breath sounds: Normal breath sounds. No wheezing or rales.  Chest:     Chest wall: No tenderness.  Abdominal:     General: Bowel sounds are normal. There is no distension or abdominal bruit. Aorta is normal.     Palpations: Abdomen is soft. There is no  hepatomegaly, splenomegaly, mass or pulsatile mass.     Tenderness: There is no abdominal tenderness.  Musculoskeletal:        General: No edema. Normal range of motion.     Cervical back: Normal range of motion and neck supple.  Lymphadenopathy:     Cervical: No cervical adenopathy.  Skin:    General: Skin is warm and dry.  Neurological:     Mental Status: She is alert and oriented to person, place, and time.     Deep Tendon Reflexes: Reflexes are normal and symmetric.  Psychiatric:        Mood and Affect: Mood and affect normal.        Behavior: Behavior normal.        Thought Content: Thought content normal.        Judgment: Judgment normal.     BP 126/72   Pulse 74   Temp 97.8 F (36.6 C) (Temporal)   Resp 20   Ht '5\' 5"'  (1.651 m)   Wt 192 lb (87.1 kg)   SpO2 99%   BMI 31.95 kg/m   hgba1c 6.3%       Assessment & Plan:  Maria Conley comes in today with chief complaint of Medical Management of Chronic Issues   Diagnosis and orders addressed:  1. Type 1 diabetes mellitus without complication (HCC) Continue to watch carbs in diet - Bayer DCA Hb A1c Waived - insulin lispro (HUMALOG) 100 UNIT/ML injection; Use up to 50 units daily in insulin pump  Dispense: 60 mL; Refill: 5  2. Hyperlipidemia with target LDL less than 100 Low fat diet - Lipid panel - rosuvastatin (CRESTOR) 10 MG tablet; Take 1 tablet (10 mg total) by mouth daily.  Dispense: 90 tablet; Refill: 1  3. Benign hypertensive heart disease without heart failure Dash diet - CBC with Differential/Platelet - CMP14+EGFR - lisinopril-hydrochlorothiazide (ZESTORETIC) 20-12.5 MG tablet; Take 1 tablet by mouth daily.  Dispense: 90 tablet; Refill: 1  4. BMI 31.0-31.9,adult Discussed diet and exercise for person with BMI >25 Will recheck weight in 3-6 months    Labs pending Health Maintenance reviewed Diet and exercise encouraged  Follow up plan: 3 months   Mary-Margaret Hassell Done, FNP

## 2020-09-29 NOTE — Patient Instructions (Signed)

## 2020-09-30 LAB — CBC WITH DIFFERENTIAL/PLATELET
Basophils Absolute: 0.1 10*3/uL (ref 0.0–0.2)
Basos: 1 %
EOS (ABSOLUTE): 0.2 10*3/uL (ref 0.0–0.4)
Eos: 3 %
Hematocrit: 45.6 % (ref 34.0–46.6)
Hemoglobin: 15.6 g/dL (ref 11.1–15.9)
Immature Grans (Abs): 0 10*3/uL (ref 0.0–0.1)
Immature Granulocytes: 0 %
Lymphocytes Absolute: 1.5 10*3/uL (ref 0.7–3.1)
Lymphs: 20 %
MCH: 29.4 pg (ref 26.6–33.0)
MCHC: 34.2 g/dL (ref 31.5–35.7)
MCV: 86 fL (ref 79–97)
Monocytes Absolute: 0.6 10*3/uL (ref 0.1–0.9)
Monocytes: 8 %
Neutrophils Absolute: 4.9 10*3/uL (ref 1.4–7.0)
Neutrophils: 68 %
Platelets: 296 10*3/uL (ref 150–450)
RBC: 5.31 x10E6/uL — ABNORMAL HIGH (ref 3.77–5.28)
RDW: 12.5 % (ref 11.7–15.4)
WBC: 7.3 10*3/uL (ref 3.4–10.8)

## 2020-09-30 LAB — CMP14+EGFR
ALT: 22 IU/L (ref 0–32)
AST: 21 IU/L (ref 0–40)
Albumin/Globulin Ratio: 2.2 (ref 1.2–2.2)
Albumin: 4.6 g/dL (ref 3.8–4.9)
Alkaline Phosphatase: 87 IU/L (ref 44–121)
BUN/Creatinine Ratio: 8 — ABNORMAL LOW (ref 9–23)
BUN: 6 mg/dL (ref 6–24)
Bilirubin Total: 0.2 mg/dL (ref 0.0–1.2)
CO2: 26 mmol/L (ref 20–29)
Calcium: 9.5 mg/dL (ref 8.7–10.2)
Chloride: 101 mmol/L (ref 96–106)
Creatinine, Ser: 0.78 mg/dL (ref 0.57–1.00)
GFR calc Af Amer: 98 mL/min/{1.73_m2} (ref >59)
GFR calc non Af Amer: 85 mL/min/{1.73_m2} (ref >59)
Globulin, Total: 2.1 g/dL (ref 1.5–4.5)
Glucose: 65 mg/dL (ref 65–99)
Potassium: 4 mmol/L (ref 3.5–5.2)
Sodium: 141 mmol/L (ref 134–144)
Total Protein: 6.7 g/dL (ref 6.0–8.5)

## 2020-09-30 LAB — LIPID PANEL
Chol/HDL Ratio: 3 ratio (ref 0.0–4.4)
Cholesterol, Total: 176 mg/dL (ref 100–199)
HDL: 59 mg/dL (ref >39)
LDL Chol Calc (NIH): 101 mg/dL — ABNORMAL HIGH (ref 0–99)
Triglycerides: 86 mg/dL (ref 0–149)
VLDL Cholesterol Cal: 16 mg/dL (ref 5–40)

## 2020-10-05 DIAGNOSIS — R61 Generalized hyperhidrosis: Secondary | ICD-10-CM | POA: Diagnosis not present

## 2020-10-05 DIAGNOSIS — E161 Other hypoglycemia: Secondary | ICD-10-CM | POA: Diagnosis not present

## 2020-10-05 DIAGNOSIS — E162 Hypoglycemia, unspecified: Secondary | ICD-10-CM | POA: Diagnosis not present

## 2020-10-05 DIAGNOSIS — R404 Transient alteration of awareness: Secondary | ICD-10-CM | POA: Diagnosis not present

## 2020-10-19 MED FILL — CYCLOBENZAPRINE HCL 10 MG T: 10 | 10 days supply | Qty: 30 | Fill #1

## 2020-12-06 ENCOUNTER — Other Ambulatory Visit (HOSPITAL_COMMUNITY): Payer: Self-pay

## 2020-12-14 ENCOUNTER — Other Ambulatory Visit (HOSPITAL_COMMUNITY): Payer: Self-pay

## 2020-12-14 MED FILL — Insulin Lispro Subcutaneous Soln 100 Unit/ML: SUBCUTANEOUS | 90 days supply | Qty: 50 | Fill #0 | Status: AC

## 2020-12-15 DIAGNOSIS — E109 Type 1 diabetes mellitus without complications: Secondary | ICD-10-CM | POA: Diagnosis not present

## 2020-12-15 DIAGNOSIS — E108 Type 1 diabetes mellitus with unspecified complications: Secondary | ICD-10-CM | POA: Diagnosis not present

## 2020-12-20 ENCOUNTER — Other Ambulatory Visit (HOSPITAL_COMMUNITY): Payer: Self-pay

## 2020-12-30 ENCOUNTER — Ambulatory Visit: Payer: 59 | Admitting: Nurse Practitioner

## 2020-12-30 ENCOUNTER — Encounter: Payer: Self-pay | Admitting: Nurse Practitioner

## 2020-12-30 ENCOUNTER — Other Ambulatory Visit: Payer: Self-pay

## 2020-12-30 ENCOUNTER — Other Ambulatory Visit (HOSPITAL_COMMUNITY): Payer: Self-pay

## 2020-12-30 VITALS — BP 109/72 | HR 84 | Temp 96.6°F | Resp 20 | Ht 65.0 in | Wt 190.0 lb

## 2020-12-30 DIAGNOSIS — E785 Hyperlipidemia, unspecified: Secondary | ICD-10-CM | POA: Diagnosis not present

## 2020-12-30 DIAGNOSIS — Z0001 Encounter for general adult medical examination with abnormal findings: Secondary | ICD-10-CM | POA: Diagnosis not present

## 2020-12-30 DIAGNOSIS — Z6831 Body mass index (BMI) 31.0-31.9, adult: Secondary | ICD-10-CM | POA: Diagnosis not present

## 2020-12-30 DIAGNOSIS — E109 Type 1 diabetes mellitus without complications: Secondary | ICD-10-CM

## 2020-12-30 DIAGNOSIS — I119 Hypertensive heart disease without heart failure: Secondary | ICD-10-CM

## 2020-12-30 DIAGNOSIS — Z0000 Encounter for general adult medical examination without abnormal findings: Secondary | ICD-10-CM

## 2020-12-30 LAB — BAYER DCA HB A1C WAIVED: HB A1C (BAYER DCA - WAIVED): 6.7 % (ref <7.0)

## 2020-12-30 MED ORDER — LISINOPRIL-HYDROCHLOROTHIAZIDE 20-12.5 MG PO TABS
1.0000 | ORAL_TABLET | Freq: Every day | ORAL | 1 refills | Status: DC
  Filled 2020-12-30 – 2021-02-21 (×2): qty 90, 90d supply, fill #0
  Filled 2021-06-16: qty 90, 90d supply, fill #1

## 2020-12-30 MED ORDER — ROSUVASTATIN CALCIUM 10 MG PO TABS
ORAL_TABLET | Freq: Every day | ORAL | 1 refills | Status: DC
  Filled 2020-12-30 – 2021-04-12 (×2): qty 90, 90d supply, fill #0

## 2020-12-30 MED ORDER — INSULIN LISPRO 100 UNIT/ML IJ SOLN
50.0000 [IU] | Freq: Every day | INTRAMUSCULAR | 5 refills | Status: DC
  Filled 2020-12-30: qty 400, 80d supply, fill #0
  Filled 2020-12-30: qty 60, fill #0
  Filled 2021-03-09: qty 20, 40d supply, fill #0
  Filled 2021-05-18: qty 20, 40d supply, fill #1

## 2020-12-30 NOTE — Addendum Note (Signed)
Addended by: Bennie Pierini on: 12/30/2020 08:56 AM   Modules accepted: Level of Service

## 2020-12-30 NOTE — Patient Instructions (Signed)
Exercising to Stay Healthy To become healthy and stay healthy, it is recommended that you do moderate-intensity and vigorous-intensity exercise. You can tell that you are exercising at a moderate intensity if your heart starts beating faster and you start breathing faster but can still hold a conversation. You can tell that you are exercising at a vigorous intensity if you are breathing much harder and faster and cannot hold a conversation while exercising. Exercising regularly is important. It has many health benefits, such as:  Improving overall fitness, flexibility, and endurance.  Increasing bone density.  Helping with weight control.  Decreasing body fat.  Increasing muscle strength.  Reducing stress and tension.  Improving overall health. How often should I exercise? Choose an activity that you enjoy, and set realistic goals. Your health care provider can help you make an activity plan that works for you. Exercise regularly as told by your health care provider. This may include:  Doing strength training two times a week, such as: ? Lifting weights. ? Using resistance bands. ? Push-ups. ? Sit-ups. ? Yoga.  Doing a certain intensity of exercise for a given amount of time. Choose from these options: ? A total of 150 minutes of moderate-intensity exercise every week. ? A total of 75 minutes of vigorous-intensity exercise every week. ? A mix of moderate-intensity and vigorous-intensity exercise every week. Children, pregnant women, people who have not exercised regularly, people who are overweight, and older adults may need to talk with a health care provider about what activities are safe to do. If you have a medical condition, be sure to talk with your health care provider before you start a new exercise program. What are some exercise ideas? Moderate-intensity exercise ideas include:  Walking 1 mile (1.6 km) in about 15  minutes.  Biking.  Hiking.  Golfing.  Dancing.  Water aerobics. Vigorous-intensity exercise ideas include:  Walking 4.5 miles (7.2 km) or more in about 1 hour.  Jogging or running 5 miles (8 km) in about 1 hour.  Biking 10 miles (16.1 km) or more in about 1 hour.  Lap swimming.  Roller-skating or in-line skating.  Cross-country skiing.  Vigorous competitive sports, such as football, basketball, and soccer.  Jumping rope.  Aerobic dancing.   What are some everyday activities that can help me to get exercise?  Yard work, such as: ? Pushing a lawn mower. ? Raking and bagging leaves.  Washing your car.  Pushing a stroller.  Shoveling snow.  Gardening.  Washing windows or floors. How can I be more active in my day-to-day activities?  Use stairs instead of an elevator.  Take a walk during your lunch break.  If you drive, park your car farther away from your work or school.  If you take public transportation, get off one stop early and walk the rest of the way.  Stand up or walk around during all of your indoor phone calls.  Get up, stretch, and walk around every 30 minutes throughout the day.  Enjoy exercise with a friend. Support to continue exercising will help you keep a regular routine of activity. What guidelines can I follow while exercising?  Before you start a new exercise program, talk with your health care provider.  Do not exercise so much that you hurt yourself, feel dizzy, or get very short of breath.  Wear comfortable clothes and wear shoes with good support.  Drink plenty of water while you exercise to prevent dehydration or heat stroke.  Work out until   your breathing and your heartbeat get faster. Where to find more information  [place] Department of Health and Human Services: www.hhs.gov  Centers for Disease Control and Prevention (CDC): www.cdc.gov Summary  Exercising regularly is important. It will improve your overall fitness,  flexibility, and endurance.  Regular exercise also will improve your overall health. It can help you control your weight, reduce stress, and improve your bone density.  Do not exercise so much that you hurt yourself, feel dizzy, or get very short of breath.  Before you start a new exercise program, talk with your health care provider. This information is not intended to replace advice given to you by your health care provider. Make sure you discuss any questions you have with your health care provider. Document Revised: 08/02/2017 Document Reviewed: 07/11/2017 Elsevier Patient Education  2021 [employer]  

## 2020-12-30 NOTE — Progress Notes (Signed)
Subjective:    Patient ID: Maria Conley, female    DOB: 10-16-1965, 55 y.o.   MRN: [id number]   Chief Complaint: medical management of chronic issues     HPI:  1. Benign hypertensive heart disease without heart failure No c/o chest pain, sob o rheadache. Does not check blood pressure at home. . BP Readings from Last 3 Encounters:  09/29/20 126/72  06/24/20 119/76  03/10/20 121/75     2. Hyperlipidemia with target LDL less than 100 Does try to wtatch diet. Does no dedicated exercise but stays very active. . Lab Results  Component Value Date   CHOL 176 09/29/2020   HDL 59 09/29/2020   LDLCALC 101 (H) 09/29/2020   TRIG 86 09/29/2020   CHOLHDL 3.0 09/29/2020     3. Type 1 diabetes mellitus without complication (HCC) fasting blood sugars are running blow 140's.Has had no lows. Lab Results  Component Value Date   HGBA1C 6.3 09/29/2020     4. BMI 31.0-31.9,adult No recent weight changes . Wt Readings from Last 3 Encounters:  12/30/20 190 lb (86.2 kg)  09/29/20 192 lb (87.1 kg)  06/24/20 189 lb (85.7 kg)   BMI Readings from Last 3 Encounters:  12/30/20 31.62 kg/m  09/29/20 31.95 kg/m  06/24/20 31.45 kg/m       Outpatient Encounter Medications as of 12/30/2020  Medication Sig  . aspirin EC 81 MG tablet Take 81 mg by mouth daily.  . cyclobenzaprine (FLEXERIL) 10 MG tablet TAKE 1 TABLET BY MOUTH 3 TIMES DAILY AS NEEDED FOR MUSCLE SPASMS  . glucose blood test strip USE AS DIRECTED TO TEST BLOOD SUGAR 4-5 TIMES DAILY  . Insulin Infusion Pump Supplies (MINIMED INFUSION SET-MMT 399) MISC Use with insulin pump. Change infusion set q 3 days.  . insulin lispro (HUMALOG) 100 UNIT/ML injection USE UP TO 50 UNITS DAILY IN INSULIN PUMP  . lisinopril-hydrochlorothiazide (ZESTORETIC) 20-12.5 MG tablet TAKE 1 TABLET BY MOUTH DAILY.  . rosuvastatin (CRESTOR) 10 MG tablet TAKE 1 TABLET BY MOUTH ONCE A DAY   No facility-administered encounter medications on file as  of 12/30/2020.    Past Surgical History:  Procedure Laterality Date  . ABLATION    . ASD REPAIR    . CESAREAN SECTION    . COSMETIC SURGERY    . TUBAL LIGATION      Family History  Problem Relation Age of Onset  . Heart disease Mother   . Hypertension Mother   . Diabetes Father   . Heart attack Father     New complaints: None today  Social history: Lives with husband and son. 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  Controlled substance contract: n/a    Review of Systems  Constitutional: Negative for diaphoresis.  Eyes: Negative for pain.  Respiratory: Negative for shortness of breath.   Cardiovascular: Negative for chest pain, palpitations and leg swelling.  Gastrointestinal: Negative for abdominal pain.  Endocrine: Negative for polydipsia.  Skin: Negative for rash.  Neurological: Negative for dizziness, weakness and headaches.  Hematological: Does not bruise/bleed easily.  All other systems reviewed and are negative.      Objective:   Physical Exam Vitals and nursing note reviewed.  Constitutional:      General: She is not in acute distress.    Appearance: Normal appearance. She is well-developed.  HENT:     Head: Normocephalic.     Nose: Nose normal.  Eyes:     Pupils: Pupils are equal, round, and  reactive to light.  Neck:     Vascular: No carotid bruit or JVD.  Cardiovascular:     Rate and Rhythm: Normal rate and regular rhythm.     Heart sounds: Normal heart sounds.  Pulmonary:     Effort: Pulmonary effort is normal. No respiratory distress.     Breath sounds: Normal breath sounds. No wheezing or rales.  Chest:     Chest wall: No tenderness.  Abdominal:     General: Bowel sounds are normal. There is no distension or abdominal bruit.     Palpations: Abdomen is soft. There is no hepatomegaly, splenomegaly, mass or pulsatile mass.     Tenderness: There is no abdominal tenderness.  Musculoskeletal:        General: Normal range of motion.     Cervical  back: Normal range of motion and neck supple.  Lymphadenopathy:     Cervical: No cervical adenopathy.  Skin:    General: Skin is warm and dry.  Neurological:     Mental Status: She is alert and oriented to person, place, and time.     Deep Tendon Reflexes: Reflexes are normal and symmetric.  Psychiatric:        Behavior: Behavior normal.        Thought Content: Thought content normal.        Judgment: Judgment normal.    BP 109/72   Pulse 84   Temp (!) 96.6 F (35.9 C) (Temporal)   Resp 20   Ht _0  (1.651 m)   Wt 190 lb (86.2 kg)   SpO2 100%   BMI 31.62 kg/m        Assessment & Plan:  Maria Conley comes in today with chief complaint of Annual Exam   Diagnosis and orders addressed:  1. Annual physical exam  2. Benign hypertensive heart disease without heart failure Low sodiun diet - CBC with Differential/Platelet - CMP14+EGFR - lisinopril-hydrochlorothiazide (ZESTORETIC) 20-12.5 MG tablet; TAKE 1 TABLET BY MOUTH DAILY.  Dispense: 90 tablet; Refill: 1  3. Hyperlipidemia with target LDL less than 100 Low fat diet - Lipid panel - rosuvastatin (CRESTOR) 10 MG tablet; TAKE 1 TABLET BY MOUTH ONCE A DAY  Dispense: 90 tablet; Refill: 1  4. Type 1 diabetes mellitus without complication (HCC) Continue to watch carbs in diet - Bayer DCA Hb A1c Waived - insulin lispro (HUMALOG) 100 UNIT/ML injection; USE UP TO 50 UNITS DAILY IN INSULIN PUMP  Dispense: 60 mL; Refill: 5  5. BMI 31.0-31.9,adult Discussed diet and exercise for person with BMI >25 Will recheck weight in 3-6 months    Labs pending Health Maintenance reviewed- patient will schedule mammogram Diet and exercise encouraged  Follow up plan: 6 month   [place], FNP

## 2020-12-31 LAB — CBC WITH DIFFERENTIAL/PLATELET
Basophils Absolute: 0.1 10*3/uL (ref 0.0–0.2)
Basos: 2 %
EOS (ABSOLUTE): 0.3 10*3/uL (ref 0.0–0.4)
Eos: 4 %
Hematocrit: 48.7 % — ABNORMAL HIGH (ref 34.0–46.6)
Hemoglobin: 16.5 g/dL — ABNORMAL HIGH (ref 11.1–15.9)
Immature Grans (Abs): 0 10*3/uL (ref 0.0–0.1)
Immature Granulocytes: 0 %
Lymphocytes Absolute: 2 10*3/uL (ref 0.7–3.1)
Lymphs: 27 %
MCH: 29.5 pg (ref 26.6–33.0)
MCHC: 33.9 g/dL (ref 31.5–35.7)
MCV: 87 fL (ref 79–97)
Monocytes Absolute: 0.7 10*3/uL (ref 0.1–0.9)
Monocytes: 10 %
Neutrophils Absolute: 4.1 10*3/uL (ref 1.4–7.0)
Neutrophils: 57 %
Platelets: 236 10*3/uL (ref 150–450)
RBC: 5.59 x10E6/uL — ABNORMAL HIGH (ref 3.77–5.28)
RDW: 13.2 % (ref 11.7–15.4)
WBC: 7.2 10*3/uL (ref 3.4–10.8)

## 2020-12-31 LAB — CMP14+EGFR
ALT: 15 IU/L (ref 0–32)
AST: 13 IU/L (ref 0–40)
Albumin/Globulin Ratio: 1.8 (ref 1.2–2.2)
Albumin: 4.2 g/dL (ref 3.8–4.9)
Alkaline Phosphatase: 80 IU/L (ref 44–121)
BUN/Creatinine Ratio: 13 (ref 9–23)
BUN: 12 mg/dL (ref 6–24)
Bilirubin Total: 0.4 mg/dL (ref 0.0–1.2)
CO2: 24 mmol/L (ref 20–29)
Calcium: 9.5 mg/dL (ref 8.7–10.2)
Chloride: 97 mmol/L (ref 96–106)
Creatinine, Ser: 0.96 mg/dL (ref 0.57–1.00)
Globulin, Total: 2.3 g/dL (ref 1.5–4.5)
Glucose: 221 mg/dL — ABNORMAL HIGH (ref 65–99)
Potassium: 4.7 mmol/L (ref 3.5–5.2)
Sodium: 137 mmol/L (ref 134–144)
Total Protein: 6.5 g/dL (ref 6.0–8.5)
eGFR: 69 mL/min/{1.73_m2} (ref >59)

## 2020-12-31 LAB — LIPID PANEL
Chol/HDL Ratio: 4 ratio (ref 0.0–4.4)
Cholesterol, Total: 226 mg/dL — ABNORMAL HIGH (ref 100–199)
HDL: 56 mg/dL (ref >39)
LDL Chol Calc (NIH): 151 mg/dL — ABNORMAL HIGH (ref 0–99)
Triglycerides: 105 mg/dL (ref 0–149)
VLDL Cholesterol Cal: 19 mg/dL (ref 5–40)

## 2021-01-10 ENCOUNTER — Other Ambulatory Visit (HOSPITAL_COMMUNITY): Payer: Self-pay

## 2021-01-17 DIAGNOSIS — H5213 Myopia, bilateral: Secondary | ICD-10-CM | POA: Diagnosis not present

## 2021-01-17 DIAGNOSIS — H52223 Regular astigmatism, bilateral: Secondary | ICD-10-CM | POA: Diagnosis not present

## 2021-01-17 DIAGNOSIS — E109 Type 1 diabetes mellitus without complications: Secondary | ICD-10-CM | POA: Diagnosis not present

## 2021-01-17 DIAGNOSIS — H2513 Age-related nuclear cataract, bilateral: Secondary | ICD-10-CM | POA: Diagnosis not present

## 2021-01-17 DIAGNOSIS — H524 Presbyopia: Secondary | ICD-10-CM | POA: Diagnosis not present

## 2021-01-17 LAB — HM DIABETES EYE EXAM

## 2021-02-21 ENCOUNTER — Other Ambulatory Visit (HOSPITAL_BASED_OUTPATIENT_CLINIC_OR_DEPARTMENT_OTHER): Payer: Self-pay

## 2021-02-21 ENCOUNTER — Other Ambulatory Visit (HOSPITAL_COMMUNITY): Payer: Self-pay

## 2021-02-22 ENCOUNTER — Other Ambulatory Visit (HOSPITAL_COMMUNITY): Payer: Self-pay

## 2021-02-25 ENCOUNTER — Other Ambulatory Visit: Payer: Self-pay | Admitting: Nurse Practitioner

## 2021-02-25 DIAGNOSIS — Z1231 Encounter for screening mammogram for malignant neoplasm of breast: Secondary | ICD-10-CM

## 2021-03-09 ENCOUNTER — Other Ambulatory Visit (HOSPITAL_COMMUNITY): Payer: Self-pay

## 2021-03-13 ENCOUNTER — Other Ambulatory Visit (HOSPITAL_COMMUNITY): Payer: Self-pay

## 2021-03-23 ENCOUNTER — Other Ambulatory Visit (HOSPITAL_COMMUNITY): Payer: Self-pay

## 2021-03-29 DIAGNOSIS — E108 Type 1 diabetes mellitus with unspecified complications: Secondary | ICD-10-CM | POA: Diagnosis not present

## 2021-03-29 DIAGNOSIS — E109 Type 1 diabetes mellitus without complications: Secondary | ICD-10-CM | POA: Diagnosis not present

## 2021-04-12 ENCOUNTER — Other Ambulatory Visit (HOSPITAL_BASED_OUTPATIENT_CLINIC_OR_DEPARTMENT_OTHER): Payer: Self-pay

## 2021-04-12 ENCOUNTER — Other Ambulatory Visit (HOSPITAL_COMMUNITY): Payer: Self-pay

## 2021-04-20 ENCOUNTER — Other Ambulatory Visit: Payer: Self-pay | Admitting: Nurse Practitioner

## 2021-04-20 ENCOUNTER — Other Ambulatory Visit (HOSPITAL_COMMUNITY): Payer: Self-pay

## 2021-04-20 MED ORDER — AZITHROMYCIN 250 MG PO TABS
ORAL_TABLET | ORAL | 0 refills | Status: DC
  Filled 2021-04-20: qty 6, 5d supply, fill #0

## 2021-04-24 ENCOUNTER — Other Ambulatory Visit (HOSPITAL_COMMUNITY): Payer: Self-pay

## 2021-04-24 MED FILL — Glucose Blood Test Strip: 90 days supply | Qty: 450 | Fill #0 | Status: CN

## 2021-05-17 ENCOUNTER — Other Ambulatory Visit (HOSPITAL_COMMUNITY): Payer: Self-pay

## 2021-05-17 MED ORDER — FREESTYLE LITE TEST VI STRP
ORAL_STRIP | 3 refills | Status: AC
  Filled 2021-05-17: qty 400, 90d supply, fill #0

## 2021-05-17 MED ORDER — FREESTYLE LANCETS MISC
3 refills | Status: AC
  Filled 2021-05-17: qty 400, 90d supply, fill #0

## 2021-05-17 MED ORDER — FREESTYLE LITE W/DEVICE KIT
PACK | 0 refills | Status: AC
  Filled 2021-05-17: qty 1, 1d supply, fill #0

## 2021-05-17 MED FILL — Glucose Blood Test Strip: 90 days supply | Qty: 450 | Fill #0 | Status: CN

## 2021-05-18 ENCOUNTER — Other Ambulatory Visit (HOSPITAL_COMMUNITY): Payer: Self-pay

## 2021-06-16 ENCOUNTER — Other Ambulatory Visit (HOSPITAL_COMMUNITY): Payer: Self-pay

## 2021-06-27 ENCOUNTER — Encounter: Payer: Self-pay | Admitting: Nurse Practitioner

## 2021-06-27 ENCOUNTER — Other Ambulatory Visit (HOSPITAL_COMMUNITY): Payer: Self-pay

## 2021-06-27 ENCOUNTER — Other Ambulatory Visit: Payer: Self-pay

## 2021-06-27 ENCOUNTER — Ambulatory Visit: Payer: 59 | Admitting: Nurse Practitioner

## 2021-06-27 VITALS — BP 107/67 | HR 84 | Temp 97.8°F | Resp 20 | Ht 65.0 in | Wt 197.0 lb

## 2021-06-27 DIAGNOSIS — I119 Hypertensive heart disease without heart failure: Secondary | ICD-10-CM | POA: Diagnosis not present

## 2021-06-27 DIAGNOSIS — Z6832 Body mass index (BMI) 32.0-32.9, adult: Secondary | ICD-10-CM

## 2021-06-27 DIAGNOSIS — E785 Hyperlipidemia, unspecified: Secondary | ICD-10-CM | POA: Diagnosis not present

## 2021-06-27 DIAGNOSIS — E109 Type 1 diabetes mellitus without complications: Secondary | ICD-10-CM

## 2021-06-27 LAB — BAYER DCA HB A1C WAIVED: HB A1C (BAYER DCA - WAIVED): 5.7 % — ABNORMAL HIGH (ref 4.8–5.6)

## 2021-06-27 MED ORDER — INSULIN LISPRO 100 UNIT/ML IJ SOLN
70.0000 [IU] | Freq: Every day | INTRAMUSCULAR | 5 refills | Status: DC
  Filled 2021-06-27 – 2021-07-18 (×2): qty 60, 85d supply, fill #0

## 2021-06-27 MED ORDER — INSULIN LISPRO 100 UNIT/ML IJ SOLN
50.0000 [IU] | Freq: Every day | INTRAMUSCULAR | 5 refills | Status: DC
  Filled 2021-06-27: qty 60, 90d supply, fill #0

## 2021-06-27 MED ORDER — LISINOPRIL-HYDROCHLOROTHIAZIDE 20-12.5 MG PO TABS
1.0000 | ORAL_TABLET | Freq: Every day | ORAL | 1 refills | Status: DC
Start: 2021-06-27 — End: 2021-10-10
  Filled 2021-06-27: qty 90, 90d supply, fill #0

## 2021-06-27 MED ORDER — ROSUVASTATIN CALCIUM 10 MG PO TABS
ORAL_TABLET | Freq: Every day | ORAL | 1 refills | Status: DC
Start: 2021-06-27 — End: 2021-10-10
  Filled 2021-06-27 – 2021-08-14 (×2): qty 90, 90d supply, fill #0

## 2021-06-27 NOTE — Progress Notes (Signed)
Subjective:    Patient ID: Maria Conley, female    DOB: 10-26-1965, 55 y.o.   MRN: [id number]   Chief Complaint: medical management of chronic issues     HPI:  1. Benign hypertensive heart disease without heart failure No c/o chest pain, sob or headache. She does check blood pressure occasionaly at work. BP Readings from Last 3 Encounters:  12/30/20 109/72  09/29/20 126/72  06/24/20 119/76     2. Hyperlipidemia with target LDL less than 100 Does try to watch diet and stays very active. She is on crestor daily. Lab Results  Component Value Date   CHOL 226 (H) 12/30/2020   HDL 56 12/30/2020   LDLCALC 151 (H) 12/30/2020   TRIG 105 12/30/2020   CHOLHDL 4.0 12/30/2020     3. Type 1 diabetes mellitus without complication (HCC) Hs insulin pump. Blood sugars are running below 140 most of the time. No lows to speak of. Lab Results  Component Value Date   HGBA1C 6.7 12/30/2020     4. BMI 31.0-31.9,adult Weight is up 7lbs Wt Readings from Last 3 Encounters:  06/27/21 197 lb (89.4 kg)  12/30/20 190 lb (86.2 kg)  09/29/20 192 lb (87.1 kg)   BMI Readings from Last 3 Encounters:  06/27/21 32.78 kg/m  12/30/20 31.62 kg/m  09/29/20 31.95 kg/m      Outpatient Encounter Medications as of 06/27/2021  Medication Sig   aspirin EC 81 MG tablet Take 81 mg by mouth daily.   azithromycin (ZITHROMAX Z-PAK) 250 MG tablet Take 2 tablets by mouth on the first day then 1 tablet daily for the next four days.   Blood Glucose Monitoring Suppl (FREESTYLE LITE) w/Device KIT Use as directed to check blood glucose levels.   glucose blood (FREESTYLE LITE) test strip Use as directed to test blood sugar 4-5 times daily.   glucose blood test strip USE AS DIRECTED TO TEST BLOOD SUGAR 4-5 TIMES DAILY (Patient taking differently: USE AS DIRECTED TO TEST BLOOD SUGAR 4-5 TIMES DAILY)   Insulin Infusion Pump Supplies (MINIMED INFUSION SET-MMT 399) MISC Use with insulin pump. Change infusion  set q 3 days.   insulin lispro (HUMALOG) 100 UNIT/ML injection Inject 0.5 mLs (50 Units total) into the skin daily.   Lancets (FREESTYLE) lancets Use as directed to test blood sugar 4-5 times daily.   lisinopril-hydrochlorothiazide (ZESTORETIC) 20-12.5 MG tablet TAKE 1 TABLET BY MOUTH DAILY.   rosuvastatin (CRESTOR) 10 MG tablet TAKE 1 TABLET BY MOUTH ONCE A DAY   No facility-administered encounter medications on file as of 06/27/2021.    Past Surgical History:  Procedure Laterality Date   ABLATION     ASD REPAIR     CESAREAN SECTION     COSMETIC SURGERY     TUBAL LIGATION      Family History  Problem Relation Age of Onset   Heart disease Mother    Hypertension Mother    Diabetes Father    Heart attack Father     New complaints: None today  Social history: Lives with her husband  Controlled substance contract: n/a     Review of Systems  Constitutional:  Negative for diaphoresis.  Eyes:  Negative for pain.  Respiratory:  Negative for shortness of breath.   Cardiovascular:  Negative for chest pain, palpitations and leg swelling.  Gastrointestinal:  Negative for abdominal pain.  Endocrine: Negative for polydipsia.  Skin:  Negative for rash.  Neurological:  Negative for dizziness, weakness and headaches.  Hematological:  Does not bruise/bleed easily.  All other systems reviewed and are negative.     Objective:   Physical Exam Vitals and nursing note reviewed.  Constitutional:      General: She is not in acute distress.    Appearance: Normal appearance. She is well-developed.  HENT:     Head: Normocephalic.     Right Ear: Tympanic membrane normal.     Left Ear: Tympanic membrane normal.     Nose: Nose normal.     Mouth/Throat:     Mouth: Mucous membranes are moist.  Eyes:     Pupils: Pupils are equal, round, and reactive to light.  Neck:     Vascular: No carotid bruit or JVD.  Cardiovascular:     Rate and Rhythm: Normal rate and regular rhythm.      Heart sounds: Normal heart sounds.  Pulmonary:     Effort: Pulmonary effort is normal. No respiratory distress.     Breath sounds: Normal breath sounds. No wheezing or rales.  Chest:     Chest wall: No tenderness.  Abdominal:     General: Bowel sounds are normal. There is no distension or abdominal bruit.     Palpations: Abdomen is soft. There is no hepatomegaly, splenomegaly, mass or pulsatile mass.     Tenderness: There is no abdominal tenderness.  Musculoskeletal:        General: Normal range of motion.     Cervical back: Normal range of motion and neck supple.  Lymphadenopathy:     Cervical: No cervical adenopathy.  Skin:    General: Skin is warm and dry.  Neurological:     Mental Status: She is alert and oriented to person, place, and time.     Deep Tendon Reflexes: Reflexes are normal and symmetric.  Psychiatric:        Behavior: Behavior normal.        Thought Content: Thought content normal.        Judgment: Judgment normal.    BP 107/67   Pulse 84   Temp 97.8 F (36.6 C) (Temporal)   Resp 20   Ht '5\' 5"'  (1.651 m)   Wt 197 lb (89.4 kg)   SpO2 99%   BMI 32.78 kg/m   HGBA1c 5.7%     Assessment & Plan:  Maria Conley comes in today with chief complaint of Medical Management of Chronic Issues   Diagnosis and orders addressed:  1. Benign hypertensive heart disease without heart failure Low sodium diet - CBC with Differential/Platelet - CMP14+EGFR - lisinopril-hydrochlorothiazide (ZESTORETIC) 20-12.5 MG tablet; TAKE 1 TABLET BY MOUTH DAILY.  Dispense: 90 tablet; Refill: 1  2. Hyperlipidemia with target LDL less than 100 Low fat diet - Lipid panel - rosuvastatin (CRESTOR) 10 MG tablet; TAKE 1 TABLET BY MOUTH ONCE A DAY  Dispense: 90 tablet; Refill: 1  3. Type 1 diabetes mellitus without complication (HCC) Continue to wtach carbs in diet - Bayer DCA Hb A1c Waived - Microalbumin / creatinine urine ratio - insulin lispro (HUMALOG) 100 UNIT/ML injection;  Inject 0.5 mLs (50 Units total) into the skin daily.  Dispense: 60 mL; Refill: 5  4. BMI 32.0-32.9,adult Discussed diet and exercise for person with BMI >25 Will recheck weight in 3-6 months    Labs pending Health Maintenance reviewed Diet and exercise encouraged  Follow up plan: 3 months   Mary-Margaret Hassell Done, FNP

## 2021-06-27 NOTE — Patient Instructions (Signed)

## 2021-06-28 ENCOUNTER — Ambulatory Visit
Admission: RE | Admit: 2021-06-28 | Discharge: 2021-06-28 | Disposition: A | Discharge status: Home / Self Care | Payer: 59 | Source: Ambulatory Visit | Attending: Nurse Practitioner | Admitting: Nurse Practitioner

## 2021-06-28 DIAGNOSIS — Z1231 Encounter for screening mammogram for malignant neoplasm of breast: Secondary | ICD-10-CM | POA: Diagnosis not present

## 2021-06-28 LAB — CBC WITH DIFFERENTIAL/PLATELET
Basophils Absolute: 0.1 10*3/uL (ref 0.0–0.2)
Basos: 1 %
EOS (ABSOLUTE): 0.3 10*3/uL (ref 0.0–0.4)
Eos: 5 %
Hematocrit: 43.7 % (ref 34.0–46.6)
Hemoglobin: 14.7 g/dL (ref 11.1–15.9)
Immature Grans (Abs): 0 10*3/uL (ref 0.0–0.1)
Immature Granulocytes: 0 %
Lymphocytes Absolute: 1.6 10*3/uL (ref 0.7–3.1)
Lymphs: 25 %
MCH: 29.1 pg (ref 26.6–33.0)
MCHC: 33.6 g/dL (ref 31.5–35.7)
MCV: 86 fL (ref 79–97)
Monocytes Absolute: 0.5 10*3/uL (ref 0.1–0.9)
Monocytes: 8 %
Neutrophils Absolute: 3.9 10*3/uL (ref 1.4–7.0)
Neutrophils: 61 %
Platelets: 224 10*3/uL (ref 150–450)
RBC: 5.06 x10E6/uL (ref 3.77–5.28)
RDW: 12.9 % (ref 11.7–15.4)
WBC: 6.4 10*3/uL (ref 3.4–10.8)

## 2021-06-28 LAB — MICROALBUMIN / CREATININE URINE RATIO
Creatinine, Urine: 65 mg/dL
Microalb/Creat Ratio: 6 mg/g creat (ref 0–29)
Microalbumin, Urine: 3.8 ug/mL

## 2021-06-28 LAB — LIPID PANEL
Chol/HDL Ratio: 2.9 ratio (ref 0.0–4.4)
Cholesterol, Total: 162 mg/dL (ref 100–199)
HDL: 56 mg/dL (ref >39)
LDL Chol Calc (NIH): 95 mg/dL (ref 0–99)
Triglycerides: 55 mg/dL (ref 0–149)
VLDL Cholesterol Cal: 11 mg/dL (ref 5–40)

## 2021-06-28 LAB — CMP14+EGFR
ALT: 12 IU/L (ref 0–32)
AST: 10 IU/L (ref 0–40)
Albumin/Globulin Ratio: 2.3 — ABNORMAL HIGH (ref 1.2–2.2)
Albumin: 4.4 g/dL (ref 3.8–4.9)
Alkaline Phosphatase: 74 IU/L (ref 44–121)
BUN/Creatinine Ratio: 10 (ref 9–23)
BUN: 9 mg/dL (ref 6–24)
Bilirubin Total: 0.5 mg/dL (ref 0.0–1.2)
CO2: 24 mmol/L (ref 20–29)
Calcium: 9.4 mg/dL (ref 8.7–10.2)
Chloride: 101 mmol/L (ref 96–106)
Creatinine, Ser: 0.86 mg/dL (ref 0.57–1.00)
Globulin, Total: 1.9 g/dL (ref 1.5–4.5)
Glucose: 216 mg/dL — ABNORMAL HIGH (ref 70–99)
Potassium: 4.3 mmol/L (ref 3.5–5.2)
Sodium: 137 mmol/L (ref 134–144)
Total Protein: 6.3 g/dL (ref 6.0–8.5)
eGFR: 79 mL/min/{1.73_m2} (ref >59)

## 2021-07-05 ENCOUNTER — Other Ambulatory Visit (HOSPITAL_COMMUNITY): Payer: Self-pay

## 2021-07-07 DIAGNOSIS — E109 Type 1 diabetes mellitus without complications: Secondary | ICD-10-CM | POA: Diagnosis not present

## 2021-07-07 DIAGNOSIS — E108 Type 1 diabetes mellitus with unspecified complications: Secondary | ICD-10-CM | POA: Diagnosis not present

## 2021-07-18 ENCOUNTER — Other Ambulatory Visit (HOSPITAL_COMMUNITY): Payer: Self-pay

## 2021-07-24 ENCOUNTER — Other Ambulatory Visit (HOSPITAL_COMMUNITY): Payer: Self-pay

## 2021-08-14 ENCOUNTER — Other Ambulatory Visit (HOSPITAL_COMMUNITY): Payer: Self-pay

## 2021-09-05 ENCOUNTER — Other Ambulatory Visit (HOSPITAL_COMMUNITY): Payer: Self-pay

## 2021-09-05 ENCOUNTER — Telehealth: Payer: Self-pay | Admitting: Nurse Practitioner

## 2021-09-05 MED ORDER — PREDNISONE 10 MG (21) PO TBPK
ORAL_TABLET | ORAL | 0 refills | Status: DC
  Filled 2021-09-05: qty 21, 6d supply, fill #0

## 2021-09-05 NOTE — Telephone Encounter (Signed)
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 Having low back pain. Has follow up appointment at the end of the week. Would like some prednisone to help till then.  Meds ordered this encounter  Medications   predniSONE (STERAPRED UNI-PAK 21 TAB) 10 MG (21) TBPK tablet    Sig: As directed x 6 days    Dispense:  21 tablet    Refill:  0    Order Specific Question:   Supervising Provider    Answer:   Nils Pyle [[id number]]   Mary-Margaret Daphine Deutscher, FNP

## 2021-09-13 ENCOUNTER — Other Ambulatory Visit (HOSPITAL_COMMUNITY): Payer: Self-pay

## 2021-09-21 ENCOUNTER — Telehealth: Payer: Self-pay | Admitting: Nurse Practitioner

## 2021-09-21 ENCOUNTER — Other Ambulatory Visit (HOSPITAL_COMMUNITY): Payer: Self-pay

## 2021-09-21 MED ORDER — FREESTYLE LIBRE 2 SENSOR MISC
1.0000 | 5 refills | Status: DC
  Filled 2021-09-21: qty 2, 28d supply, fill #0
  Filled 2021-10-13: qty 2, 28d supply, fill #1
  Filled 2021-11-22: qty 2, 28d supply, fill #2
  Filled 2022-01-03: qty 2, 28d supply, fill #3
  Filled 2022-02-05: qty 2, 28d supply, fill #4
  Filled 2022-03-02: qty 2, 28d supply, fill #5

## 2021-09-21 NOTE — Telephone Encounter (Signed)
Pt needs a prescription sent to the [facility] for [place] [facility]. She said she already has one because they sent a free one to start her off but she was told that to get more she will need a prescription from her PCP.

## 2021-09-28 ENCOUNTER — Encounter: Payer: 59 | Admitting: Nurse Practitioner

## 2021-09-29 ENCOUNTER — Other Ambulatory Visit (HOSPITAL_COMMUNITY): Payer: Self-pay

## 2021-09-29 ENCOUNTER — Other Ambulatory Visit: Payer: Self-pay | Admitting: Nurse Practitioner

## 2021-09-29 MED ORDER — VALACYCLOVIR HCL 1 G PO TABS: ORAL_TABLET | ORAL | 1 refills | Status: DC

## 2021-09-29 MED ORDER — VALACYCLOVIR HCL 1 G PO TABS
ORAL_TABLET | ORAL | 1 refills | Status: DC
  Filled 2021-09-29: qty 12, fill #0

## 2021-09-29 NOTE — Progress Notes (Signed)
Meds ordered this encounter  Medications   DISCONTD: valACYclovir (VALTREX) 1000 MG tablet    Sig: 2 po BID for 1 day- repeat at next flare up    Dispense:  12 tablet    Refill:  1    Order Specific Question:   Supervising Provider    Answer:   Hyacinth Meeker, BRIAN [[id number]]   valACYclovir (VALTREX) 1000 MG tablet    Sig: 2 po BID for 1 day- repeat at next flare up    Dispense:  12 tablet    Refill:  1    Order Specific Question:   Supervising Provider    Answer:   Eber Hong [[id number]]

## 2021-10-06 DIAGNOSIS — E108 Type 1 diabetes mellitus with unspecified complications: Secondary | ICD-10-CM | POA: Diagnosis not present

## 2021-10-06 DIAGNOSIS — E109 Type 1 diabetes mellitus without complications: Secondary | ICD-10-CM | POA: Diagnosis not present

## 2021-10-10 ENCOUNTER — Other Ambulatory Visit (HOSPITAL_COMMUNITY): Payer: Self-pay

## 2021-10-10 ENCOUNTER — Encounter: Payer: Self-pay | Admitting: Nurse Practitioner

## 2021-10-10 ENCOUNTER — Ambulatory Visit (INDEPENDENT_AMBULATORY_CARE_PROVIDER_SITE_OTHER): Payer: 59 | Admitting: Nurse Practitioner

## 2021-10-10 ENCOUNTER — Other Ambulatory Visit (HOSPITAL_COMMUNITY)
Admission: RE | Admit: 2021-10-10 | Discharge: 2021-10-10 | Disposition: A | Discharge status: Home / Self Care | Payer: 59 | Source: Ambulatory Visit | Attending: Nurse Practitioner | Admitting: Nurse Practitioner

## 2021-10-10 VITALS — BP 129/71 | HR 74 | Temp 97.4°F | Ht 65.0 in | Wt 196.2 lb

## 2021-10-10 DIAGNOSIS — Z0001 Encounter for general adult medical examination with abnormal findings: Secondary | ICD-10-CM

## 2021-10-10 DIAGNOSIS — E109 Type 1 diabetes mellitus without complications: Secondary | ICD-10-CM | POA: Diagnosis not present

## 2021-10-10 DIAGNOSIS — I119 Hypertensive heart disease without heart failure: Secondary | ICD-10-CM

## 2021-10-10 DIAGNOSIS — Z0000 Encounter for general adult medical examination without abnormal findings: Secondary | ICD-10-CM

## 2021-10-10 DIAGNOSIS — Z6831 Body mass index (BMI) 31.0-31.9, adult: Secondary | ICD-10-CM

## 2021-10-10 DIAGNOSIS — E785 Hyperlipidemia, unspecified: Secondary | ICD-10-CM | POA: Diagnosis not present

## 2021-10-10 LAB — BAYER DCA HB A1C WAIVED: HB A1C (BAYER DCA - WAIVED): 6.1 % — ABNORMAL HIGH (ref 4.8–5.6)

## 2021-10-10 MED ORDER — INSULIN LISPRO 100 UNIT/ML IJ SOLN
70.0000 [IU] | Freq: Every day | INTRAMUSCULAR | 5 refills | Status: DC
  Filled 2021-10-10: qty 60, 85d supply, fill #0

## 2021-10-10 MED ORDER — LISINOPRIL-HYDROCHLOROTHIAZIDE 20-12.5 MG PO TABS
1.0000 | ORAL_TABLET | Freq: Every day | ORAL | 1 refills | Status: DC
  Filled 2021-10-10: qty 90, 90d supply, fill #0

## 2021-10-10 MED ORDER — ROSUVASTATIN CALCIUM 10 MG PO TABS
ORAL_TABLET | Freq: Every day | ORAL | 1 refills | Status: DC
  Filled 2021-10-10: qty 90, fill #0
  Filled 2021-12-14: qty 90, 90d supply, fill #0

## 2021-10-10 NOTE — Patient Instructions (Signed)
Managing Loss, Adult °People experience loss in many different ways throughout their lives. Events such as moving, changing jobs, and losing friends can create a sense of loss. The loss may be as serious as a major health change, divorce, death of a pet, or death of a loved one. All of these types of loss are likely to create a physical and emotional reaction known as grief. Grief is the result of a major change or an absence of something or someone that you count on. Grief is a normal reaction to loss. °A variety of factors can affect your grieving experience, including: °The nature of your loss. °Your relationship to what or whom you lost. °Your understanding of grief and how to manage it. °Your support system. °Be aware that when grief becomes extreme, it can lead to more severe issues like isolation, depression, anxiety, or suicidal thoughts. Talk with your health care provider if you have any of these issues. °How to manage lifestyle changes °Keep to your normal routine as much as possible. °If you have trouble focusing or doing normal activities, it is acceptable to take some time away from your normal routine. °Spend time with friends and loved ones. °Eat a healthy diet, get plenty of sleep, and rest when you feel tired. °How to recognize changes  °The way that you deal with your grief will affect your ability to function as you normally do. When grieving, you may experience these changes: °Numbness, shock, sadness, anxiety, anger, denial, and guilt. °Thoughts about death. °Unexpected crying. °A physical sensation of emptiness in your stomach. °Problems sleeping and eating. °Tiredness (fatigue). °Loss of interest in normal activities. °Dreaming about or imagining seeing the person who died. °A need to remember what or whom you lost. °Difficulty thinking about anything other than your loss for a period of time. °Relief. If you have been expecting the loss for a while, you may feel a sense of relief when it  happens. °Follow these instructions at home: °Activity °Express your feelings in healthy ways, such as: °Talking with others about your loss. It may be helpful to find others who have had a similar loss, such as a support group. °Writing down your feelings in a journal. °Doing physical activities to release stress and emotional energy. °Doing creative activities like painting, sculpting, or playing or listening to music. °Practicing resilience. This is the ability to recover and adjust after facing challenges. Reading some resources that encourage resilience may help you to learn ways to practice those behaviors. ° °General instructions °Be patient with yourself and others. Allow the grieving process to happen, and remember that grieving takes time. °It is likely that you may never feel completely done with some grief. You may find a way to move on while still cherishing memories and feelings about your loss. °Accepting your loss is a process. It can take months or longer to adjust. °Keep all follow-up visits. This is important. °Where to find support °To get support for managing loss: °Ask your health care provider for help and recommendations, such as grief counseling or therapy. °Think about joining a support group for people who are managing a loss. °Where to find more information °You can find more information about managing loss from: °American Society of Clinical Oncology: www.cancer.net °American Psychological Association: www.apa.org °Contact a health care provider if: °Your grief is extreme and keeps getting worse. °You have ongoing grief that does not improve. °Your body shows symptoms of grief, such as illness. °You feel depressed, anxious, or   hopeless. °Get help right away if: °You have thoughts about hurting yourself or others. °Get help right away if you feel like you may hurt yourself or others, or have thoughts about taking your own life. Go to your nearest emergency room or: °Call 911. °Call the  National Suicide Prevention Lifeline at 1-800-273-8255 or 988. This is open 24 hours a day. °Text the Crisis Text Line at 741741. °Summary °Grief is the result of a major change or an absence of someone or something that you count on. Grief is a normal reaction to loss. °The depth of grief and the period of recovery depend on the type of loss and your ability to adjust to the change and process your feelings. °Processing grief requires patience and a willingness to accept your feelings and talk about your loss with people who are supportive. °It is important to find resources that work for you and to realize that people experience grief differently. There is not one grieving process that works for everyone in the same way. °Be aware that when grief becomes extreme, it can lead to more severe issues like isolation, depression, anxiety, or suicidal thoughts. Talk with your health care provider if you have any of these issues. °This information is not intended to replace advice given to you by your health care provider. Make sure you discuss any questions you have with your health care provider. °Document Revised: 04/10/2021 Document Reviewed: 04/10/2021 °Elsevier Patient Education © 2022 [employer] ° °

## 2021-10-10 NOTE — Progress Notes (Signed)
Subjective:    Patient ID: Maria Conley, female    DOB: [birth year], 56 y.o.   MRN: [id number]   Chief Complaint: No chief complaint on file.    HPI:  Maria Conley is a 56 y.o. who identifies as a female who was assigned female at birth.   Social history: Lives with: husband and 2 sons Work history: works as a Marine scientist at [employer] in today for follow up of the following chronic medical issues:  1. Annual physical exam PAP today  2. Benign hypertensive heart disease without heart failure No c/o chest pain, sob or headache. Does not check blood pressure at home. BP Readings from Last 3 Encounters:  06/27/21 107/67  12/30/20 109/72  09/29/20 126/72     3. Hyperlipidemia with target LDL less than 100 Does try to wtahc diet but is not able  to do any exercise as of late, due to taking careof her elderly mom. Her mom has since passed away and she may have more time now to be  more active. Lab Results  Component Value Date   CHOL 162 06/27/2021   HDL 56 06/27/2021   LDLCALC 95 06/27/2021   TRIG 55 06/27/2021   CHOLHDL 2.9 06/27/2021     4. Type 1 diabetes mellitus without complication (HCC) Blood sugars are running good . Avergaing around 130. No low blood sugars. Lab Results  Component Value Date   HGBA1C 5.7 (H) 06/27/2021     5. BMI 31.0-31.9,adult No recent weight changes Wt Readings from Last 3 Encounters:  06/27/21 197 lb (89.4 kg)  12/30/20 190 lb (86.2 kg)  09/29/20 192 lb (87.1 kg)   BMI Readings from Last 3 Encounters:  06/27/21 32.78 kg/m  12/30/20 31.62 kg/m  09/29/20 31.95 kg/m      Experiencing grief from the death of her mom.   No Known Allergies Outpatient Encounter Medications as of 10/10/2021  Medication Sig   aspirin EC 81 MG tablet Take 81 mg by mouth daily.   Blood Glucose Monitoring Suppl (FREESTYLE LITE) w/Device KIT Use as directed to check blood glucose levels.   Continuous Blood Gluc Sensor (FREESTYLE  LIBRE 2 SENSOR) MISC Use 1 sensor every 14 days.   glucose blood (FREESTYLE LITE) test strip Use as directed to test blood sugar 4-5 times daily.   Insulin Infusion Pump Supplies (MINIMED INFUSION SET-MMT 399) MISC Use with insulin pump. Change infusion set q 3 days.   insulin lispro (HUMALOG) 100 UNIT/ML injection Inject 0.7 mLs (70 Units total) into the skin daily.   Lancets (FREESTYLE) lancets Use as directed to test blood sugar 4-5 times daily.   lisinopril-hydrochlorothiazide (ZESTORETIC) 20-12.5 MG tablet TAKE 1 TABLET BY MOUTH DAILY.   predniSONE (STERAPRED UNI-PAK 21 TAB) 10 MG (21) TBPK tablet Follow package directions for 6 days   rosuvastatin (CRESTOR) 10 MG tablet TAKE 1 TABLET BY MOUTH ONCE A DAY   valACYclovir (VALTREX) 1000 MG tablet 2 po BID for 1 day- repeat at next flare up   No facility-administered encounter medications on file as of 10/10/2021.    Past Surgical History:  Procedure Laterality Date   ABLATION     ASD REPAIR     CESAREAN SECTION     COSMETIC SURGERY     TUBAL LIGATION      Family History  Problem Relation Age of Onset   Heart disease Mother    Hypertension Mother    Diabetes Father  Heart attack Father    Breast cancer Maternal Grandmother       Controlled substance contract: n/a     Review of Systems  Constitutional:  Negative for diaphoresis.  Eyes:  Negative for pain.  Respiratory:  Negative for shortness of breath.   Cardiovascular:  Negative for chest pain, palpitations and leg swelling.  Gastrointestinal:  Negative for abdominal pain.  Endocrine: Negative for polydipsia.  Skin:  Negative for rash.  Neurological:  Negative for dizziness, weakness and headaches.  Hematological:  Does not bruise/bleed easily.  All other systems reviewed and are negative.     Objective:   Physical Exam Vitals and nursing note reviewed.  Constitutional:      General: She is not in acute distress.    Appearance: Normal appearance. She is  well-developed.  HENT:     Head: Normocephalic.     Right Ear: Tympanic membrane normal.     Left Ear: Tympanic membrane normal.     Nose: Nose normal.     Mouth/Throat:     Mouth: Mucous membranes are moist.  Eyes:     Pupils: Pupils are equal, round, and reactive to light.  Neck:     Vascular: No carotid bruit or JVD.  Cardiovascular:     Rate and Rhythm: Normal rate and regular rhythm.     Heart sounds: Normal heart sounds.  Pulmonary:     Effort: Pulmonary effort is normal. No respiratory distress.     Breath sounds: Normal breath sounds. No wheezing or rales.  Chest:     Chest wall: No tenderness.  Abdominal:     General: Bowel sounds are normal. There is no distension or abdominal bruit.     Palpations: Abdomen is soft. There is no hepatomegaly, splenomegaly, mass or pulsatile mass.     Tenderness: There is no abdominal tenderness.  Musculoskeletal:        General: Normal range of motion.     Cervical back: Normal range of motion and neck supple.  Lymphadenopathy:     Cervical: No cervical adenopathy.  Skin:    General: Skin is warm and dry.  Neurological:     Mental Status: She is alert and oriented to person, place, and time.     Deep Tendon Reflexes: Reflexes are normal and symmetric.  Psychiatric:        Behavior: Behavior normal.        Thought Content: Thought content normal.        Judgment: Judgment normal.   BP 129/71    Pulse 74    Temp (!) 97.4 F (36.3 C)    Ht 5' 5" (1.651 m)    Wt 196 lb 3.2 oz (89 kg)    SpO2 100%    BMI 32.65 kg/m         Assessment & Plan:   Maria Conley comes in today with chief complaint of Annual Exam   Diagnosis and orders addressed:  1. Annual physical exam Labs pending - CBC with Differential/Platelet - Thyroid Panel With TSH - VITAMIN D 25 Hydroxy (Vit-D Deficiency, Fractures) - Cytology - PAP  2. Benign hypertensive heart disease without heart failure Low sodium diet - lisinopril-hydrochlorothiazide  (ZESTORETIC) 20-12.5 MG tablet; TAKE 1 TABLET BY MOUTH DAILY.  Dispense: 90 tablet; Refill: 1  3. Hyperlipidemia with target LDL less than 100 Low fat diet - CMP14+EGFR - Lipid panel - rosuvastatin (CRESTOR) 10 MG tablet; TAKE 1 TABLET BY MOUTH ONCE A DAY  Dispense:  90 tablet; Refill: 1  4. Type 1 diabetes mellitus without complication (HCC) Continue to watch carbs in diet - Conley DCA Hb A1c Waived - insulin lispro (HUMALOG) 100 UNIT/ML injection; Inject 0.7 mLs (70 Units total) into the skin daily.  Dispense: 60 mL; Refill: 5  5. BMI 31.0-31.9,adult Discussed diet and exercise for person with BMI >25 Will recheck weight in 3-6 months    Labs pending Health Maintenance reviewed Diet and exercise encouraged  Follow up plan: 3 months   Mary-Margaret Hassell Done, FNP

## 2021-10-11 LAB — CMP14+EGFR
ALT: 14 IU/L (ref 0–32)
AST: 12 IU/L (ref 0–40)
Albumin/Globulin Ratio: 2.2 (ref 1.2–2.2)
Albumin: 4.3 g/dL (ref 3.8–4.9)
Alkaline Phosphatase: 97 IU/L (ref 44–121)
BUN/Creatinine Ratio: 14 (ref 9–23)
BUN: 11 mg/dL (ref 6–24)
Bilirubin Total: 0.3 mg/dL (ref 0.0–1.2)
CO2: 26 mmol/L (ref 20–29)
Calcium: 9.3 mg/dL (ref 8.7–10.2)
Chloride: 101 mmol/L (ref 96–106)
Creatinine, Ser: 0.81 mg/dL (ref 0.57–1.00)
Globulin, Total: 2 g/dL (ref 1.5–4.5)
Glucose: 156 mg/dL — ABNORMAL HIGH (ref 70–99)
Potassium: 4.7 mmol/L (ref 3.5–5.2)
Sodium: 141 mmol/L (ref 134–144)
Total Protein: 6.3 g/dL (ref 6.0–8.5)
eGFR: 85 mL/min/{1.73_m2} (ref >59)

## 2021-10-11 LAB — CBC WITH DIFFERENTIAL/PLATELET
Basophils Absolute: 0.1 10*3/uL (ref 0.0–0.2)
Basos: 1 %
EOS (ABSOLUTE): 0.2 10*3/uL (ref 0.0–0.4)
Eos: 3 %
Hematocrit: 43.6 % (ref 34.0–46.6)
Hemoglobin: 14.6 g/dL (ref 11.1–15.9)
Immature Grans (Abs): 0 10*3/uL (ref 0.0–0.1)
Immature Granulocytes: 0 %
Lymphocytes Absolute: 1.9 10*3/uL (ref 0.7–3.1)
Lymphs: 25 %
MCH: 29 pg (ref 26.6–33.0)
MCHC: 33.5 g/dL (ref 31.5–35.7)
MCV: 87 fL (ref 79–97)
Monocytes Absolute: 0.5 10*3/uL (ref 0.1–0.9)
Monocytes: 6 %
Neutrophils Absolute: 4.9 10*3/uL (ref 1.4–7.0)
Neutrophils: 65 %
Platelets: 254 10*3/uL (ref 150–450)
RBC: 5.03 x10E6/uL (ref 3.77–5.28)
RDW: 13.2 % (ref 11.7–15.4)
WBC: 7.6 10*3/uL (ref 3.4–10.8)

## 2021-10-11 LAB — LIPID PANEL
Chol/HDL Ratio: 2.9 ratio (ref 0.0–4.4)
Cholesterol, Total: 172 mg/dL (ref 100–199)
HDL: 59 mg/dL (ref >39)
LDL Chol Calc (NIH): 100 mg/dL — ABNORMAL HIGH (ref 0–99)
Triglycerides: 71 mg/dL (ref 0–149)
VLDL Cholesterol Cal: 13 mg/dL (ref 5–40)

## 2021-10-11 LAB — THYROID PANEL WITH TSH
Free Thyroxine Index: 2.3 (ref 1.2–4.9)
T3 Uptake Ratio: 27 % (ref 24–39)
T4, Total: 8.4 ug/dL (ref 4.5–12.0)
TSH: 1.58 u[IU]/mL (ref 0.450–4.500)

## 2021-10-11 LAB — VITAMIN D 25 HYDROXY (VIT D DEFICIENCY, FRACTURES): Vit D, 25-Hydroxy: 16.6 ng/mL — ABNORMAL LOW (ref 30.0–100.0)

## 2021-10-13 ENCOUNTER — Other Ambulatory Visit (HOSPITAL_COMMUNITY): Payer: Self-pay

## 2021-10-16 LAB — CYTOLOGY - PAP
Comment: NEGATIVE
Diagnosis: UNDETERMINED — AB
High risk HPV: NEGATIVE

## 2021-11-06 ENCOUNTER — Other Ambulatory Visit (HOSPITAL_COMMUNITY): Payer: Self-pay

## 2021-11-22 ENCOUNTER — Other Ambulatory Visit (HOSPITAL_COMMUNITY): Payer: Self-pay

## 2021-12-14 ENCOUNTER — Other Ambulatory Visit (HOSPITAL_COMMUNITY): Payer: Self-pay

## 2022-01-03 ENCOUNTER — Other Ambulatory Visit (HOSPITAL_COMMUNITY): Payer: Self-pay

## 2022-01-08 ENCOUNTER — Ambulatory Visit: Payer: 59 | Admitting: Nurse Practitioner

## 2022-01-08 ENCOUNTER — Other Ambulatory Visit (HOSPITAL_COMMUNITY): Payer: Self-pay

## 2022-01-08 ENCOUNTER — Encounter: Payer: Self-pay | Admitting: Nurse Practitioner

## 2022-01-08 VITALS — BP 121/77 | HR 71 | Temp 97.1°F | Resp 20 | Ht 65.0 in | Wt 198.0 lb

## 2022-01-08 DIAGNOSIS — E785 Hyperlipidemia, unspecified: Secondary | ICD-10-CM | POA: Diagnosis not present

## 2022-01-08 DIAGNOSIS — I119 Hypertensive heart disease without heart failure: Secondary | ICD-10-CM

## 2022-01-08 DIAGNOSIS — Z6831 Body mass index (BMI) 31.0-31.9, adult: Secondary | ICD-10-CM | POA: Diagnosis not present

## 2022-01-08 DIAGNOSIS — E109 Type 1 diabetes mellitus without complications: Secondary | ICD-10-CM | POA: Diagnosis not present

## 2022-01-08 LAB — BAYER DCA HB A1C WAIVED: HB A1C (BAYER DCA - WAIVED): 5.9 % — ABNORMAL HIGH (ref 4.8–5.6)

## 2022-01-08 MED ORDER — INSULIN LISPRO 100 UNIT/ML IJ SOLN
70.0000 [IU] | Freq: Every day | INTRAMUSCULAR | 5 refills | Status: DC
  Filled 2022-01-08: qty 60, 85d supply, fill #0
  Filled 2022-04-27: qty 60, 85d supply, fill #1

## 2022-01-08 MED ORDER — ROSUVASTATIN CALCIUM 10 MG PO TABS
ORAL_TABLET | Freq: Every day | ORAL | 1 refills | Status: DC
  Filled 2022-01-08: qty 90, fill #0
  Filled 2022-03-18 – 2022-03-28 (×2): qty 90, 90d supply, fill #0
  Filled 2022-07-04: qty 90, 90d supply, fill #1

## 2022-01-08 MED ORDER — LISINOPRIL-HYDROCHLOROTHIAZIDE 20-12.5 MG PO TABS
1.0000 | ORAL_TABLET | Freq: Every day | ORAL | 1 refills | Status: DC
  Filled 2022-01-08: qty 90, 90d supply, fill #0
  Filled 2022-05-02: qty 90, 90d supply, fill #1

## 2022-01-08 MED ORDER — CYCLOBENZAPRINE HCL 10 MG PO TABS
10.0000 mg | ORAL_TABLET | Freq: Three times a day (TID) | ORAL | 1 refills | Status: DC | PRN
  Filled 2022-01-08: qty 30, 10d supply, fill #0
  Filled 2022-04-16: qty 30, 10d supply, fill #1

## 2022-01-08 NOTE — Progress Notes (Signed)
? ?Subjective:  ? ? Patient ID: Maria Conley, female    DOB: 1965/09/05, 56 y.o.   MRN: [id number] ? ? ?Chief Complaint: medical management of chronic issues  ?  ? ?HPI: ? ?Maria Conley is a 56 y.o. who identifies as a female who was assigned female at birth.  ? ?Social history: ?Lives with: her husband and 2 sons ?Work history: nurse with [employer] ? ? ?Comes in today for follow up of the following chronic medical issues: ? ?1. Benign hypertensive heart disease without heart failure ?No c/o chest pain, sob or headache. Does not check blood pressure at home. ?BP Readings from Last 3 Encounters:  ?10/10/21 129/71  ?06/27/21 107/67  ?12/30/20 109/72  ? ? ? ?2. Type 1 diabetes mellitus without complication (HCC) ?Blood sugars have been running around 110-140. Is on insulin pump. ?Lab Results  ?Component Value Date  ? HGBA1C 6.1 (H) 10/10/2021  ? ? ? ? ?3. Hyperlipidemia with target LDL less than 100 ?Does try to watch diet. Does no dedicated exercise. ?Lab Results  ?Component Value Date  ? CHOL 172 10/10/2021  ? HDL 59 10/10/2021  ? LDLCALC 100 (H) 10/10/2021  ? TRIG 71 10/10/2021  ? CHOLHDL 2.9 10/10/2021  ? ? ? ?4. BMI 31.0-31.9,adult ?No recent weight changes ?Wt Readings from Last 3 Encounters:  ?01/08/22 198 lb (89.8 kg)  ?10/10/21 196 lb 3.2 oz (89 kg)  ?06/27/21 197 lb (89.4 kg)  ? ?BMI Readings from Last 3 Encounters:  ?01/08/22 32.95 kg/m?  ?10/10/21 32.65 kg/m?  ?06/27/21 32.78 kg/m?  ? ? ? ?New complaints: ?None today ? ?No Known Allergies ?Outpatient Encounter Medications as of 01/08/2022  ?Medication Sig  ? aspirin EC 81 MG tablet Take 81 mg by mouth daily.  ? Blood Glucose Monitoring Suppl (FREESTYLE LITE) w/Device KIT Use as directed to check blood glucose levels.  ? Continuous Blood Gluc Sensor (FREESTYLE LIBRE 2 SENSOR) MISC Use 1 sensor every 14 days.  ? glucose blood (FREESTYLE LITE) test strip Use as directed to test blood sugar 4-5 times daily.  ? Insulin Infusion Pump Supplies (MINIMED  INFUSION SET-MMT 399) MISC Use with insulin pump. ?Change infusion set q 3 days.  ? insulin lispro (HUMALOG) 100 UNIT/ML injection Inject 0.7 mLs (70 Units total) into the skin daily.  ? Lancets (FREESTYLE) lancets Use as directed to test blood sugar 4-5 times daily.  ? lisinopril-hydrochlorothiazide (ZESTORETIC) 20-12.5 MG tablet TAKE 1 TABLET BY MOUTH DAILY.  ? rosuvastatin (CRESTOR) 10 MG tablet TAKE 1 TABLET BY MOUTH ONCE A DAY  ? valACYclovir (VALTREX) 1000 MG tablet 2 po BID for 1 day- repeat at next flare up  ? ?No facility-administered encounter medications on file as of 01/08/2022.  ? ? ?Past Surgical History:  ?Procedure Laterality Date  ? ABLATION    ? ASD REPAIR    ? CESAREAN SECTION    ? COSMETIC SURGERY    ? TUBAL LIGATION    ? ? ?Family History  ?Problem Relation Age of Onset  ? Heart disease Mother   ? Hypertension Mother   ? Diabetes Father   ? Heart attack Father   ? Breast cancer Maternal Grandmother   ? ? ? ? ?Controlled substance contract: n/a ? ? ? ? ?Review of Systems  ?Constitutional:  Negative for diaphoresis.  ?Eyes:  Negative for pain.  ?Respiratory:  Negative for shortness of breath.   ?Cardiovascular:  Negative for chest pain, palpitations and leg swelling.  ?Gastrointestinal:  Negative  for abdominal pain.  ?Endocrine: Negative for polydipsia.  ?Skin:  Negative for rash.  ?Neurological:  Negative for dizziness, weakness and headaches.  ?Hematological:  Does not bruise/bleed easily.  ?All other systems reviewed and are negative. ? ?   ?Objective:  ? Physical Exam ?Vitals and nursing note reviewed.  ?Constitutional:   ?   General: She is not in acute distress. ?   Appearance: Normal appearance. She is well-developed.  ?HENT:  ?   Head: Normocephalic.  ?   Right Ear: Tympanic membrane normal.  ?   Left Ear: Tympanic membrane normal.  ?   Nose: Nose normal.  ?   Mouth/Throat:  ?   Mouth: Mucous membranes are moist.  ?Eyes:  ?   Pupils: Pupils are equal, round, and reactive to light.  ?Neck:  ?    Vascular: No carotid bruit or JVD.  ?Cardiovascular:  ?   Rate and Rhythm: Normal rate and regular rhythm.  ?   Heart sounds: Normal heart sounds.  ?Pulmonary:  ?   Effort: Pulmonary effort is normal. No respiratory distress.  ?   Breath sounds: Normal breath sounds. No wheezing or rales.  ?Chest:  ?   Chest wall: No tenderness.  ?Abdominal:  ?   General: Bowel sounds are normal. There is no distension or abdominal bruit.  ?   Palpations: Abdomen is soft. There is no hepatomegaly, splenomegaly, mass or pulsatile mass.  ?   Tenderness: There is no abdominal tenderness.  ?Musculoskeletal:     ?   General: Normal range of motion.  ?   Cervical back: Normal range of motion and neck supple.  ?Lymphadenopathy:  ?   Cervical: No cervical adenopathy.  ?Skin: ?   General: Skin is warm and dry.  ?Neurological:  ?   Mental Status: She is alert and oriented to person, place, and time.  ?   Deep Tendon Reflexes: Reflexes are normal and symmetric.  ?Psychiatric:     ?   Behavior: Behavior normal.     ?   Thought Content: Thought content normal.     ?   Judgment: Judgment normal.  ? ? ?BP 121/77   Pulse 71   Temp (!) 97.1 ?F (36.2 ?C) (Temporal)   Resp 20   Ht _0  (1.651 m)   Wt 198 lb (89.8 kg)   SpO2 99%   BMI 32.95 kg/m?  ? ?HGBA1c 5.9% ? ? ?   ?Assessment & Plan:  ?[name] comes in today with chief complaint of Medical Management of Chronic Issues ? ? ?Diagnosis and orders addressed: ? ?1. Benign hypertensive heart disease without heart failure ?Low sodium diet ?- CBC with Differential/Platelet ?- CMP14+EGFR ?- lisinopril-hydrochlorothiazide (ZESTORETIC) 20-12.5 MG tablet; TAKE 1 TABLET BY MOUTH DAILY.  Dispense: 90 tablet; Refill: 1 ? ?2. Type 1 diabetes mellitus without complication (HCC) ?Continue to watch carbs in diet ?- Bayer DCA Hb A1c Waived ?- insulin lispro (HUMALOG) 100 UNIT/ML injection; Inject 0.7 mLs (70 Units total) into the skin daily.  Dispense: 60 mL; Refill: 5 ? ?3. Hyperlipidemia with  target LDL less than 100 ?Low fta diet ?- Lipid panel ?- rosuvastatin (CRESTOR) 10 MG tablet; TAKE 1 TABLET BY MOUTH ONCE A DAY  Dispense: 90 tablet; Refill: 1 ? ?4. BMI 31.0-31.9,adult ?Discussed diet and exercise for person with BMI >25 ?Will recheck weight in 3-6 months ? ? ? ?Labs pending ?Health Maintenance reviewed ?Diet and exercise encouraged ? ?Follow up plan: ?6 months ? ? ?  Mary-Margaret Hassell Done, FNP ? ? ?

## 2022-01-08 NOTE — Patient Instructions (Signed)
Diabetes Mellitus and Foot Care Foot care is an important part of your health, especially when you have diabetes. Diabetes may cause you to have problems because of poor blood flow (circulation) to your feet and legs, which can cause your skin to: Become thinner and drier. Break more easily. Heal more slowly. Peel and crack. You may also have nerve damage (neuropathy) in your legs and feet, causing decreased feeling in them. This means that you may not notice minor injuries to your feet that could lead to more serious problems. Noticing and addressing any potential problems early is the best way to prevent future foot problems. How to care for your feet Foot hygiene  Wash your feet daily with warm water and mild soap. Do not use hot water. Then, pat your feet and the areas between your toes until they are completely dry. Do not soak your feet as this can dry your skin. Trim your toenails straight across. Do not dig under them or around the cuticle. File the edges of your nails with an emery board or nail file. Apply a moisturizing lotion or petroleum jelly to the skin on your feet and to dry, brittle toenails. Use lotion that does not contain alcohol and is unscented. Do not apply lotion between your toes. Shoes and socks Wear clean socks or stockings every day. Make sure they are not too tight. Do not wear knee-high stockings since they may decrease blood flow to your legs. Wear shoes that fit properly and have enough cushioning. Always look in your shoes before you put them on to be sure there are no objects inside. To break in new shoes, wear them for just a few hours a day. This prevents injuries on your feet. Wounds, scrapes, corns, and calluses  Check your feet daily for blisters, cuts, bruises, sores, and redness. If you cannot see the bottom of your feet, use a mirror or ask someone for help. Do not cut corns or calluses or try to remove them with medicine. If you find a minor scrape,  cut, or break in the skin on your feet, keep it and the skin around it clean and dry. You may clean these areas with mild soap and water. Do not clean the area with peroxide, alcohol, or iodine. If you have a wound, scrape, corn, or callus on your foot, look at it several times a day to make sure it is healing and not infected. Check for: Redness, swelling, or pain. Fluid or blood. Warmth. Pus or a bad smell. General tips Do not cross your legs. This may decrease blood flow to your feet. Do not use heating pads or hot water bottles on your feet. They may burn your skin. If you have lost feeling in your feet or legs, you may not know this is happening until it is too late. Protect your feet from hot and cold by wearing shoes, such as at the beach or on hot pavement. Schedule a complete foot exam at least once a year (annually) or more often if you have foot problems. Report any cuts, sores, or bruises to your health care provider immediately. Where to find more information American Diabetes Association: www.diabetes.org Association of Diabetes Care & Education Specialists: www.diabeteseducator.org Contact a health care provider if: You have a medical condition that increases your risk of infection and you have any cuts, sores, or bruises on your feet. You have an injury that is not healing. You have redness on your legs or feet. You   feel burning or tingling in your legs or feet. You have pain or cramps in your legs and feet. Your legs or feet are numb. Your feet always feel cold. You have pain around any toenails. Get help right away if: You have a wound, scrape, corn, or callus on your foot and: You have pain, swelling, or redness that gets worse. You have fluid or blood coming from the wound, scrape, corn, or callus. Your wound, scrape, corn, or callus feels warm to the touch. You have pus or a bad smell coming from the wound, scrape, corn, or callus. You have a fever. You have a red  line going up your leg. Summary Check your feet every day for blisters, cuts, bruises, sores, and redness. Apply a moisturizing lotion or petroleum jelly to the skin on your feet and to dry, brittle toenails. Wear shoes that fit properly and have enough cushioning. If you have foot problems, report any cuts, sores, or bruises to your health care provider immediately. Schedule a complete foot exam at least once a year (annually) or more often if you have foot problems. This information is not intended to replace advice given to you by your health care provider. Make sure you discuss any questions you have with your health care provider. Document Revised: 03/10/2020 Document Reviewed: 03/10/2020 Elsevier Patient Education  2023 [employer] Inc.  

## 2022-01-09 ENCOUNTER — Other Ambulatory Visit (HOSPITAL_COMMUNITY): Payer: Self-pay

## 2022-01-09 LAB — CMP14+EGFR
ALT: 19 IU/L (ref 0–32)
AST: 17 IU/L (ref 0–40)
Albumin/Globulin Ratio: 2 (ref 1.2–2.2)
Albumin: 4.4 g/dL (ref 3.8–4.9)
Alkaline Phosphatase: 79 IU/L (ref 44–121)
BUN/Creatinine Ratio: 12 (ref 9–23)
BUN: 10 mg/dL (ref 6–24)
Bilirubin Total: 0.3 mg/dL (ref 0.0–1.2)
CO2: 26 mmol/L (ref 20–29)
Calcium: 9.6 mg/dL (ref 8.7–10.2)
Chloride: 103 mmol/L (ref 96–106)
Creatinine, Ser: 0.86 mg/dL (ref 0.57–1.00)
Globulin, Total: 2.2 g/dL (ref 1.5–4.5)
Glucose: 99 mg/dL (ref 70–99)
Potassium: 4.2 mmol/L (ref 3.5–5.2)
Sodium: 143 mmol/L (ref 134–144)
Total Protein: 6.6 g/dL (ref 6.0–8.5)
eGFR: 79 mL/min/{1.73_m2} (ref >59)

## 2022-01-09 LAB — CBC WITH DIFFERENTIAL/PLATELET
Basophils Absolute: 0.1 10*3/uL (ref 0.0–0.2)
Basos: 1 %
EOS (ABSOLUTE): 0.4 10*3/uL (ref 0.0–0.4)
Eos: 6 %
Hematocrit: 44.7 % (ref 34.0–46.6)
Hemoglobin: 15 g/dL (ref 11.1–15.9)
Immature Grans (Abs): 0 10*3/uL (ref 0.0–0.1)
Immature Granulocytes: 0 %
Lymphocytes Absolute: 2.2 10*3/uL (ref 0.7–3.1)
Lymphs: 31 %
MCH: 29.4 pg (ref 26.6–33.0)
MCHC: 33.6 g/dL (ref 31.5–35.7)
MCV: 88 fL (ref 79–97)
Monocytes Absolute: 0.6 10*3/uL (ref 0.1–0.9)
Monocytes: 8 %
Neutrophils Absolute: 3.8 10*3/uL (ref 1.4–7.0)
Neutrophils: 54 %
Platelets: 227 10*3/uL (ref 150–450)
RBC: 5.1 x10E6/uL (ref 3.77–5.28)
RDW: 13.2 % (ref 11.7–15.4)
WBC: 7.2 10*3/uL (ref 3.4–10.8)

## 2022-01-09 LAB — LIPID PANEL
Chol/HDL Ratio: 3.1 ratio (ref 0.0–4.4)
Cholesterol, Total: 163 mg/dL (ref 100–199)
HDL: 53 mg/dL (ref >39)
LDL Chol Calc (NIH): 85 mg/dL (ref 0–99)
Triglycerides: 142 mg/dL (ref 0–149)
VLDL Cholesterol Cal: 25 mg/dL (ref 5–40)

## 2022-02-05 ENCOUNTER — Other Ambulatory Visit (HOSPITAL_COMMUNITY): Payer: Self-pay

## 2022-03-02 ENCOUNTER — Other Ambulatory Visit (HOSPITAL_COMMUNITY): Payer: Self-pay

## 2022-03-19 ENCOUNTER — Other Ambulatory Visit (HOSPITAL_COMMUNITY): Payer: Self-pay

## 2022-03-28 ENCOUNTER — Other Ambulatory Visit (HOSPITAL_COMMUNITY): Payer: Self-pay

## 2022-03-28 ENCOUNTER — Other Ambulatory Visit: Payer: Self-pay | Admitting: Nurse Practitioner

## 2022-03-28 MED ORDER — FREESTYLE LIBRE 2 SENSOR MISC
1.0000 | 5 refills | Status: DC
  Filled 2022-03-28: qty 2, 28d supply, fill #0
  Filled 2022-04-18: qty 2, 28d supply, fill #1
  Filled 2022-05-15: qty 2, 28d supply, fill #2
  Filled 2022-06-05 – 2022-06-06 (×3): qty 2, 28d supply, fill #3
  Filled 2022-07-04: qty 2, 28d supply, fill #4
  Filled 2022-08-04: qty 2, 28d supply, fill #5

## 2022-03-29 ENCOUNTER — Other Ambulatory Visit (HOSPITAL_COMMUNITY): Payer: Self-pay

## 2022-04-16 ENCOUNTER — Telehealth: Payer: 59 | Admitting: Family Medicine

## 2022-04-16 ENCOUNTER — Other Ambulatory Visit (HOSPITAL_COMMUNITY): Payer: Self-pay

## 2022-04-16 DIAGNOSIS — M549 Dorsalgia, unspecified: Secondary | ICD-10-CM

## 2022-04-16 MED ORDER — PREDNISONE 10 MG (21) PO TBPK
ORAL_TABLET | ORAL | 0 refills | Status: DC
  Filled 2022-04-16: qty 21, 6d supply, fill #0

## 2022-04-16 NOTE — Progress Notes (Signed)
We are sorry that you are not feeling well.  Here is how we plan to help!  Based on what you have shared with me it looks like you mostly have acute back pain.  Acute back pain is defined as musculoskeletal pain that can resolve in 1-3 weeks with conservative treatment.  Please keep in mind that muscle relaxer's can cause fatigue and should not be taken while at work or driving.  Back pain is very common.  The pain often gets better over time.  The cause of back pain is usually not dangerous.  Most people can learn to manage their back pain on their own.  I will order the steroid dose pack.   Home Care Stay active.  Start with short walks on flat ground if you can.  Try to walk farther each day. Do not sit, drive or stand in one place for more than 30 minutes.  Do not stay in bed. Do not avoid exercise or work.  Activity can help your back heal faster. Be careful when you bend or lift an object.  Bend at your knees, keep the object close to you, and do not twist. Sleep on a firm mattress.  Lie on your side, and bend your knees.  If you lie on your back, put a pillow under your knees. Only take medicines as told by your doctor. Put ice on the injured area. Put ice in a plastic bag Place a towel between your skin and the bag Leave the ice on for 15-20 minutes, 3-4 times a day for the first 2-3 days. 210 After that, you can switch between ice and heat packs. Ask your doctor about back exercises or massage. Avoid feeling anxious or stressed.  Find good ways to deal with stress, such as exercise.  Get Help Right Way If: Your pain does not go away with rest or medicine. Your pain does not go away in 1 week. You have new problems. You do not feel well. The pain spreads into your legs. You cannot control when you poop (bowel movement) or pee (urinate) You feel sick to your stomach (nauseous) or throw up (vomit) You have belly (abdominal) pain. You feel like you may pass out (faint). If you  develop a fever.  Make Sure you: Understand these instructions. Will watch your condition Will get help right away if you are not doing well or get worse.  Your e-visit answers were reviewed by a board certified advanced clinical practitioner to complete your personal care plan.  Depending on the condition, your plan could have included both over the counter or prescription medications.  If there is a problem please reply  once you have received a response from your provider.  Your safety is important to [place].  If you have drug allergies check your prescription carefully.    You can use MyChart to ask questions about today's visit, request a non-urgent call back, or ask for a work or school excuse for 24 hours related to this e-Visit. If it has been greater than 24 hours you will need to follow up with your provider, or enter a new e-Visit to address those concerns.  You will get an e-mail in the next two days asking about your experience.  I hope that your e-visit has been valuable and will speed your recovery. Thank you for using e-visits.  I provided 5 minutes of non face-to-face time during this encounter for chart review, medication and order placement, as well as  and documentation.

## 2022-04-18 ENCOUNTER — Other Ambulatory Visit (HOSPITAL_COMMUNITY): Payer: Self-pay

## 2022-04-19 ENCOUNTER — Other Ambulatory Visit (HOSPITAL_COMMUNITY): Payer: Self-pay

## 2022-04-27 ENCOUNTER — Other Ambulatory Visit (HOSPITAL_COMMUNITY): Payer: Self-pay

## 2022-04-30 ENCOUNTER — Other Ambulatory Visit: Payer: Self-pay | Admitting: Nurse Practitioner

## 2022-04-30 DIAGNOSIS — M545 Low back pain, unspecified: Secondary | ICD-10-CM

## 2022-05-02 ENCOUNTER — Other Ambulatory Visit (HOSPITAL_COMMUNITY): Payer: Self-pay

## 2022-05-02 ENCOUNTER — Other Ambulatory Visit: Payer: Self-pay | Admitting: Nurse Practitioner

## 2022-05-03 ENCOUNTER — Ambulatory Visit: Payer: 59 | Attending: Nurse Practitioner | Admitting: Physical Therapy

## 2022-05-03 ENCOUNTER — Encounter: Payer: Self-pay | Admitting: Physical Therapy

## 2022-05-03 ENCOUNTER — Other Ambulatory Visit: Payer: Self-pay

## 2022-05-03 DIAGNOSIS — G8929 Other chronic pain: Secondary | ICD-10-CM | POA: Diagnosis not present

## 2022-05-03 DIAGNOSIS — M62838 Other muscle spasm: Secondary | ICD-10-CM | POA: Diagnosis not present

## 2022-05-03 DIAGNOSIS — M545 Low back pain, unspecified: Secondary | ICD-10-CM | POA: Diagnosis not present

## 2022-05-03 DIAGNOSIS — M5459 Other low back pain: Secondary | ICD-10-CM | POA: Insufficient documentation

## 2022-05-03 NOTE — Therapy (Addendum)
OUTPATIENT PHYSICAL THERAPY THORACOLUMBAR EVALUATION   Patient Name: Maria Conley MRN: [id number] DOB:[birth year], 56 y.o., female Today's Date: 05/03/2022   PT End of Session - 05/03/22 1548     Visit Number 1    Number of Visits 12    Date for PT Re-Evaluation 06/14/22    Authorization Type FOTO.    PT Start Time 0315    PT Stop Time 0402    PT Time Calculation (min) 47 min    Activity Tolerance Patient tolerated treatment well    Behavior During Therapy WFL for tasks assessed/performed             Past Medical History:  Diagnosis Date   Diabetes mellitus without complication (HCC)    Hypertension    Systolic murmur    Vitamin D deficiency    Past Surgical History:  Procedure Laterality Date   ABLATION     ASD REPAIR     CESAREAN SECTION     COSMETIC SURGERY     TUBAL LIGATION     Patient Active Problem List   Diagnosis Date Noted   BMI 31.0-31.9,adult 06/10/2015   Hyperlipidemia with target LDL less than 100 07/08/2013   Type 1 diabetes mellitus (HCC) 11/10/2012   Benign hypertensive heart disease without heart failure 11/10/2012   S/P atrial septal defect closure 11/10/2012     REFERRING PROVIDER: Paulene Floor  REFERRING DIAG: Chronic midline low back pain without sciatica.  Rationale for Evaluation and Treatment Rehabilitation  THERAPY DIAG:  Other low back pain - Plan: PT plan of care cert/re-cert  Other muscle spasm - Plan: PT plan of care cert/re-cert  ONSET DATE: Ongoing over many years with exacerbation in January and August of this year.  SUBJECTIVE:                                                                                                                                                                                           SUBJECTIVE STATEMENT: The patient presents to the clinic with a long history of low back pain.  She reports right-sided low back pain that feels like contractions.  She has tried Chiropractic care but it was  not helpful.  She had a significant exacerbation of pain in January related to helping her Mother.  Prednisone was helpful at that time but her most recent exacerbation this month has not relented.  She has a grandchild and would like to hold him without her back hurting.  Rest, heat and Ibuprofen can help decrease her pain some.  Certain movements can produce very high pain-levels. PERTINENT HISTORY:  DM,   PAIN:  Are  you having pain? Yes: NPRS scale: 3/10 Pain location: Right low back. Pain description: Contraction, sharp, sore.   PRECAUTIONS: None  WEIGHT BEARING RESTRICTIONS No  FALLS:  Has patient fallen in last 6 months? No  OCCUPATION: Nurse.  PLOF: Independent  PATIENT GOALS:  Hold grandchild without pain.   OBJECTIVE:   PATIENT SURVEYS:  FOTO Complete    POSTURE: No Significant postural limitations  PALPATION: Very tender to palpation with a great deal of tone over her right QL.  LUMBAR ROM:   Full active lumbar flexion and extension.   LOWER EXTREMITY MMT:   Normal LE strength.  LUMBAR SPECIAL TESTS:  Straight leg raise test: Negative.  Equal leg lengths.  GAIT: WNL.   TODAY'S TREATMENT  IFC at 80-150 Hz on 40% scan x 20 minutes to patient's right low back.  Normal modality response following removal of modality.  HOME EXERCISE PROGRAM: SKTC with 30 sec holds.  ASSESSMENT:  CLINICAL IMPRESSION:  The patient presents to OPPT chronic right-sided low back with exacerbation in January and August of this year.  She was found to be very tender to palpation over her right QL that also exhibited a great deal of tone.  Her active lumbar flexion and extension is normal as is her LE strength.  Special testing is negative.  Certain movements produce high pain-levels.  Patient will benefit from skilled physical therapy intervention to address pain and deficits.  OBJECTIVE IMPAIRMENTS increased muscle spasms and pain.   ACTIVITY LIMITATIONS  Carrying  grandchild.  PERSONAL FACTORS Time since onset of injury/illness/exacerbation are also affecting patient's functional outcome.   REHAB POTENTIAL: Excellent  CLINICAL DECISION MAKING: Stable/uncomplicated  EVALUATION COMPLEXITY: Low   GOALS:  LONG TERM GOALS: Target date: 06/14/2022  Ind with a HEP. Baseline:  Goal status: INITIAL  2.  Perform ADL's with pain not > 2/10. Baseline:  Goal status: INITIAL  3.  Carry grandchild with pain not > 1-2/10. Baseline:  Goal status: INITIAL   PLAN: PT FREQUENCY: 2x/week  PT DURATION: 6 weeks  PLANNED INTERVENTIONS: Therapeutic exercises, Therapeutic activity, Patient/Family education, Self Care, Dry Needling, Electrical stimulation, Cryotherapy, Moist heat, Ultrasound, and Manual therapy.  PLAN FOR NEXT SESSION: Combo e'stim/US, STW/M (right QL release technique), core exercise progression.   Camala Talwar, [place], PT 05/03/2022, 5:28 PM

## 2022-05-08 ENCOUNTER — Other Ambulatory Visit (HOSPITAL_COMMUNITY): Payer: Self-pay

## 2022-05-08 MED ORDER — CYCLOBENZAPRINE HCL 10 MG PO TABS
10.0000 mg | ORAL_TABLET | Freq: Three times a day (TID) | ORAL | 1 refills | Status: DC | PRN
  Filled 2022-05-08: qty 30, 10d supply, fill #0
  Filled 2022-07-26: qty 30, 10d supply, fill #1

## 2022-05-10 ENCOUNTER — Encounter: Payer: Self-pay | Admitting: *Deleted

## 2022-05-10 ENCOUNTER — Ambulatory Visit: Payer: 59 | Attending: Nurse Practitioner | Admitting: *Deleted

## 2022-05-10 DIAGNOSIS — M62838 Other muscle spasm: Secondary | ICD-10-CM | POA: Diagnosis not present

## 2022-05-10 DIAGNOSIS — M5459 Other low back pain: Secondary | ICD-10-CM | POA: Insufficient documentation

## 2022-05-10 NOTE — Therapy (Signed)
OUTPATIENT PHYSICAL THERAPY THORACOLUMBAR Treatment   Patient Name: Maria Conley MRN: [id number] DOB:04/27/1966, 56 y.o., female Today's Date: 05/10/2022   PT End of Session - 05/10/22 1605     Visit Number 2    Number of Visits 12    Date for PT Re-Evaluation 06/14/22    Authorization Type FOTO.    PT Start Time 1515    PT Stop Time 1605    PT Time Calculation (min) 50 min             Past Medical History:  Diagnosis Date   Diabetes mellitus without complication (HCC)    Hypertension    Systolic murmur    Vitamin D deficiency    Past Surgical History:  Procedure Laterality Date   ABLATION     ASD REPAIR     CESAREAN SECTION     COSMETIC SURGERY     TUBAL LIGATION     Patient Active Problem List   Diagnosis Date Noted   BMI 31.0-31.9,adult 06/10/2015   Hyperlipidemia with target LDL less than 100 07/08/2013   Type 1 diabetes mellitus (HCC) 11/10/2012   Benign hypertensive heart disease without heart failure 11/10/2012   S/P atrial septal defect closure 11/10/2012     REFERRING PROVIDER: Paulene Floor  REFERRING DIAG: Chronic midline low back pain without sciatica.  Rationale for Evaluation and Treatment Rehabilitation  THERAPY DIAG:  Other low back pain  Other muscle spasm  ONSET DATE: Ongoing over many years with exacerbation in January and August of this year.  SUBJECTIVE:                                                                                                                                                                                           SUBJECTIVE STATEMENT: Doing a little better today with decreased mm spasms   PERTINENT HISTORY:  DM,   PAIN:  Are you having pain? Yes: NPRS scale: 3/10 Pain location: Right low back. Pain description: Contraction, sharp, sore.   PRECAUTIONS: None  WEIGHT BEARING RESTRICTIONS No  FALLS:  Has patient fallen in last 6 months? No  OCCUPATION: Nurse.  PLOF: Independent  PATIENT  GOALS:  Hold grandchild without pain.   OBJECTIVE:   05-10-22  TODAY'S TREATMENT  [place] combo x 10 mins @1 .5 w/cm2  to RT side LB/proximal QL T12-L1 area in LT side lying   MANUAL: STW/TPR toRT side  LB paras and RT QL      IFC at 80-150 Hz on 40% scan x 15 minutes to patient's right low back.  Normal modality response following removal of modality.  HOME EXERCISE PROGRAM:  ASSESSMENT:  CLINICAL IMPRESSION:    Pt arrived today reporting doing better with decreased mm spasms in RT QL. Rx focused on this area with [place] combo and TPR as well as IFC and HMP. Good TPR and decreased tightness and pain end of session.    OBJECTIVE IMPAIRMENTS increased muscle spasms and pain.   ACTIVITY LIMITATIONS  Carrying grandchild.  PERSONAL FACTORS Time since onset of injury/illness/exacerbation are also affecting patient's functional outcome.   REHAB POTENTIAL: Excellent  CLINICAL DECISION MAKING: Stable/uncomplicated  EVALUATION COMPLEXITY: Low   GOALS:  LONG TERM GOALS: Target date: 06/21/2022  Ind with a HEP. Baseline:  Goal status: INITIAL  2.  Perform ADL's with pain not > 2/10. Baseline:  Goal status: INITIAL  3.  Carry grandchild with pain not > 1-2/10. Baseline:  Goal status: INITIAL   PLAN: PT FREQUENCY: 2x/week  PT DURATION: 6 weeks  PLANNED INTERVENTIONS: Therapeutic exercises, Therapeutic activity, Patient/Family education, Self Care, Dry Needling, Electrical stimulation, Cryotherapy, Moist heat, Ultrasound, and Manual therapy.  PLAN FOR NEXT SESSION: Combo e'stim/US, STW/M (right QL release technique), core exercise progression.   Anuradha Chabot,CHRIS, PTA 05/10/2022, 4:33 PM

## 2022-05-15 ENCOUNTER — Ambulatory Visit: Payer: 59 | Admitting: *Deleted

## 2022-05-15 ENCOUNTER — Encounter: Payer: Self-pay | Admitting: *Deleted

## 2022-05-15 ENCOUNTER — Other Ambulatory Visit (HOSPITAL_COMMUNITY): Payer: Self-pay

## 2022-05-15 DIAGNOSIS — M62838 Other muscle spasm: Secondary | ICD-10-CM

## 2022-05-15 DIAGNOSIS — M5459 Other low back pain: Secondary | ICD-10-CM

## 2022-05-15 NOTE — Therapy (Signed)
OUTPATIENT PHYSICAL THERAPY THORACOLUMBAR Treatment   Patient Name: Maria Conley MRN: [id number] DOB:[birth year], 56 y.o., female Today's Date: 05/15/2022   PT End of Session - 05/15/22 1703     Visit Number 3    Number of Visits 12    Date for PT Re-Evaluation 06/14/22    Authorization Type FOTO.    PT Start Time 1600    PT Stop Time 1651    PT Time Calculation (min) 51 min              Past Medical History:  Diagnosis Date   Diabetes mellitus without complication (HCC)    Hypertension    Systolic murmur    Vitamin D deficiency    Past Surgical History:  Procedure Laterality Date   ABLATION     ASD REPAIR     CESAREAN SECTION     COSMETIC SURGERY     TUBAL LIGATION     Patient Active Problem List   Diagnosis Date Noted   BMI 31.0-31.9,adult 06/10/2015   Hyperlipidemia with target LDL less than 100 07/08/2013   Type 1 diabetes mellitus (HCC) 11/10/2012   Benign hypertensive heart disease without heart failure 11/10/2012   S/P atrial septal defect closure 11/10/2012     REFERRING PROVIDER: Paulene Floor  REFERRING DIAG: Chronic midline low back pain without sciatica.  Rationale for Evaluation and Treatment Rehabilitation  THERAPY DIAG:  Other low back pain  Other muscle spasm  ONSET DATE: Ongoing over many years with exacerbation in January and August of this year.  SUBJECTIVE:                                                                                                                                                                                           SUBJECTIVE STATEMENT: Doing a little better today with decreased mm spasms   PERTINENT HISTORY:  DM,   PAIN:  Are you having pain? Yes: NPRS scale: 3/10 Pain location: Right low back. Pain description: Contraction, sharp, sore.   PRECAUTIONS: None  WEIGHT BEARING RESTRICTIONS No  FALLS:  Has patient fallen in last 6 months? No  OCCUPATION: Nurse.  PLOF:  Independent  PATIENT GOALS:  Hold grandchild without pain.   OBJECTIVE:   05-15-22  TODAY'S TREATMENT  Therex: [facility] for pelvic control and spinal mobility  x 10 and find neutral pelvis, Draw-in in Hooklying x10 and hold 5 secs for TA activation. Handout given for HEP with good demonstration MANUAL: STW/TPR to RT side QL and  LB paras  with Pt LT side lying      IFC at 80-150 Hz on 40% scan x  15 minutes to patient's right low back.  Marland Kitchen  HOME EXERCISE PROGRAM:   ASSESSMENT:  CLINICAL IMPRESSION:    Pt arrived today reporting doing better with decreased mm spasms in RT QL and mainly soreness and some tightness.Marland Kitchen Rx focused on adding core activation exs with draw-in and spinal mobility and pelvic control with SOC. Pt felt good end of session.   OBJECTIVE IMPAIRMENTS increased muscle spasms and pain.   ACTIVITY LIMITATIONS  Carrying grandchild.  PERSONAL FACTORS Time since onset of injury/illness/exacerbation are also affecting patient's functional outcome.   REHAB POTENTIAL: Excellent  CLINICAL DECISION MAKING: Stable/uncomplicated  EVALUATION COMPLEXITY: Low   GOALS:  LONG TERM GOALS: Target date: 06/26/2022  Ind with a HEP. Baseline:  Goal status: INITIAL  2.  Perform ADL's with pain not > 2/10. Baseline:  Goal status: INITIAL  3.  Carry grandchild with pain not > 1-2/10. Baseline:  Goal status: INITIAL   PLAN: PT FREQUENCY: 2x/week  PT DURATION: 6 weeks  PLANNED INTERVENTIONS: Therapeutic exercises, Therapeutic activity, Patient/Family education, Self Care, Dry Needling, Electrical stimulation, Cryotherapy, Moist heat, Ultrasound, and Manual therapy.  PLAN FOR NEXT SESSION: Combo e'stim/US, STW/M (right QL release technique), core exercise progression.   Reilley Valentine,CHRIS, PTA 05/15/2022, 5:04 PM

## 2022-05-17 ENCOUNTER — Other Ambulatory Visit (HOSPITAL_COMMUNITY): Payer: Self-pay

## 2022-05-22 ENCOUNTER — Encounter: Payer: 59 | Admitting: *Deleted

## 2022-05-25 ENCOUNTER — Ambulatory Visit: Payer: 59 | Admitting: *Deleted

## 2022-05-25 DIAGNOSIS — M62838 Other muscle spasm: Secondary | ICD-10-CM | POA: Diagnosis not present

## 2022-05-25 DIAGNOSIS — M5459 Other low back pain: Secondary | ICD-10-CM

## 2022-05-25 NOTE — Therapy (Signed)
OUTPATIENT PHYSICAL THERAPY THORACOLUMBAR Treatment   Patient Name: Maria Conley MRN: [id number] DOB:[birth year], 57 y.o., female Today's Date: 05/25/2022   PT End of Session - 05/25/22 0945     Visit Number 4    Number of Visits 12    Date for PT Re-Evaluation 06/14/22    Authorization Type FOTO.    PT Start Time 0945    PT Stop Time 1016    PT Time Calculation (min) 31 min              Past Medical History:  Diagnosis Date   Diabetes mellitus without complication ([place])    Hypertension    Systolic murmur    Vitamin D deficiency    Past Surgical History:  Procedure Laterality Date   ABLATION     ASD REPAIR     CESAREAN SECTION     COSMETIC SURGERY     TUBAL LIGATION     Patient Active Problem List   Diagnosis Date Noted   BMI 31.0-31.9,adult 06/10/2015   Hyperlipidemia with target LDL less than 100 07/08/2013   Type 1 diabetes mellitus ([place]) 11/10/2012   Benign hypertensive heart disease without heart failure 11/10/2012   S/P atrial septal defect closure 11/10/2012     REFERRING PROVIDER: Ronnald Collum  REFERRING DIAG: Chronic midline low back pain without sciatica.  Rationale for Evaluation and Treatment Rehabilitation  THERAPY DIAG:  Other low back pain  Other muscle spasm  ONSET DATE: Ongoing over many years with exacerbation in January and August of this year.  SUBJECTIVE:                                                                                                                                                                                           SUBJECTIVE STATEMENT: Doing a little better today with decreased, mainly tightness at times. Be put on hold x 2 weeks   PERTINENT HISTORY:  DM,   PAIN:  Are you having pain? Yes: NPRS scale: 0/10 Pain location: Right low back. Pain description: Contraction, sharp, sore.   PRECAUTIONS: None  WEIGHT BEARING RESTRICTIONS No  FALLS:  Has patient fallen in last 6 months?  No  OCCUPATION: Nurse.  PLOF: Independent  PATIENT GOALS:  Hold grandchild without pain.   OBJECTIVE:   05-25-22  TODAY'S TREATMENT  Therex: SOC for pelvic control and spinal mobility  x 10 and find neutral pelvis,  Draw-in in Hooklying x10 and hold 5 secs for TA activation.  Drawin with marchingx6 hold 5 secs each side Bridge x 10 hold 5 secs Side lying Hip ABD given for QL activation   .  HOME EXERCISE PROGRAM: All HEP exercises reviewed and Pt is independent and Handout given.   ASSESSMENT:  CLINICAL IMPRESSION:    Pt arrived today reporting doing much better with only some tightness after certain act.'s or working a long shift. She was able to meet LTGs and had a good FOTO score. Pt was able to perform core exercises from last Rx and progressions without increased pain. Handout given for HEP progression. Pt requested to be put on hold x 2 weeks incase of flare up. OBJECTIVE IMPAIRMENTS increased muscle spasms and pain.   ACTIVITY LIMITATIONS  Carrying grandchild.  PERSONAL FACTORS Time since onset of injury/illness/exacerbation are also affecting patient's functional outcome.   REHAB POTENTIAL: Excellent  CLINICAL DECISION MAKING: Stable/uncomplicated  EVALUATION COMPLEXITY: Low   GOALS:  LONG TERM GOALS: Target date: 07/06/2022  Ind with a HEP. Baseline:  Goal status: MET  2.  Perform ADL's with pain not > 2/10. Baseline:  Goal status: MET  3.  Carry grandchild with pain not > 1-2/10. Baseline:  Goal status: MET   PLAN: PT FREQUENCY: 2x/week  PT DURATION: 6 weeks  PLANNED INTERVENTIONS: Therapeutic exercises, Therapeutic activity, Patient/Family education, Self Care, Dry Needling, Electrical stimulation, Cryotherapy, Moist heat, Ultrasound, and Manual therapy.  PLAN FOR NEXT SESSION:   Put on hold for 2 weeks as per Pt.        Combo e'stim/US, STW/M (right QL release technique), core exercise progression.   Krissa Utke,CHRIS, PTA 05/25/2022, 11:25  AM

## 2022-06-05 ENCOUNTER — Other Ambulatory Visit (HOSPITAL_COMMUNITY): Payer: Self-pay

## 2022-06-06 ENCOUNTER — Other Ambulatory Visit (HOSPITAL_COMMUNITY): Payer: Self-pay

## 2022-06-25 ENCOUNTER — Telehealth: Payer: Self-pay | Admitting: Nurse Practitioner

## 2022-06-25 NOTE — Telephone Encounter (Signed)
Pt aware FMLA just faxed to [employer]

## 2022-06-26 ENCOUNTER — Other Ambulatory Visit: Payer: Self-pay | Admitting: Nurse Practitioner

## 2022-06-26 DIAGNOSIS — Z1231 Encounter for screening mammogram for malignant neoplasm of breast: Secondary | ICD-10-CM

## 2022-07-02 ENCOUNTER — Ambulatory Visit
Admission: RE | Admit: 2022-07-02 | Discharge: 2022-07-02 | Disposition: A | Discharge status: Home / Self Care | Payer: 59 | Source: Ambulatory Visit | Attending: Nurse Practitioner | Admitting: Nurse Practitioner

## 2022-07-02 DIAGNOSIS — Z1231 Encounter for screening mammogram for malignant neoplasm of breast: Secondary | ICD-10-CM

## 2022-07-04 ENCOUNTER — Other Ambulatory Visit (HOSPITAL_COMMUNITY): Payer: Self-pay

## 2022-07-05 ENCOUNTER — Other Ambulatory Visit: Payer: Self-pay | Admitting: Nurse Practitioner

## 2022-07-05 DIAGNOSIS — R928 Other abnormal and inconclusive findings on diagnostic imaging of breast: Secondary | ICD-10-CM

## 2022-07-06 ENCOUNTER — Other Ambulatory Visit (HOSPITAL_COMMUNITY): Payer: Self-pay

## 2022-07-12 ENCOUNTER — Ambulatory Visit: Payer: 59 | Admitting: Nurse Practitioner

## 2022-07-12 DIAGNOSIS — E109 Type 1 diabetes mellitus without complications: Secondary | ICD-10-CM | POA: Diagnosis not present

## 2022-07-12 DIAGNOSIS — E108 Type 1 diabetes mellitus with unspecified complications: Secondary | ICD-10-CM | POA: Diagnosis not present

## 2022-07-17 ENCOUNTER — Encounter: Payer: Self-pay | Admitting: Nurse Practitioner

## 2022-07-17 ENCOUNTER — Ambulatory Visit: Payer: 59 | Admitting: Nurse Practitioner

## 2022-07-17 ENCOUNTER — Other Ambulatory Visit (HOSPITAL_COMMUNITY): Payer: Self-pay

## 2022-07-17 VITALS — BP 134/79 | HR 78 | Temp 97.9°F | Resp 20 | Ht 65.0 in | Wt 203.0 lb

## 2022-07-17 DIAGNOSIS — E109 Type 1 diabetes mellitus without complications: Secondary | ICD-10-CM | POA: Diagnosis not present

## 2022-07-17 DIAGNOSIS — I119 Hypertensive heart disease without heart failure: Secondary | ICD-10-CM

## 2022-07-17 DIAGNOSIS — Z6831 Body mass index (BMI) 31.0-31.9, adult: Secondary | ICD-10-CM

## 2022-07-17 DIAGNOSIS — E785 Hyperlipidemia, unspecified: Secondary | ICD-10-CM | POA: Diagnosis not present

## 2022-07-17 LAB — BAYER DCA HB A1C WAIVED: HB A1C (BAYER DCA - WAIVED): 6.1 % — ABNORMAL HIGH (ref 4.8–5.6)

## 2022-07-17 MED ORDER — LISINOPRIL-HYDROCHLOROTHIAZIDE 20-12.5 MG PO TABS
1.0000 | ORAL_TABLET | Freq: Every day | ORAL | 1 refills | Status: DC
  Filled 2022-07-17: qty 90, 90d supply, fill #0

## 2022-07-17 MED ORDER — INSULIN LISPRO 100 UNIT/ML IJ SOLN
70.0000 [IU] | Freq: Every day | INTRAMUSCULAR | 5 refills | Status: DC
  Filled 2022-07-17: qty 60, 85d supply, fill #0

## 2022-07-17 MED ORDER — ROSUVASTATIN CALCIUM 10 MG PO TABS
ORAL_TABLET | Freq: Every day | ORAL | 1 refills | Status: DC
  Filled 2022-07-17: qty 90, fill #0
  Filled 2022-09-28: qty 90, 90d supply, fill #0

## 2022-07-17 NOTE — Patient Instructions (Signed)

## 2022-07-17 NOTE — Progress Notes (Signed)
Subjective:    Patient ID: Maria Conley, female    DOB: 1965/09/23, 56 y.o.   MRN: [id number]   Chief Complaint: medical management of chronic issues     HPI:  Maria Conley is a 56 y.o. who identifies as a female who was assigned female at birth.   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 Benign hypertensive heart disease without heart failure No c/o chest pain, sob or headache. Does not check blood pressure at home. BP Readings from Last 3 Encounters:  01/08/22 121/77  10/10/21 129/71  06/27/21 107/67     2. Hyperlipidemia with target LDL less than 100 Does try to watch diet but does no dedicated exercise. Lab Results  Component Value Date   CHOL 163 01/08/2022   HDL 53 01/08/2022   LDLCALC 85 01/08/2022   TRIG 142 01/08/2022   CHOLHDL 3.1 01/08/2022     3. Type 1 diabetes mellitus without complication (HCC) Fasting blood sugars are running around 110-160 most of the time. Denies any low blood sugars. Has insulin pump Lab Results  Component Value Date   HGBA1C 5.9 (H) 01/08/2022    4. BMI 31.0-31.9,adult No recent weight changes Wt Readings from Last 3 Encounters:  07/17/22 203 lb (92.1 kg)  01/08/22 198 lb (89.8 kg)  10/10/21 196 lb 3.2 oz (89 kg)   BMI Readings from Last 3 Encounters:  07/17/22 33.78 kg/m  01/08/22 32.95 kg/m  10/10/21 32.65 kg/m     New complaints: None today  No Known Allergies Outpatient Encounter Medications as of 07/17/2022  Medication Sig   aspirin EC 81 MG tablet Take 81 mg by mouth daily.   Blood Glucose Monitoring Suppl (FREESTYLE LITE) w/Device KIT Use as directed to check blood glucose levels.   Cholecalciferol (VITAMIN D) 50 MCG (2000 UT) tablet Take 2,000 Units by mouth daily.   Continuous Blood Gluc Sensor (FREESTYLE LIBRE 2 SENSOR) MISC Use 1 sensor every 14 days.   cyclobenzaprine  (FLEXERIL) 10 MG tablet Take 1 tablet (10 mg total) by mouth 3 (three) times daily as needed for muscle spasms.   glucose blood (FREESTYLE LITE) test strip Use as directed to test blood sugar 4-5 times daily.   Insulin Infusion Pump Supplies (MINIMED INFUSION SET-MMT 399) MISC Use with insulin pump. Change infusion set q 3 days.   insulin lispro (HUMALOG) 100 UNIT/ML injection Inject 0.7 mLs (70 Units total) into the skin daily.   Lancets (FREESTYLE) lancets Use as directed to test blood sugar 4-5 times daily.   lisinopril-hydrochlorothiazide (ZESTORETIC) 20-12.5 MG tablet TAKE 1 TABLET BY MOUTH DAILY.   predniSONE (STERAPRED UNI-PAK 21 TAB) 10 MG (21) TBPK tablet Taper as directed in package for 6 days.   rosuvastatin (CRESTOR) 10 MG tablet TAKE 1 TABLET BY MOUTH ONCE A DAY   valACYclovir (VALTREX) 1000 MG tablet 2 po BID for 1 day- repeat at next flare up   No facility-administered encounter medications on file as of 07/17/2022.    Past Surgical History:  Procedure Laterality Date   ABLATION     ASD REPAIR     CESAREAN SECTION     COSMETIC SURGERY     TUBAL LIGATION      Family History  Problem Relation Age of Onset   Heart disease Mother    Hypertension Mother    Diabetes Father  Heart attack Father    Breast cancer Maternal Grandmother       Controlled substance contract: n/a     Review of Systems  Constitutional:  Negative for diaphoresis.  Eyes:  Negative for pain.  Respiratory:  Negative for shortness of breath.   Cardiovascular:  Negative for chest pain, palpitations and leg swelling.  Gastrointestinal:  Negative for abdominal pain.  Endocrine: Negative for polydipsia.  Skin:  Negative for rash.  Neurological:  Negative for dizziness, weakness and headaches.  Hematological:  Does not bruise/bleed easily.  All other systems reviewed and are negative.      Objective:   Physical Exam Vitals and nursing note reviewed.  Constitutional:      General: She  is not in acute distress.    Appearance: Normal appearance. She is well-developed.  HENT:     Head: Normocephalic.     Right Ear: Tympanic membrane normal.     Left Ear: Tympanic membrane normal.     Nose: Nose normal.     Mouth/Throat:     Mouth: Mucous membranes are moist.  Eyes:     Pupils: Pupils are equal, round, and reactive to light.  Neck:     Vascular: No carotid bruit or JVD.  Cardiovascular:     Rate and Rhythm: Normal rate and regular rhythm.     Heart sounds: Normal heart sounds.  Pulmonary:     Effort: Pulmonary effort is normal. No respiratory distress.     Breath sounds: Normal breath sounds. No wheezing or rales.  Chest:     Chest wall: No tenderness.  Abdominal:     General: Bowel sounds are normal. There is no distension or abdominal bruit.     Palpations: Abdomen is soft. There is no hepatomegaly, splenomegaly, mass or pulsatile mass.     Tenderness: There is no abdominal tenderness.  Musculoskeletal:        General: Normal range of motion.     Cervical back: Normal range of motion and neck supple.  Lymphadenopathy:     Cervical: No cervical adenopathy.  Skin:    General: Skin is warm and dry.  Neurological:     Mental Status: She is alert and oriented to person, place, and time.     Deep Tendon Reflexes: Reflexes are normal and symmetric.  Psychiatric:        Behavior: Behavior normal.        Thought Content: Thought content normal.        Judgment: Judgment normal.     BP 134/79   Pulse 78   Temp 97.9 F (36.6 C) (Temporal)   Resp 20   Ht _0  (1.651 m)   Wt 203 lb (92.1 kg)   SpO2 100%   BMI 33.78 kg/m   Hgba1c 6.1%     Assessment & Plan:   Maria Conley comes in today with chief complaint of Medical Management of Chronic Issues   Diagnosis and orders addressed:  1. Benign hypertensive heart disease without heart failure Low sodium diet - CMP14+EGFR - lisinopril-hydrochlorothiazide (ZESTORETIC) 20-12.5 MG tablet; TAKE 1  TABLET BY MOUTH DAILY.  Dispense: 90 tablet; Refill: 1  2. Hyperlipidemia with target LDL less than 100 Low fat diet - Lipid panel - rosuvastatin (CRESTOR) 10 MG tablet; TAKE 1 TABLET BY MOUTH ONCE A DAY  Dispense: 90 tablet; Refill: 1  3. Type 1 diabetes mellitus without complication (HCC) Continue to watch dash diet carbs in diet - Bayer DCA Hb A1c  Waived - Microalbumin / creatinine urine ratio - insulin lispro (HUMALOG) 100 UNIT/ML injection; Inject 0.7 mLs (70 Units total) into the skin daily.  Dispense: 60 mL; Refill: 5  4. BMI 31.0-31.9,adult Discussed diet and exercise for person with BMI >25 Will recheck weight in 3-6 months    Labs pending Health Maintenance reviewed Diet and exercise encouraged  Follow up plan: 3 months   Mary-Margaret Hassell Done, FNP

## 2022-07-18 ENCOUNTER — Other Ambulatory Visit (HOSPITAL_COMMUNITY): Payer: Self-pay

## 2022-07-18 LAB — CMP14+EGFR
ALT: 17 IU/L (ref 0–32)
AST: 18 IU/L (ref 0–40)
Albumin/Globulin Ratio: 2.3 — ABNORMAL HIGH (ref 1.2–2.2)
Albumin: 4.5 g/dL (ref 3.8–4.9)
Alkaline Phosphatase: 78 IU/L (ref 44–121)
BUN/Creatinine Ratio: 17 (ref 9–23)
BUN: 14 mg/dL (ref 6–24)
Bilirubin Total: <0.2 mg/dL (ref 0.0–1.2)
CO2: 24 mmol/L (ref 20–29)
Calcium: 9.1 mg/dL (ref 8.7–10.2)
Chloride: 100 mmol/L (ref 96–106)
Creatinine, Ser: 0.81 mg/dL (ref 0.57–1.00)
Globulin, Total: 2 g/dL (ref 1.5–4.5)
Glucose: 106 mg/dL — ABNORMAL HIGH (ref 70–99)
Potassium: 4 mmol/L (ref 3.5–5.2)
Sodium: 140 mmol/L (ref 134–144)
Total Protein: 6.5 g/dL (ref 6.0–8.5)
eGFR: 84 mL/min/{1.73_m2} (ref >59)

## 2022-07-18 LAB — MICROALBUMIN / CREATININE URINE RATIO
Creatinine, Urine: 183.5 mg/dL
Microalb/Creat Ratio: 22 mg/g creat (ref 0–29)
Microalbumin, Urine: 40.9 ug/mL

## 2022-07-18 LAB — LIPID PANEL
Chol/HDL Ratio: 3 ratio (ref 0.0–4.4)
Cholesterol, Total: 182 mg/dL (ref 100–199)
HDL: 61 mg/dL (ref >39)
LDL Chol Calc (NIH): 100 mg/dL — ABNORMAL HIGH (ref 0–99)
Triglycerides: 120 mg/dL (ref 0–149)
VLDL Cholesterol Cal: 21 mg/dL (ref 5–40)

## 2022-07-19 ENCOUNTER — Ambulatory Visit
Admission: RE | Admit: 2022-07-19 | Discharge: 2022-07-19 | Disposition: A | Discharge status: Home / Self Care | Payer: 59 | Source: Ambulatory Visit | Attending: Nurse Practitioner | Admitting: Nurse Practitioner

## 2022-07-19 DIAGNOSIS — R928 Other abnormal and inconclusive findings on diagnostic imaging of breast: Secondary | ICD-10-CM

## 2022-07-19 DIAGNOSIS — R92332 Mammographic heterogeneous density, left breast: Secondary | ICD-10-CM | POA: Diagnosis not present

## 2022-07-19 DIAGNOSIS — N6012 Diffuse cystic mastopathy of left breast: Secondary | ICD-10-CM | POA: Diagnosis not present

## 2022-07-26 ENCOUNTER — Other Ambulatory Visit (HOSPITAL_COMMUNITY): Payer: Self-pay

## 2022-07-27 ENCOUNTER — Other Ambulatory Visit (HOSPITAL_COMMUNITY): Payer: Self-pay

## 2022-08-04 ENCOUNTER — Other Ambulatory Visit (HOSPITAL_COMMUNITY): Payer: Self-pay

## 2022-08-14 ENCOUNTER — Encounter: Payer: Self-pay | Admitting: Nurse Practitioner

## 2022-08-14 ENCOUNTER — Ambulatory Visit: Payer: 59 | Admitting: Nurse Practitioner

## 2022-08-14 VITALS — BP 121/76 | HR 76 | Temp 97.4°F | Resp 20 | Ht 65.0 in | Wt 198.0 lb

## 2022-08-14 DIAGNOSIS — Z024 Encounter for examination for driving license: Secondary | ICD-10-CM

## 2022-08-14 NOTE — Progress Notes (Signed)
   Subjective:    Patient ID: Maria Conley, female    DOB: 04-15-1966, 56 y.o.   MRN: [id number]   Chief Complaint: dmv forms   HPI Patient is insulin dependent diabetic and frequently hs to have forms filled out ofr the DMV. Here today with paper work tocomplete.     Review of Systems  Constitutional:  Negative for diaphoresis.  Eyes:  Negative for pain.  Respiratory:  Negative for shortness of breath.   Cardiovascular:  Negative for chest pain, palpitations and leg swelling.  Gastrointestinal:  Negative for abdominal pain.  Endocrine: Negative for polydipsia.  Skin:  Negative for rash.  Neurological:  Negative for dizziness, weakness and headaches.  Hematological:  Does not bruise/bleed easily.  All other systems reviewed and are negative.      Objective:   Physical Exam Constitutional:      Appearance: Normal appearance.  Cardiovascular:     Rate and Rhythm: Normal rate and regular rhythm.     Heart sounds: Normal heart sounds.  Pulmonary:     Effort: Pulmonary effort is normal.     Breath sounds: Normal breath sounds.  Skin:    General: Skin is warm.  Neurological:     General: No focal deficit present.     Mental Status: She is alert and oriented to person, place, and time.  Psychiatric:        Mood and Affect: Mood normal.        Behavior: Behavior normal.            Assessment & Plan:   Maria Conley in today with chief complaint of dmv forms   1. Encounter for driver's license history and physical DMV forms filled and and re ready to be mailed in.    The above assessment and management plan was discussed with the patient. The patient verbalized understanding of and has agreed to the management plan. Patient is aware to call the clinic if symptoms persist or worsen. Patient is aware when to return to the clinic for a follow-up visit. Patient educated on when it is appropriate to go to the emergency department.   Mary-Margaret Daphine Deutscher,  FNP

## 2022-08-30 DIAGNOSIS — E109 Type 1 diabetes mellitus without complications: Secondary | ICD-10-CM | POA: Diagnosis not present

## 2022-08-30 DIAGNOSIS — H2513 Age-related nuclear cataract, bilateral: Secondary | ICD-10-CM | POA: Diagnosis not present

## 2022-08-30 DIAGNOSIS — H524 Presbyopia: Secondary | ICD-10-CM | POA: Diagnosis not present

## 2022-08-30 DIAGNOSIS — H52223 Regular astigmatism, bilateral: Secondary | ICD-10-CM | POA: Diagnosis not present

## 2022-08-30 DIAGNOSIS — H5213 Myopia, bilateral: Secondary | ICD-10-CM | POA: Diagnosis not present

## 2022-08-30 LAB — HM DIABETES EYE EXAM

## 2022-08-31 ENCOUNTER — Other Ambulatory Visit (HOSPITAL_COMMUNITY): Payer: Self-pay

## 2022-08-31 ENCOUNTER — Other Ambulatory Visit: Payer: Self-pay

## 2022-08-31 ENCOUNTER — Other Ambulatory Visit: Payer: Self-pay | Admitting: Nurse Practitioner

## 2022-08-31 MED ORDER — FREESTYLE LIBRE 2 SENSOR MISC
1.0000 | 5 refills | Status: DC
  Filled 2022-08-31: qty 2, 28d supply, fill #0
  Filled 2022-09-28: qty 2, 28d supply, fill #1
  Filled 2022-11-07: qty 2, 28d supply, fill #2
  Filled 2022-12-03: qty 2, 28d supply, fill #3
  Filled 2023-01-03: qty 2, 28d supply, fill #4
  Filled 2023-02-05 (×2): qty 2, 28d supply, fill #5

## 2022-09-28 ENCOUNTER — Other Ambulatory Visit (HOSPITAL_COMMUNITY): Payer: Self-pay

## 2022-10-18 ENCOUNTER — Other Ambulatory Visit (HOSPITAL_COMMUNITY): Payer: Self-pay

## 2022-10-18 ENCOUNTER — Encounter: Payer: Self-pay | Admitting: Nurse Practitioner

## 2022-10-18 ENCOUNTER — Ambulatory Visit (INDEPENDENT_AMBULATORY_CARE_PROVIDER_SITE_OTHER): Payer: Commercial Managed Care - PPO | Admitting: Nurse Practitioner

## 2022-10-18 VITALS — BP 123/73 | HR 81 | Temp 97.7°F | Resp 20 | Ht 65.0 in | Wt 201.0 lb

## 2022-10-18 DIAGNOSIS — I119 Hypertensive heart disease without heart failure: Secondary | ICD-10-CM | POA: Diagnosis not present

## 2022-10-18 DIAGNOSIS — E559 Vitamin D deficiency, unspecified: Secondary | ICD-10-CM | POA: Diagnosis not present

## 2022-10-18 DIAGNOSIS — E109 Type 1 diabetes mellitus without complications: Secondary | ICD-10-CM

## 2022-10-18 DIAGNOSIS — E785 Hyperlipidemia, unspecified: Secondary | ICD-10-CM | POA: Diagnosis not present

## 2022-10-18 DIAGNOSIS — E108 Type 1 diabetes mellitus with unspecified complications: Secondary | ICD-10-CM | POA: Diagnosis not present

## 2022-10-18 DIAGNOSIS — Z6831 Body mass index (BMI) 31.0-31.9, adult: Secondary | ICD-10-CM

## 2022-10-18 LAB — BAYER DCA HB A1C WAIVED: HB A1C (BAYER DCA - WAIVED): 7 % — ABNORMAL HIGH (ref 4.8–5.6)

## 2022-10-18 MED ORDER — ROSUVASTATIN CALCIUM 10 MG PO TABS
10.0000 mg | ORAL_TABLET | Freq: Every day | ORAL | 1 refills | Status: DC
  Filled 2022-10-18: qty 90, fill #0
  Filled 2022-12-03 – 2023-01-03 (×2): qty 90, 90d supply, fill #0

## 2022-10-18 MED ORDER — LISINOPRIL-HYDROCHLOROTHIAZIDE 20-12.5 MG PO TABS
1.0000 | ORAL_TABLET | Freq: Every day | ORAL | 1 refills | Status: DC
  Filled 2022-10-18: qty 90, 90d supply, fill #0
  Filled 2023-01-29: qty 90, 90d supply, fill #1

## 2022-10-18 MED ORDER — INSULIN LISPRO 100 UNIT/ML IJ SOLN
70.0000 [IU] | Freq: Every day | INTRAMUSCULAR | 5 refills | Status: DC
  Filled 2022-10-18: qty 60, 85d supply, fill #0
  Filled 2023-01-17 – 2023-01-29 (×2): qty 60, 85d supply, fill #1

## 2022-10-18 NOTE — Patient Instructions (Signed)

## 2022-10-18 NOTE — Progress Notes (Signed)
Subjective:    Patient ID: Maria Conley, female    DOB: 1965/09/14, 57 y.o.   MRN: [id number]   Chief Complaint: medical management of chronic issues     HPI:  Maria Conley is a 57 y.o. who identifies as a female who was assigned female at birth.   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 Hyperlipidemia with target LDL less than 100 Does try to watch diet but does not do any dedicated exercise. Lab Results  Component Value Date   CHOL 182 07/17/2022   HDL 61 07/17/2022   LDLCALC 100 (H) 07/17/2022   TRIG 120 07/17/2022   CHOLHDL 3.0 07/17/2022     2. Type 1 diabetes mellitus without complication (HCC) Has insulin pump. Blood sugars fluctuate. She does try to limit her carb intake. She stays in good range most of the time. Lab Results  Component Value Date   HGBA1C 6.1 (H) 07/17/2022     3. BMI 31.0-31.9,adult No recent weight changes Wt Readings from Last 3 Encounters:  10/18/22 201 lb (91.2 kg)  08/14/22 198 lb (89.8 kg)  07/17/22 203 lb (92.1 kg)   BMI Readings from Last 3 Encounters:  10/18/22 33.45 kg/m  08/14/22 32.95 kg/m  07/17/22 33.78 kg/m      New complaints: None today  No Known Allergies Outpatient Encounter Medications as of 10/18/2022  Medication Sig   aspirin EC 81 MG tablet Take 81 mg by mouth daily.   Blood Glucose Monitoring Suppl (FREESTYLE LITE) w/Device KIT Use as directed to check blood glucose levels.   Cholecalciferol (VITAMIN D) 50 MCG (2000 UT) tablet Take 2,000 Units by mouth daily.   Continuous Blood Gluc Sensor (FREESTYLE LIBRE 2 SENSOR) MISC Use 1 sensor every 14 days.   cyclobenzaprine (FLEXERIL) 10 MG tablet Take 1 tablet (10 mg total) by mouth 3 (three) times daily as needed for muscle spasms.   glucose blood (FREESTYLE LITE) test strip Use as directed to test blood sugar 4-5 times daily.    Insulin Infusion Pump Supplies (MINIMED INFUSION SET-MMT 399) MISC Use with insulin pump. Change infusion set q 3 days.   insulin lispro (HUMALOG) 100 UNIT/ML injection Inject 0.7 mLs (70 Units total) into the skin daily.   Lancets (FREESTYLE) lancets Use as directed to test blood sugar 4-5 times daily.   lisinopril-hydrochlorothiazide (ZESTORETIC) 20-12.5 MG tablet Take 1 tablet by mouth daily.   rosuvastatin (CRESTOR) 10 MG tablet TAKE 1 TABLET BY MOUTH ONCE A DAY   valACYclovir (VALTREX) 1000 MG tablet 2 po BID for 1 day- repeat at next flare up (Patient not taking: Reported on 07/17/2022)   No facility-administered encounter medications on file as of 10/18/2022.    Past Surgical History:  Procedure Laterality Date   ABLATION     ASD REPAIR     CESAREAN SECTION     COSMETIC SURGERY     TUBAL LIGATION      Family History  Problem Relation Age of Onset   Heart disease Mother    Hypertension Mother    Diabetes Father    Heart attack Father    Breast cancer Maternal Grandmother       Controlled substance contract: n/a     Review of Systems  Constitutional:  Negative for diaphoresis.  Eyes:  Negative for pain.  Respiratory:  Negative for shortness of breath.  Cardiovascular:  Negative for chest pain, palpitations and leg swelling.  Gastrointestinal:  Negative for abdominal pain.  Endocrine: Negative for polydipsia.  Skin:  Negative for rash.  Neurological:  Negative for dizziness, weakness and headaches.  Hematological:  Does not bruise/bleed easily.  All other systems reviewed and are negative.      Objective:   Physical Exam Vitals and nursing note reviewed.  Constitutional:      General: She is not in acute distress.    Appearance: Normal appearance. She is well-developed.  HENT:     Head: Normocephalic.     Right Ear: Tympanic membrane normal.     Left Ear: Tympanic membrane normal.     Nose: Nose normal.     Mouth/Throat:     Mouth: Mucous membranes  are moist.  Eyes:     Pupils: Pupils are equal, round, and reactive to light.  Neck:     Vascular: No carotid bruit or JVD.  Cardiovascular:     Rate and Rhythm: Normal rate and regular rhythm.     Heart sounds: Normal heart sounds.  Pulmonary:     Effort: Pulmonary effort is normal. No respiratory distress.     Breath sounds: Normal breath sounds. No wheezing or rales.  Chest:     Chest wall: No tenderness.  Abdominal:     General: Bowel sounds are normal. There is no distension or abdominal bruit.     Palpations: Abdomen is soft. There is no hepatomegaly, splenomegaly, mass or pulsatile mass.     Tenderness: There is no abdominal tenderness.  Musculoskeletal:        General: Normal range of motion.     Cervical back: Normal range of motion and neck supple.  Lymphadenopathy:     Cervical: No cervical adenopathy.  Skin:    General: Skin is warm and dry.  Neurological:     Mental Status: She is alert and oriented to person, place, and time.     Deep Tendon Reflexes: Reflexes are normal and symmetric.  Psychiatric:        Behavior: Behavior normal.        Thought Content: Thought content normal.        Judgment: Judgment normal.    BP 123/73   Pulse 81   Temp 97.7 F (36.5 C) (Temporal)   Resp 20   Ht 5' 5"$  (1.651 m)   Wt 201 lb (91.2 kg)   SpO2 100%   BMI 33.45 kg/m         Assessment & Plan:  Maria Conley in today with chief complaint of Medical Management of Chronic Issues   1. Hyperlipidemia with target LDL less than 100 Low fat diet - Lipid panel - rosuvastatin (CRESTOR) 10 MG tablet; TAKE 1 TABLET BY MOUTH ONCE A DAY  Dispense: 90 tablet; Refill: 1  2. Type 1 diabetes mellitus without complication (HCC) Continue to watch carbs in diet - Bayer DCA Hb A1c Waived - insulin lispro (HUMALOG) 100 UNIT/ML injection; Inject 0.7 mLs (70 Units total) into the skin daily.  Dispense: 60 mL; Refill: 5  3. BMI 31.0-31.9,adult Discussed diet and exercise for  person with BMI >25 Will recheck weight in 3-6 months  4. Benign hypertensive heart disease without heart failure Low sodium diet - CBC with Differential/Platelet - CMP14+EGFR - lisinopril-hydrochlorothiazide (ZESTORETIC) 20-12.5 MG tablet; Take 1 tablet by mouth daily.  Dispense: 90 tablet; Refill: 1  5. Vitamin D deficiency Continue vitamin d supplement - VITAMIN D  25 Hydroxy (Vit-D Deficiency, Fractures)    The above assessment and management plan was discussed with the patient. The patient verbalized understanding of and has agreed to the management plan. Patient is aware to call the clinic if symptoms persist or worsen. Patient is aware when to return to the clinic for a follow-up visit. Patient educated on when it is appropriate to go to the emergency department.   Mary-Margaret Hassell Done, FNP

## 2022-10-19 LAB — CMP14+EGFR
ALT: 23 IU/L (ref 0–32)
AST: 19 IU/L (ref 0–40)
Albumin/Globulin Ratio: 2.5 — ABNORMAL HIGH (ref 1.2–2.2)
Albumin: 4.7 g/dL (ref 3.8–4.9)
Alkaline Phosphatase: 79 IU/L (ref 44–121)
BUN/Creatinine Ratio: 14 (ref 9–23)
BUN: 10 mg/dL (ref 6–24)
Bilirubin Total: 0.3 mg/dL (ref 0.0–1.2)
CO2: 22 mmol/L (ref 20–29)
Calcium: 9.7 mg/dL (ref 8.7–10.2)
Chloride: 102 mmol/L (ref 96–106)
Creatinine, Ser: 0.72 mg/dL (ref 0.57–1.00)
Globulin, Total: 1.9 g/dL (ref 1.5–4.5)
Glucose: 129 mg/dL — ABNORMAL HIGH (ref 70–99)
Potassium: 4.2 mmol/L (ref 3.5–5.2)
Sodium: 142 mmol/L (ref 134–144)
Total Protein: 6.6 g/dL (ref 6.0–8.5)
eGFR: 97 mL/min/{1.73_m2} (ref >59)

## 2022-10-19 LAB — CBC WITH DIFFERENTIAL/PLATELET
Basophils Absolute: 0.1 10*3/uL (ref 0.0–0.2)
Basos: 1 %
EOS (ABSOLUTE): 0.3 10*3/uL (ref 0.0–0.4)
Eos: 4 %
Hematocrit: 44.5 % (ref 34.0–46.6)
Hemoglobin: 14.6 g/dL (ref 11.1–15.9)
Immature Grans (Abs): 0 10*3/uL (ref 0.0–0.1)
Immature Granulocytes: 0 %
Lymphocytes Absolute: 2.4 10*3/uL (ref 0.7–3.1)
Lymphs: 30 %
MCH: 28.2 pg (ref 26.6–33.0)
MCHC: 32.8 g/dL (ref 31.5–35.7)
MCV: 86 fL (ref 79–97)
Monocytes Absolute: 0.7 10*3/uL (ref 0.1–0.9)
Monocytes: 9 %
Neutrophils Absolute: 4.4 10*3/uL (ref 1.4–7.0)
Neutrophils: 56 %
Platelets: 258 10*3/uL (ref 150–450)
RBC: 5.17 x10E6/uL (ref 3.77–5.28)
RDW: 12.9 % (ref 11.7–15.4)
WBC: 7.8 10*3/uL (ref 3.4–10.8)

## 2022-10-19 LAB — LIPID PANEL
Chol/HDL Ratio: 3 ratio (ref 0.0–4.4)
Cholesterol, Total: 180 mg/dL (ref 100–199)
HDL: 61 mg/dL (ref >39)
LDL Chol Calc (NIH): 104 mg/dL — ABNORMAL HIGH (ref 0–99)
Triglycerides: 83 mg/dL (ref 0–149)
VLDL Cholesterol Cal: 15 mg/dL (ref 5–40)

## 2022-10-19 LAB — VITAMIN D 25 HYDROXY (VIT D DEFICIENCY, FRACTURES): Vit D, 25-Hydroxy: 27.7 ng/mL — ABNORMAL LOW (ref 30.0–100.0)

## 2022-11-08 ENCOUNTER — Other Ambulatory Visit (HOSPITAL_COMMUNITY): Payer: Self-pay

## 2022-11-09 ENCOUNTER — Other Ambulatory Visit (HOSPITAL_COMMUNITY): Payer: Self-pay

## 2022-11-17 ENCOUNTER — Other Ambulatory Visit: Payer: Self-pay | Admitting: Nurse Practitioner

## 2022-11-19 ENCOUNTER — Other Ambulatory Visit: Payer: Self-pay

## 2022-11-19 MED ORDER — CYCLOBENZAPRINE HCL 10 MG PO TABS
10.0000 mg | ORAL_TABLET | Freq: Three times a day (TID) | ORAL | 1 refills | Status: DC | PRN
  Filled 2022-11-19: qty 30, 10d supply, fill #0
  Filled 2023-03-31: qty 30, 10d supply, fill #1

## 2022-11-19 NOTE — Telephone Encounter (Signed)
Last office visit 10/18/22 Last refill 05/08/22, #30, no refills

## 2022-12-03 ENCOUNTER — Other Ambulatory Visit (HOSPITAL_COMMUNITY): Payer: Self-pay

## 2022-12-03 ENCOUNTER — Other Ambulatory Visit: Payer: Self-pay

## 2023-01-04 ENCOUNTER — Other Ambulatory Visit (HOSPITAL_COMMUNITY): Payer: Self-pay

## 2023-01-04 ENCOUNTER — Other Ambulatory Visit: Payer: Self-pay

## 2023-01-17 ENCOUNTER — Ambulatory Visit: Payer: Commercial Managed Care - PPO | Admitting: Nurse Practitioner

## 2023-01-18 ENCOUNTER — Other Ambulatory Visit: Payer: Self-pay

## 2023-01-22 ENCOUNTER — Ambulatory Visit: Payer: Commercial Managed Care - PPO | Admitting: Nurse Practitioner

## 2023-01-25 ENCOUNTER — Other Ambulatory Visit (HOSPITAL_COMMUNITY): Payer: Self-pay

## 2023-01-29 ENCOUNTER — Other Ambulatory Visit (HOSPITAL_COMMUNITY): Payer: Self-pay

## 2023-02-05 ENCOUNTER — Other Ambulatory Visit: Payer: Self-pay

## 2023-02-05 ENCOUNTER — Other Ambulatory Visit (HOSPITAL_COMMUNITY): Payer: Self-pay

## 2023-02-06 ENCOUNTER — Other Ambulatory Visit (HOSPITAL_COMMUNITY): Payer: Self-pay

## 2023-02-07 ENCOUNTER — Ambulatory Visit: Payer: Commercial Managed Care - PPO | Admitting: Nurse Practitioner

## 2023-02-07 ENCOUNTER — Encounter: Payer: Self-pay | Admitting: Nurse Practitioner

## 2023-02-07 ENCOUNTER — Other Ambulatory Visit: Payer: Self-pay

## 2023-02-07 VITALS — BP 125/73 | HR 76 | Temp 97.9°F | Resp 20 | Ht 65.0 in | Wt 207.0 lb

## 2023-02-07 DIAGNOSIS — E10610 Type 1 diabetes mellitus with diabetic neuropathic arthropathy: Secondary | ICD-10-CM

## 2023-02-07 DIAGNOSIS — E109 Type 1 diabetes mellitus without complications: Secondary | ICD-10-CM

## 2023-02-07 DIAGNOSIS — Z6831 Body mass index (BMI) 31.0-31.9, adult: Secondary | ICD-10-CM | POA: Diagnosis not present

## 2023-02-07 DIAGNOSIS — I119 Hypertensive heart disease without heart failure: Secondary | ICD-10-CM | POA: Diagnosis not present

## 2023-02-07 DIAGNOSIS — E785 Hyperlipidemia, unspecified: Secondary | ICD-10-CM | POA: Diagnosis not present

## 2023-02-07 LAB — BAYER DCA HB A1C WAIVED: HB A1C (BAYER DCA - WAIVED): 6 % — ABNORMAL HIGH (ref 4.8–5.6)

## 2023-02-07 MED ORDER — ROSUVASTATIN CALCIUM 10 MG PO TABS
10.0000 mg | ORAL_TABLET | Freq: Every day | ORAL | 1 refills | Status: DC
Start: 2023-02-07 — End: 2023-08-13
  Filled 2023-02-07 – 2023-03-31 (×2): qty 90, 90d supply, fill #0
  Filled 2023-07-29: qty 90, 90d supply, fill #1

## 2023-02-07 MED ORDER — INSULIN LISPRO 100 UNIT/ML IJ SOLN
70.0000 [IU] | Freq: Every day | INTRAMUSCULAR | 5 refills | Status: DC
Start: 2023-02-07 — End: 2023-08-13
  Filled 2023-02-07 – 2023-05-03 (×2): qty 60, 85d supply, fill #0
  Filled 2023-07-29: qty 60, 85d supply, fill #1

## 2023-02-07 MED ORDER — LISINOPRIL-HYDROCHLOROTHIAZIDE 20-12.5 MG PO TABS
1.0000 | ORAL_TABLET | Freq: Every day | ORAL | 1 refills | Status: DC
  Filled 2023-02-07 – 2023-05-24 (×2): qty 90, 90d supply, fill #0

## 2023-02-07 NOTE — Progress Notes (Signed)
Subjective:    Patient ID: Maria Conley, female    DOB: [birth year], 57 y.o.   MRN: [id number]   Chief Complaint: medical management of chronic issues     HPI:  BLAKELIE Conley is a 57 y.o. who identifies as a female who was assigned female at birth.   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 Benign hypertensive heart disease without heart failure No c/o chest pain, sob or headache. Does not check blood pressure at home but will occasionally check at work BP Readings from Last 3 Encounters:  10/18/22 123/73  08/14/22 121/76  07/17/22 134/79     2. Hyperlipidemia with target LDL less than 100 Does not watch diet and does nom dedicated exercise. Lab Results  Component Value Date   CHOL 180 10/18/2022   HDL 61 10/18/2022   LDLCALC 104 (H) 10/18/2022   TRIG 83 10/18/2022   CHOLHDL 3.0 10/18/2022     3. Type 1 diabetes mellitus with diabetic neuropathic arthropathy (HCC) Has insulin pump. Does well keepher blood sugars under cintrol Lab Results  Component Value Date   HGBA1C 7.0 (H) 10/18/2022     4. BMI 31.0-31.9,adult No recent weight changes Wt Readings from Last 3 Encounters:  02/07/23 207 lb (93.9 kg)  10/18/22 201 lb (91.2 kg)  08/14/22 198 lb (89.8 kg)   BMI Readings from Last 3 Encounters:  02/07/23 34.45 kg/m  10/18/22 33.45 kg/m  08/14/22 32.95 kg/m     New complaints: None today  No Known Allergies Outpatient Encounter Medications as of 02/07/2023  Medication Sig   aspirin EC 81 MG tablet Take 81 mg by mouth daily.   Blood Glucose Monitoring Suppl (FREESTYLE LITE) w/Device KIT Use as directed to check blood glucose levels.   Cholecalciferol (VITAMIN D) 50 MCG (2000 UT) tablet Take 2,000 Units by mouth daily.   Continuous Glucose Sensor (FREESTYLE LIBRE 2 SENSOR) MISC Use 1 sensor every 14 days.   cyclobenzaprine  (FLEXERIL) 10 MG tablet Take 1 tablet (10 mg total) by mouth 3 (three) times daily as needed for muscle spasms.   glucose blood (FREESTYLE LITE) test strip Use as directed to test blood sugar 4-5 times daily.   Insulin Infusion Pump Supplies (MINIMED INFUSION SET-MMT 399) MISC Use with insulin pump. Change infusion set q 3 days.   insulin lispro (HUMALOG) 100 UNIT/ML injection Inject 0.7 mLs (70 Units total) into the skin daily.   Lancets (FREESTYLE) lancets Use as directed to test blood sugar 4-5 times daily.   lisinopril-hydrochlorothiazide (ZESTORETIC) 20-12.5 MG tablet Take 1 tablet by mouth daily.   rosuvastatin (CRESTOR) 10 MG tablet Take 1 tablet (10 mg total) by mouth daily.   valACYclovir (VALTREX) 1000 MG tablet 2 po BID for 1 day- repeat at next flare up   No facility-administered encounter medications on file as of 02/07/2023.    Past Surgical History:  Procedure Laterality Date   ABLATION     ASD REPAIR     CESAREAN SECTION     COSMETIC SURGERY     TUBAL LIGATION      Family History  Problem Relation Age of Onset   Heart disease Mother    Hypertension Mother    Diabetes Father    Heart attack Father    Breast cancer Maternal Grandmother       Controlled substance contract: n/a  Review of Systems  Constitutional:  Negative for diaphoresis.  Eyes:  Negative for pain.  Respiratory:  Negative for shortness of breath.   Cardiovascular:  Negative for chest pain, palpitations and leg swelling.  Gastrointestinal:  Negative for abdominal pain.  Endocrine: Negative for polydipsia.  Skin:  Negative for rash.  Neurological:  Negative for dizziness, weakness and headaches.  Hematological:  Does not bruise/bleed easily.  All other systems reviewed and are negative.      Objective:   Physical Exam Vitals and nursing note reviewed.  Constitutional:      General: She is not in acute distress.    Appearance: Normal appearance. She is well-developed.  HENT:      Head: Normocephalic.     Right Ear: Tympanic membrane normal.     Left Ear: Tympanic membrane normal.     Nose: Nose normal.     Mouth/Throat:     Mouth: Mucous membranes are moist.     Comments: Multiple dental caries on bottom front Eyes:     Pupils: Pupils are equal, round, and reactive to light.  Neck:     Vascular: No carotid bruit or JVD.  Cardiovascular:     Rate and Rhythm: Normal rate and regular rhythm.     Heart sounds: Normal heart sounds.  Pulmonary:     Effort: Pulmonary effort is normal. No respiratory distress.     Breath sounds: Normal breath sounds. No wheezing or rales.  Chest:     Chest wall: No tenderness.  Abdominal:     General: Bowel sounds are normal. There is no distension or abdominal bruit.     Palpations: Abdomen is soft. There is no hepatomegaly, splenomegaly, mass or pulsatile mass.     Tenderness: There is no abdominal tenderness.  Musculoskeletal:        General: Normal range of motion.     Cervical back: Normal range of motion and neck supple.  Lymphadenopathy:     Cervical: No cervical adenopathy.  Skin:    General: Skin is warm and dry.  Neurological:     Mental Status: She is alert and oriented to person, place, and time.     Deep Tendon Reflexes: Reflexes are normal and symmetric.  Psychiatric:        Behavior: Behavior normal.        Thought Content: Thought content normal.        Judgment: Judgment normal.    BP 125/73   Pulse 76   Temp 97.9 F (36.6 C) (Temporal)   Resp 20   Ht 5\' 5"  (1.651 m)   Wt 207 lb (93.9 kg)   SpO2 97%   BMI 34.45 kg/m   HGBA1c 6.0%      Assessment & Plan:   Maria Conley comes in today with chief complaint of Medical Management of Chronic Issues   Diagnosis and orders addressed:  1. Benign hypertensive heart disease without heart failure Low sodium diet - CBC with Differential/Platelet - CMP14+EGFR - lisinopril-hydrochlorothiazide (ZESTORETIC) 20-12.5 MG tablet; Take 1 tablet by  mouth daily.  Dispense: 90 tablet; Refill: 1  2. Hyperlipidemia with target LDL less than 100 Low fat diet - Lipid panel - rosuvastatin (CRESTOR) 10 MG tablet; Take 1 tablet (10 mg total) by mouth daily.  Dispense: 90 tablet; Refill: 1  3. 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  Dispense: 60 mL; Refill: 5 - Bayer DCA Hb A1c Waived  4. BMI 31.0-31.9,adult Discussed diet and exercise for person with BMI >25 Will recheck weight in 3-6 months    Labs pending Health Maintenance reviewed Diet and exercise encouraged  Follow up plan: 6 months   Mary-Margaret Daphine Deutscher, FNP

## 2023-02-07 NOTE — Patient Instructions (Signed)

## 2023-02-08 ENCOUNTER — Other Ambulatory Visit (HOSPITAL_COMMUNITY): Payer: Self-pay

## 2023-02-08 LAB — CBC WITH DIFFERENTIAL/PLATELET
Basophils Absolute: 0.1 10*3/uL (ref 0.0–0.2)
Basos: 2 %
EOS (ABSOLUTE): 0.4 10*3/uL (ref 0.0–0.4)
Eos: 6 %
Hematocrit: 42.8 % (ref 34.0–46.6)
Hemoglobin: 14.1 g/dL (ref 11.1–15.9)
Immature Grans (Abs): 0 10*3/uL (ref 0.0–0.1)
Immature Granulocytes: 0 %
Lymphocytes Absolute: 1.8 10*3/uL (ref 0.7–3.1)
Lymphs: 28 %
MCH: 28.1 pg (ref 26.6–33.0)
MCHC: 32.9 g/dL (ref 31.5–35.7)
MCV: 85 fL (ref 79–97)
Monocytes Absolute: 0.5 10*3/uL (ref 0.1–0.9)
Monocytes: 7 %
Neutrophils Absolute: 3.8 10*3/uL (ref 1.4–7.0)
Neutrophils: 57 %
Platelets: 234 10*3/uL (ref 150–450)
RBC: 5.01 x10E6/uL (ref 3.77–5.28)
RDW: 12.9 % (ref 11.7–15.4)
WBC: 6.6 10*3/uL (ref 3.4–10.8)

## 2023-02-08 LAB — CMP14+EGFR
ALT: 12 IU/L (ref 0–32)
AST: 16 IU/L (ref 0–40)
Albumin/Globulin Ratio: 2.1 (ref 1.2–2.2)
Albumin: 4.2 g/dL (ref 3.8–4.9)
Alkaline Phosphatase: 81 IU/L (ref 44–121)
BUN/Creatinine Ratio: 14 (ref 9–23)
BUN: 11 mg/dL (ref 6–24)
Bilirubin Total: 0.3 mg/dL (ref 0.0–1.2)
CO2: 28 mmol/L (ref 20–29)
Calcium: 9.5 mg/dL (ref 8.7–10.2)
Chloride: 100 mmol/L (ref 96–106)
Creatinine, Ser: 0.77 mg/dL (ref 0.57–1.00)
Globulin, Total: 2 g/dL (ref 1.5–4.5)
Glucose: 152 mg/dL — ABNORMAL HIGH (ref 70–99)
Potassium: 3.8 mmol/L (ref 3.5–5.2)
Sodium: 139 mmol/L (ref 134–144)
Total Protein: 6.2 g/dL (ref 6.0–8.5)
eGFR: 89 mL/min/{1.73_m2} (ref >59)

## 2023-02-08 LAB — LIPID PANEL
Chol/HDL Ratio: 2.6 ratio (ref 0.0–4.4)
Cholesterol, Total: 169 mg/dL (ref 100–199)
HDL: 65 mg/dL (ref >39)
LDL Chol Calc (NIH): 93 mg/dL (ref 0–99)
Triglycerides: 58 mg/dL (ref 0–149)
VLDL Cholesterol Cal: 11 mg/dL (ref 5–40)

## 2023-03-03 ENCOUNTER — Other Ambulatory Visit: Payer: Self-pay | Admitting: Nurse Practitioner

## 2023-03-04 ENCOUNTER — Other Ambulatory Visit (HOSPITAL_COMMUNITY): Payer: Self-pay

## 2023-03-04 MED ORDER — FREESTYLE LIBRE 2 SENSOR MISC
1.0000 | 3 refills | Status: DC
  Filled 2023-03-04 – 2023-03-06 (×4): qty 6, 84d supply, fill #0
  Filled 2023-05-11: qty 6, 84d supply, fill #1
  Filled 2023-07-15 – 2023-07-30 (×2): qty 6, 84d supply, fill #2
  Filled 2023-10-19: qty 6, 84d supply, fill #3

## 2023-03-05 IMAGING — MG MM DIGITAL SCREENING BILAT W/ TOMO AND CAD
6 of 10 series · 6 of 30 positions shown · non-contrast
Comparison: Previous exam(s).

CLINICAL DATA: Screening.

EXAM:
DIGITAL SCREENING BILATERAL MAMMOGRAM WITH TOMOSYNTHESIS AND CAD
TECHNIQUE: Bilateral screening digital craniocaudal and mediolateral oblique
mammograms were obtained. Bilateral screening digital breast
tomosynthesis was performed. The images were evaluated with
computer-aided detection.

[R MLO synth-2D]
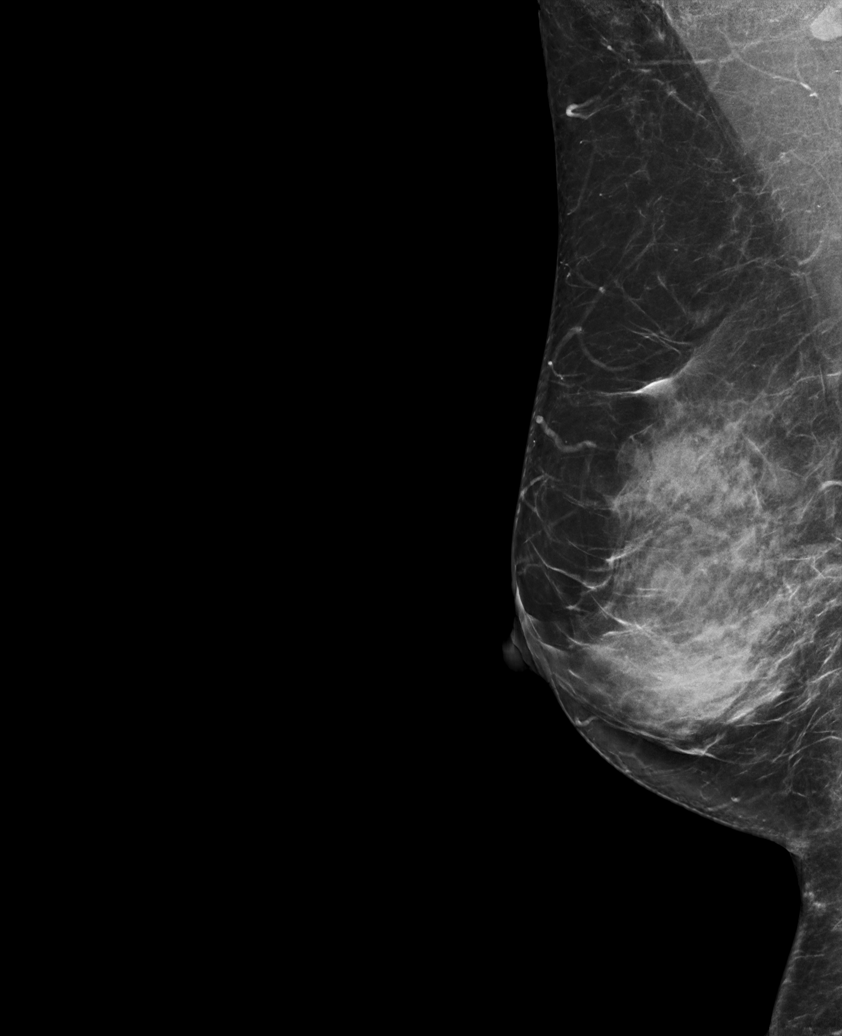

[L MLO synth-2D]
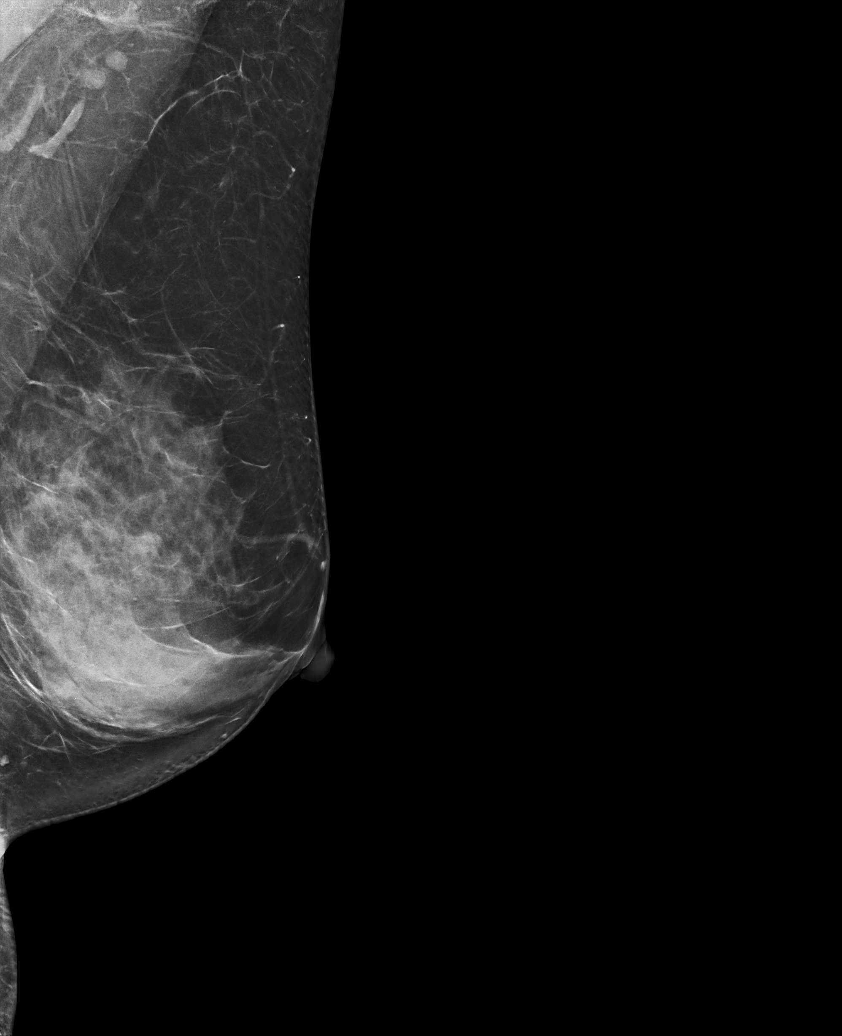

[L CC synth-2D]
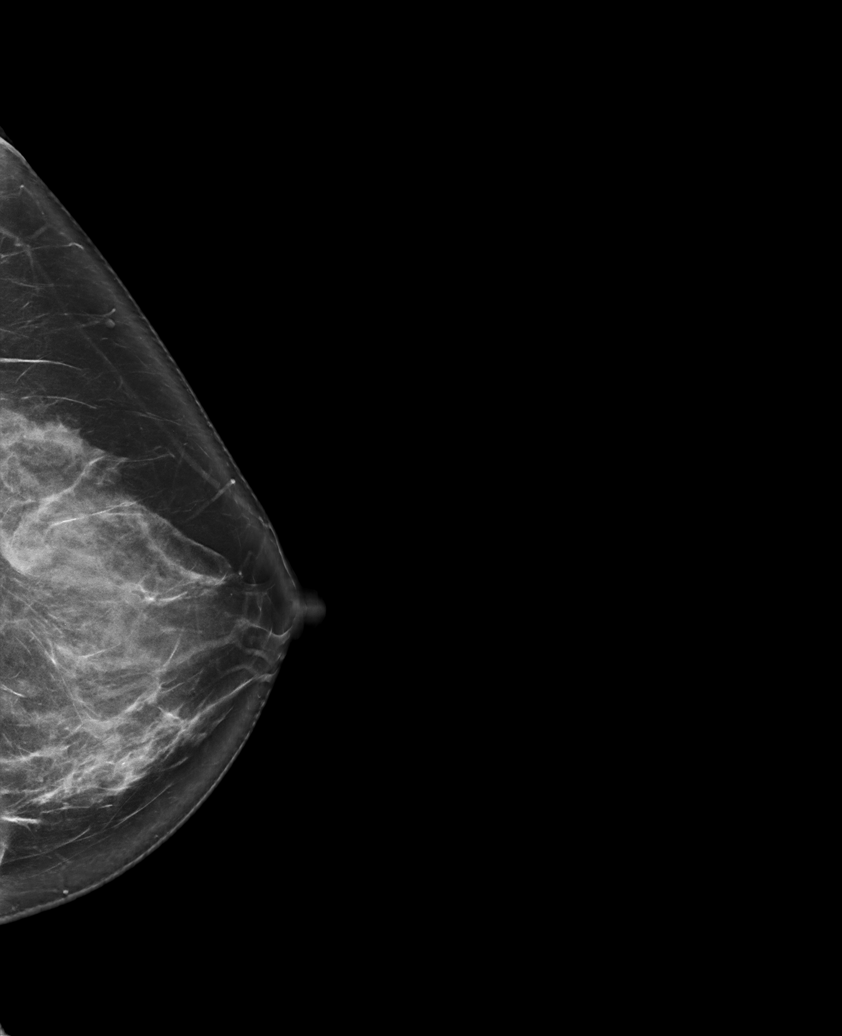

[R CC synth-2D]
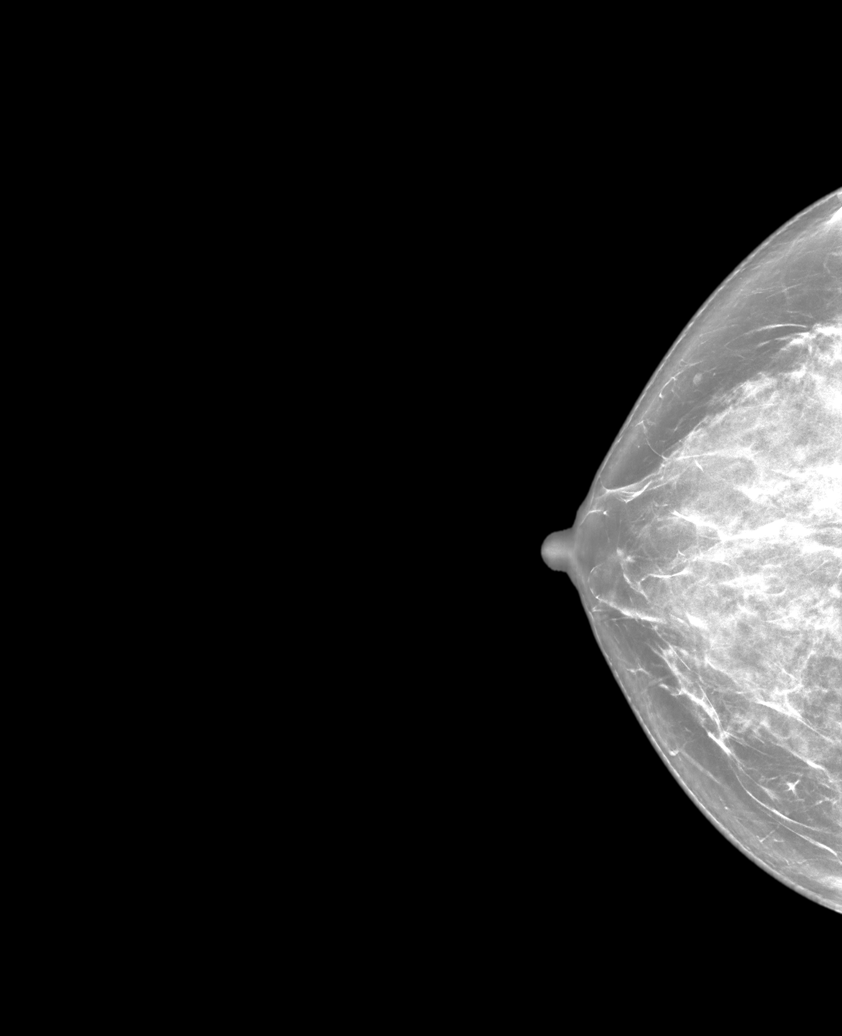

[R XCCL synth-2D]
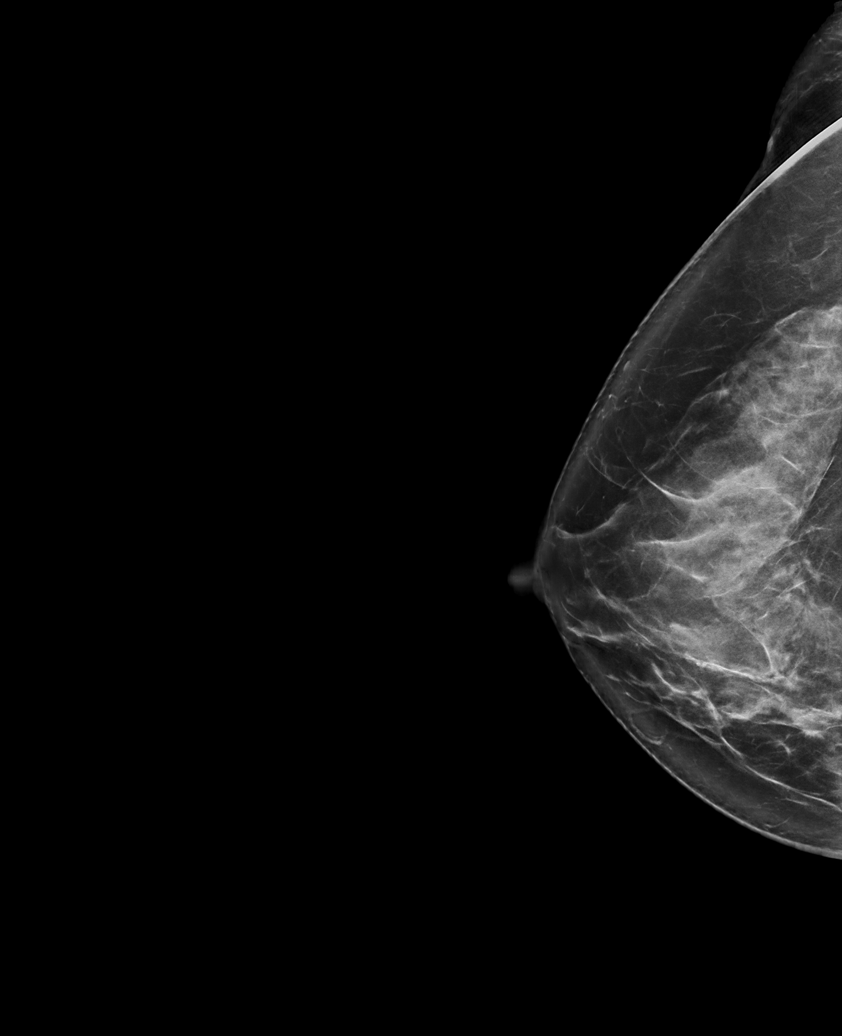

[L CC tomo · tomo slice 43/85.0]
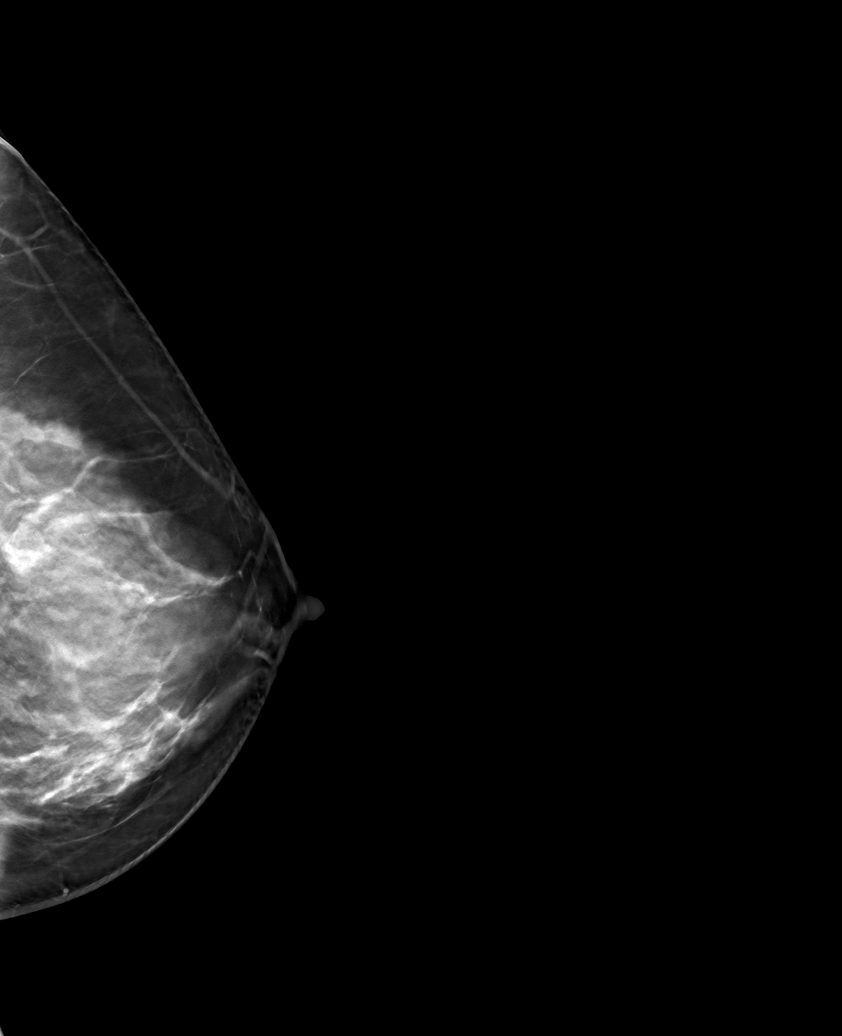

[6 of 30 positions shown; findings below may reference images not displayed]

ACR Breast Density Category d: The breast tissue is extremely dense,
which lowers the sensitivity of mammography
FINDINGS: There are no findings suspicious for malignancy.
IMPRESSION: No mammographic evidence of malignancy. A result letter of this
screening mammogram will be mailed directly to the patient.

RECOMMENDATION:
Screening mammogram in one year. (Code:TA-V-WV9)

BI-RADS CATEGORY  1: Negative.

## 2023-03-06 ENCOUNTER — Other Ambulatory Visit (HOSPITAL_COMMUNITY): Payer: Self-pay

## 2023-04-01 ENCOUNTER — Other Ambulatory Visit (HOSPITAL_COMMUNITY): Payer: Self-pay

## 2023-04-11 DIAGNOSIS — E109 Type 1 diabetes mellitus without complications: Secondary | ICD-10-CM | POA: Diagnosis not present

## 2023-04-11 DIAGNOSIS — E108 Type 1 diabetes mellitus with unspecified complications: Secondary | ICD-10-CM | POA: Diagnosis not present

## 2023-05-03 ENCOUNTER — Other Ambulatory Visit (HOSPITAL_COMMUNITY): Payer: Self-pay

## 2023-05-10 ENCOUNTER — Other Ambulatory Visit (HOSPITAL_COMMUNITY): Payer: Self-pay

## 2023-05-10 ENCOUNTER — Ambulatory Visit: Payer: Commercial Managed Care - PPO | Admitting: Nurse Practitioner

## 2023-05-10 ENCOUNTER — Encounter: Payer: Self-pay | Admitting: Nurse Practitioner

## 2023-05-10 ENCOUNTER — Ambulatory Visit (INDEPENDENT_AMBULATORY_CARE_PROVIDER_SITE_OTHER): Payer: Commercial Managed Care - PPO

## 2023-05-10 VITALS — BP 125/77 | HR 85 | Temp 97.4°F | Resp 20 | Ht 65.0 in | Wt 204.0 lb

## 2023-05-10 DIAGNOSIS — M7752 Other enthesopathy of left foot: Secondary | ICD-10-CM | POA: Diagnosis not present

## 2023-05-10 DIAGNOSIS — M19072 Primary osteoarthritis, left ankle and foot: Secondary | ICD-10-CM | POA: Diagnosis not present

## 2023-05-10 DIAGNOSIS — M79672 Pain in left foot: Secondary | ICD-10-CM

## 2023-05-10 DIAGNOSIS — M722 Plantar fascial fibromatosis: Secondary | ICD-10-CM | POA: Diagnosis not present

## 2023-05-10 MED ORDER — PREDNISONE 10 MG (21) PO TBPK
ORAL_TABLET | ORAL | 0 refills | Status: DC
  Filled 2023-05-10: qty 21, 6d supply, fill #0

## 2023-05-10 NOTE — Progress Notes (Signed)
Subjective:    Patient ID: Maria Conley, female    DOB: 21-Jun-1966, 57 y.o.   MRN: [id number]   Chief Complaint: Foot and heel pain (Started 3 months ago. Left foot/)   HPI  Patient in  today c/o left foot and heel pain. Started over 3 moths ago. No injury. Rates pain 2/10. Pain increases with  weight bearing. Patient Active Problem List   Diagnosis Date Noted   BMI 31.0-31.9,adult 06/10/2015   Hyperlipidemia with target LDL less than 100 07/08/2013   Type 1 diabetes mellitus (HCC) 11/10/2012   Benign hypertensive heart disease without heart failure 11/10/2012   S/P atrial septal defect closure 11/10/2012       Review of Systems  Constitutional:  Negative for diaphoresis.  Eyes:  Negative for pain.  Respiratory:  Negative for shortness of breath.   Cardiovascular:  Negative for chest pain, palpitations and leg swelling.  Gastrointestinal:  Negative for abdominal pain.  Endocrine: Negative for polydipsia.  Skin:  Negative for rash.  Neurological:  Negative for dizziness, weakness and headaches.  Hematological:  Does not bruise/bleed easily.  All other systems reviewed and are negative.      Objective:   Physical Exam Vitals and nursing note reviewed.  Constitutional:      General: She is not in acute distress.    Appearance: Normal appearance. She is well-developed.  Neck:     Vascular: No carotid bruit or JVD.  Cardiovascular:     Rate and Rhythm: Normal rate and regular rhythm.     Heart sounds: Normal heart sounds.  Pulmonary:     Effort: Pulmonary effort is normal. No respiratory distress.     Breath sounds: Normal breath sounds. No wheezing or rales.  Chest:     Chest wall: No tenderness.  Abdominal:     General: Bowel sounds are normal. There is no distension or abdominal bruit.     Palpations: Abdomen is soft. There is no hepatomegaly, splenomegaly, mass or pulsatile mass.     Tenderness: There is no abdominal tenderness.  Musculoskeletal:         General: Normal range of motion.     Cervical back: Normal range of motion and neck supple.  Lymphadenopathy:     Cervical: No cervical adenopathy.  Skin:    General: Skin is warm and dry.  Neurological:     Mental Status: She is alert and oriented to person, place, and time.     Deep Tendon Reflexes: Reflexes are normal and symmetric.  Psychiatric:        Behavior: Behavior normal.        Thought Content: Thought content normal.        Judgment: Judgment normal.    BP 125/77   Pulse 85   Temp (!) 97.4 F (36.3 C) (Temporal)   Resp 20   Ht 5\' 5"  (1.651 m)   Wt 204 lb (92.5 kg)   SpO2 96%   BMI 33.95 kg/m    Foot xray- small heel spur-Preliminary reading by Paulene Floor, FNP  [facility]      Assessment & Plan:  Maria Conley in today with chief complaint of Foot and heel pain (Started 3 months ago. Left foot/)   1. Left foot pain - DG Foot Complete Left  2. Plantar fasciitis of left foot Roll foot over a frozen coke bottle Rest' RTO prn  Meds ordered this encounter  Medications   predniSONE (STERAPRED UNI-PAK 21 TAB) 10 MG (21)  TBPK tablet    Sig: As directed x 6 days    Dispense:  21 tablet    Refill:  0    Order Specific Question:   Supervising Provider    Answer:   Arville Care A [[id number]]       The above assessment and management plan was discussed with the patient. The patient verbalized understanding of and has agreed to the management plan. Patient is aware to call the clinic if symptoms persist or worsen. Patient is aware when to return to the clinic for a follow-up visit. Patient educated on when it is appropriate to go to the emergency department.   Mary-Margaret Daphine Deutscher, FNP

## 2023-05-10 NOTE — Patient Instructions (Signed)
Plantar Fasciitis  Plantar fasciitis is a painful foot condition that affects the heel. It occurs when the band of tissue that connects the toes to the heel bone (plantar fascia) becomes irritated. This can happen as the result of exercising too much or doing other repetitive activities (overuse injury). Plantar fasciitis can cause mild irritation to severe pain that makes it difficult to walk or move. The pain is usually worse in the morning after sleeping, or after sitting or lying down for a period of time. Pain may also be worse after long periods of walking or standing. What are the causes? This condition may be caused by: Standing for long periods of time. Wearing shoes that do not have good arch support. Doing activities that put stress on joints (high-impact activities). This includes ballet and exercise that makes your heart beat faster (aerobic exercise), such as running. Being overweight. An abnormal way of walking (gait). Tight muscles in the back of your lower leg (calf). High arches in your feet or flat feet. Starting a new athletic activity. What are the signs or symptoms? The main symptom of this condition is heel pain. Pain may get worse after the following: Taking the first steps after a time of rest, especially in the morning after awakening, or after you have been sitting or lying down for a while. Long periods of standing still. Pain may decrease after 30-45 minutes of activity, such as gentle walking. How is this diagnosed? This condition may be diagnosed based on your medical history, a physical exam, and your symptoms. Your health care provider will check for: A tender area on the bottom of your foot. A high arch in your foot or flat feet. Pain when you move your foot. Difficulty moving your foot. You may have imaging tests to confirm the diagnosis, such as: X-rays. Ultrasound. MRI. How is this treated? Treatment for plantar fasciitis depends on how severe your  condition is. Treatment may include: Rest, ice, pressure (compression), and raising (elevating) the affected foot. This is called RICE therapy. Your health care provider may recommend RICE therapy along with over-the-counter pain medicines to manage your pain. Exercises to stretch your calves and your plantar fascia. A splint that holds your foot in a stretched, upward position while you sleep (night splint). Physical therapy to relieve symptoms and prevent problems in the future. Injections of steroid medicine (cortisone) to relieve pain and inflammation. Stimulating your plantar fascia with electrical impulses (extracorporeal shock wave therapy). This is usually the last treatment option before surgery. Surgery, if other treatments have not worked after 12 months. Follow these instructions at home: Managing pain, stiffness, and swelling  If directed, put ice on the painful area. To do this: Put ice in a plastic bag, or use a frozen bottle of water. Place a towel between your skin and the bag or bottle. Roll the bottom of your foot over the bag or bottle. Do this for 20 minutes, 2-3 times a day. Wear athletic shoes that have air-sole or gel-sole cushions, or try soft shoe inserts that are designed for plantar fasciitis. Elevate your foot above the level of your heart while you are sitting or lying down. Activity Avoid activities that cause pain. Ask your health care provider what activities are safe for you. Do physical therapy exercises and stretches as told by your health care provider. Try activities and forms of exercise that are easier on your joints (low impact). Examples include swimming, water aerobics, and biking. General instructions Take over-the-counter   and prescription medicines only as told by your health care provider. Wear a night splint while sleeping, if told by your health care provider. Loosen the splint if your toes tingle, become numb, or turn cold and blue. Maintain a  healthy weight, or work with your health care provider to lose weight as needed. Keep all follow-up visits. This is important. Contact a health care provider if you have: Symptoms that do not go away with home treatment. Pain that gets worse. Pain that affects your ability to move or do daily activities. Summary Plantar fasciitis is a painful foot condition that affects the heel. It occurs when the band of tissue that connects the toes to the heel bone (plantar fascia) becomes irritated. Heel pain is the main symptom of this condition. It may get worse after exercising too much or standing still for a long time. Treatment varies, but it usually starts with rest, ice, pressure (compression), and raising (elevating) the affected foot. This is called RICE therapy. Over-the-counter medicines can also be used to manage pain. This information is not intended to replace advice given to you by your health care provider. Make sure you discuss any questions you have with your health care provider. Document Revised: 12/07/2019 Document Reviewed: 12/07/2019 Elsevier Patient Education  2024 [employer]  

## 2023-05-11 ENCOUNTER — Other Ambulatory Visit (HOSPITAL_COMMUNITY): Payer: Self-pay

## 2023-05-14 ENCOUNTER — Other Ambulatory Visit: Payer: Self-pay | Admitting: Nurse Practitioner

## 2023-05-14 DIAGNOSIS — M79672 Pain in left foot: Secondary | ICD-10-CM

## 2023-05-15 ENCOUNTER — Other Ambulatory Visit (HOSPITAL_COMMUNITY): Payer: Self-pay

## 2023-05-21 ENCOUNTER — Ambulatory Visit: Payer: Commercial Managed Care - PPO | Admitting: Orthopedic Surgery

## 2023-05-21 ENCOUNTER — Encounter: Payer: Self-pay | Admitting: Orthopedic Surgery

## 2023-05-21 VITALS — BP 116/75 | HR 83 | Ht 65.0 in | Wt 205.0 lb

## 2023-05-21 DIAGNOSIS — M722 Plantar fascial fibromatosis: Secondary | ICD-10-CM

## 2023-05-21 NOTE — Progress Notes (Signed)
New Patient Visit  Assessment: Maria Conley is a 57 y.o. female with the following: 1. Plantar fasciitis of left foot  Plan: Maria Conley has pain in the plantar aspect of the left heel.  Presentation is consistent with plantar fasciitis.  She has tried medications, nighttime splinting, stretches and topical treatments.  She is interested in a steroid injection.  This was completed in clinic today.  She will follow-up as needed.  Procedure note injection - Left plantar heel  Verbal consent was obtained to inject the Left plantar heel Timeout was completed to confirm the site of injection.  The skin was prepped with alcohol and ethyl chloride was sprayed at the injection site.  A 21-gauge needle was used to inject 40 mg of Depo-Medrol and 1% lidocaine (1 cc) into the Left heel using a medial approach.  There were no complications. Patient tolerated the procedure well. A sterile bandage was applied   Follow-up: Return if symptoms worsen or fail to improve.  Subjective:  Chief Complaint  Patient presents with   Foot Pain    L heel pain for 3 mos. Has tried splints, exercises, rubs, and meds.     History of Present Illness: Maria Conley is a 57 y.o. female who has been referred by Bennie Pierini, FNP for evaluation of left heel pain.  She has had pain in the left heel for about 3 months.  She has had plantar fasciitis in the right heel before, but this resolved.  No specific onset.  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  She has worn splints at nighttime, with limited improvement in her symptoms.  She has tried medications, including a recent steroid Dosepak.  While taking the steroids, she noted improvement, but the pain has returned.  She has tried exercises.  She is using topical treatments.  She is yet to have an injection.  For step in the morning is very painful.  She is now noting that she is walking differently, and started to experience  additional pains in her lower leg.   Review of Systems: No fevers or chills No numbness or tingling No chest pain No shortness of breath No bowel or bladder dysfunction No GI distress No headaches   Medical History:  Past Medical History:  Diagnosis Date   Diabetes mellitus without complication (HCC)    Hypertension    Systolic murmur    Vitamin D deficiency     Past Surgical History:  Procedure Laterality Date   ABLATION     ASD REPAIR     CESAREAN SECTION     COSMETIC SURGERY     TUBAL LIGATION      Family History  Problem Relation Age of Onset   Heart disease Mother    Hypertension Mother    Diabetes Father    Heart attack Father    Breast cancer Maternal Grandmother    Social History   Tobacco Use   Smoking status: Former    Current packs/day: 0.00    Average packs/day: 0.3 packs/day for 10.0 years (3.0 ttl pk-yrs)    Types: Cigarettes    Start date: 01/02/2003    Quit date: 01/01/2013    Years since quitting: 10.3   Smokeless tobacco: Never  Vaping Use   Vaping status: Never Used  Substance Use Topics   Alcohol use: No    Alcohol/week: 0.0 standard drinks of alcohol   Drug use: No    No Known Allergies  Current Meds  Medication Sig   aspirin EC 81 MG tablet Take 81 mg by mouth daily.   Blood Glucose Monitoring Suppl (FREESTYLE LITE) w/Device KIT Use as directed to check blood glucose levels.   Cholecalciferol (VITAMIN D) 50 MCG (2000 UT) tablet Take 2,000 Units by mouth daily.   Continuous Glucose Sensor (FREESTYLE LIBRE 2 SENSOR) MISC Use 1 sensor every 14 days.   cyclobenzaprine (FLEXERIL) 10 MG tablet Take 1 tablet (10 mg total) by mouth 3 (three) times daily as needed for muscle spasms.   glucose blood (FREESTYLE LITE) test strip Use as directed to test blood sugar 4-5 times daily.   Insulin Infusion Pump Supplies (MINIMED INFUSION SET-MMT 399) MISC Use with insulin pump. Change infusion set q 3 days.   insulin lispro (HUMALOG) 100 UNIT/ML  injection Inject 0.7 mLs (70 Units total) into the skin daily.   Lancets (FREESTYLE) lancets Use as directed to test blood sugar 4-5 times daily.   lisinopril-hydrochlorothiazide (ZESTORETIC) 20-12.5 MG tablet Take 1 tablet by mouth daily.   rosuvastatin (CRESTOR) 10 MG tablet Take 1 tablet (10 mg total) by mouth daily.   valACYclovir (VALTREX) 1000 MG tablet 2 po BID for 1 day- repeat at next flare up    Objective: BP 116/75   Pulse 83   Ht 5\' 5"  (1.651 m)   Wt 205 lb (93 kg)   BMI 34.11 kg/m   Physical Exam:  General: Alert and oriented. and No acute distress. Gait: Left sided antalgic gait.  Evaluation of left foot demonstrates no bruising.  No deformity.  No redness.  Tenderness to palpation in the plantar heel.  3-5 degrees of dorsiflexion with the knee fully extended.  15 degrees of dorsiflexion with the knee bent.  Toes are warm and well-perfused.  Plantar fascia is palpable.  IMAGING: I personally reviewed images previously obtained in clinic  X-rays of the left foot were previously obtained.  Small plantar calcaneal spur.  No additional injuries.   New Medications:  No orders of the defined types were placed in this encounter.     Oliver Barre, MD  05/21/2023 1:22 PM

## 2023-05-21 NOTE — Patient Instructions (Signed)
Instructions Following Joint Injections  In clinic today, you received an injection in one of your joints (sometimes more than one).  Occasionally, you can have some pain at the injection site, this is normal.  You can place ice at the injection site, or take over-the-counter medications such as Tylenol (acetaminophen) or Advil (ibuprofen).  Please follow all directions listed on the bottle.  If your joint (knee or shoulder) becomes swollen, red or very painful, please contact the clinic for additional assistance.   Two medications were injected, including lidocaine and a steroid (often referred to as cortisone).  Lidocaine is effective almost immediately but wears off quickly.  However, the steroid can take a few days to improve your symptoms.  In some cases, it can make your pain worse for a couple of days.  Do not be concerned if this happens as it is common.  You can apply ice or take some over-the-counter medications as needed.   Injections in the same joint cannot be repeated for 3 months.  This helps to limit the risk of an infection in the joint.  If you were to develop an infection in your joint, the best treatment option would be surgery.     Plantar Fasciitis Rehab Ask your health care provider which exercises are safe for you. Do exercises exactly as told by your health care provider and adjust them as directed. It is normal to feel mild stretching, pulling, tightness, or discomfort as you do these exercises. Stop right away if you feel sudden pain or your pain gets worse. Do not begin these exercises until told by your health care provider.   Stretching and range-of-motion exercises These exercises warm up your muscles and joints and improve the movement and flexibility of your foot. These exercises also help to relieve pain.   Plantar fascia stretch  Sit with your left / right leg crossed over your opposite knee. Hold your heel with one hand with that thumb near your arch. With  your other hand, hold your toes and gently pull them back toward the top of your foot. You should feel a stretch on the base (bottom) of your toes, or the bottom of your foot (plantar fascia), or both. Hold this stretch 10 seconds. Slowly release your toes and return to the starting position. Repeat 10 times. Complete this exercise 1-2 times a day.   Gastrocnemius stretch, standing This exercise is also called a calf (gastroc) stretch. It stretches the muscles in the back of the upper calf. Stand with your hands against a wall. Extend your left / right leg behind you, and bend your front knee slightly. Keeping your heels on the floor, your toes facing forward, and your back knee straight, shift your weight toward the wall. Do not arch your back. You should feel a gentle stretch in your upper calf. Hold this position for 10 seconds. Repeat 10 times. Complete this exercise 1-2 times a day.   Soleus stretch, standing This exercise is also called a calf (soleus) stretch. It stretches the muscles in the back of the lower calf. Stand with your hands against a wall. Extend your left / right leg behind you, and bend your front knee slightly. Keeping your heels on the floor and your toes facing forward, bend your back knee and shift your weight slightly over your back leg. You should feel a gentle stretch deep in your lower calf. Hold this position for 10 seconds. Repeat 10 times. Complete this exercise 1-2 times a  day.   Gastroc and soleus stretch, standing step This exercise stretches the muscles in the back of the lower leg. These muscles are in the upper calf (gastrocnemius) and the lower calf (soleus). Stand with the ball of your left / right foot on the front of a step. The ball of your foot is on the walking surface, right under your toes. Keep your other foot firmly on the same step. Hold on to the wall or a railing for balance. Slowly lift your other foot, allowing your body weight to  press your heel down over the edge of the front of the step. Keep knee straight and unbent. You should feel a stretch in your calf. Hold this position for 10 seconds. Return both feet to the step. Repeat this exercise with a slight bend in your left / right knee. Repeat 10 times with your left / right knee straight and 10 times with your left / right knee bent. Complete this exercise 1-2 times a day.   Balance exercise This exercise builds your balance and strength control of your arch to help take pressure off your plantar fascia.   Single leg stand If this exercise is too easy, you can try it with your eyes closed or while standing on a pillow. Without shoes, stand near a railing or in a doorway. You may hold on to the railing or door frame as needed. Stand on your left / right foot. Keep your big toe down on the floor and lift the arch of your foot. You should feel a stretch across the bottom of your foot and your arch. Do not let your foot roll inward. Hold this position for 10 seconds. Repeat 10 times. Complete this exercise 1-2 times a day. This information is not intended to replace advice given to you by your health care provider. Make sure you discuss any questions you have with your health care provider.

## 2023-05-24 ENCOUNTER — Other Ambulatory Visit (HOSPITAL_COMMUNITY): Payer: Self-pay

## 2023-05-27 ENCOUNTER — Other Ambulatory Visit: Payer: Self-pay

## 2023-06-13 ENCOUNTER — Telehealth: Payer: Self-pay | Admitting: Nurse Practitioner

## 2023-06-13 DIAGNOSIS — Z0279 Encounter for issue of other medical certificate: Secondary | ICD-10-CM

## 2023-06-13 NOTE — Telephone Encounter (Signed)
Information completed and forwarded to PCP 

## 2023-06-13 NOTE — Telephone Encounter (Signed)
[employer] faxed FMLA forms to be completed.   Form Fee Paid? (Y/N)  Y          If NO, form is placed on front office manager desk to hold until payment received. If YES, then form will be placed in the RX/HH Nurse Coordinators box for completion.  Form will not be processed until payment is received

## 2023-06-25 NOTE — Telephone Encounter (Signed)
PCP completed and signed FMLA forms. They have been faxed to [employer] at fax number [phone]. LMOVM and informed they are complete.

## 2023-07-15 ENCOUNTER — Other Ambulatory Visit: Payer: Self-pay | Admitting: Nurse Practitioner

## 2023-07-16 ENCOUNTER — Other Ambulatory Visit: Payer: Self-pay

## 2023-07-16 ENCOUNTER — Other Ambulatory Visit (HOSPITAL_COMMUNITY): Payer: Self-pay

## 2023-07-16 MED ORDER — CYCLOBENZAPRINE HCL 10 MG PO TABS
10.0000 mg | ORAL_TABLET | Freq: Three times a day (TID) | ORAL | 0 refills | Status: DC | PRN
  Filled 2023-07-16: qty 30, 10d supply, fill #0

## 2023-07-17 ENCOUNTER — Other Ambulatory Visit: Payer: Self-pay

## 2023-07-29 ENCOUNTER — Other Ambulatory Visit: Payer: Self-pay

## 2023-07-30 ENCOUNTER — Other Ambulatory Visit: Payer: Self-pay

## 2023-07-31 DIAGNOSIS — E108 Type 1 diabetes mellitus with unspecified complications: Secondary | ICD-10-CM | POA: Diagnosis not present

## 2023-07-31 DIAGNOSIS — E109 Type 1 diabetes mellitus without complications: Secondary | ICD-10-CM | POA: Diagnosis not present

## 2023-08-09 ENCOUNTER — Ambulatory Visit: Payer: Commercial Managed Care - PPO | Admitting: Nurse Practitioner

## 2023-08-13 ENCOUNTER — Ambulatory Visit: Payer: Commercial Managed Care - PPO | Admitting: Nurse Practitioner

## 2023-08-13 ENCOUNTER — Other Ambulatory Visit: Payer: Self-pay

## 2023-08-13 ENCOUNTER — Other Ambulatory Visit (HOSPITAL_COMMUNITY): Payer: Self-pay

## 2023-08-13 VITALS — BP 134/80 | HR 89 | Temp 97.2°F | Resp 20 | Ht 65.0 in | Wt 201.0 lb

## 2023-08-13 DIAGNOSIS — I119 Hypertensive heart disease without heart failure: Secondary | ICD-10-CM | POA: Diagnosis not present

## 2023-08-13 DIAGNOSIS — E10610 Type 1 diabetes mellitus with diabetic neuropathic arthropathy: Secondary | ICD-10-CM

## 2023-08-13 DIAGNOSIS — Z6831 Body mass index (BMI) 31.0-31.9, adult: Secondary | ICD-10-CM | POA: Diagnosis not present

## 2023-08-13 DIAGNOSIS — E109 Type 1 diabetes mellitus without complications: Secondary | ICD-10-CM | POA: Diagnosis not present

## 2023-08-13 DIAGNOSIS — E785 Hyperlipidemia, unspecified: Secondary | ICD-10-CM | POA: Diagnosis not present

## 2023-08-13 DIAGNOSIS — B009 Herpesviral infection, unspecified: Secondary | ICD-10-CM | POA: Diagnosis not present

## 2023-08-13 LAB — BAYER DCA HB A1C WAIVED: HB A1C (BAYER DCA - WAIVED): 5.9 % — ABNORMAL HIGH (ref 4.8–5.6)

## 2023-08-13 MED ORDER — LISINOPRIL-HYDROCHLOROTHIAZIDE 20-12.5 MG PO TABS
1.0000 | ORAL_TABLET | Freq: Every day | ORAL | 1 refills | Status: DC
  Filled 2023-08-13: qty 90, 90d supply, fill #0
  Filled 2023-12-29: qty 90, 90d supply, fill #1

## 2023-08-13 MED ORDER — VALACYCLOVIR HCL 1 G PO TABS
2000.0000 mg | ORAL_TABLET | Freq: Two times a day (BID) | ORAL | 1 refills | Status: AC
  Filled 2023-08-13: qty 12, 3d supply, fill #0

## 2023-08-13 MED ORDER — ROSUVASTATIN CALCIUM 10 MG PO TABS
10.0000 mg | ORAL_TABLET | Freq: Every day | ORAL | 1 refills | Status: DC
  Filled 2023-08-13 – 2023-10-19 (×2): qty 90, 90d supply, fill #0
  Filled 2024-01-24: qty 90, 90d supply, fill #1

## 2023-08-13 MED ORDER — INSULIN LISPRO 100 UNIT/ML IJ SOLN
70.0000 [IU] | Freq: Every day | INTRAMUSCULAR | 5 refills | Status: DC
  Filled 2023-08-13 – 2023-10-19 (×2): qty 60, 85d supply, fill #0
  Filled 2024-01-24: qty 60, 85d supply, fill #1

## 2023-08-13 NOTE — Patient Instructions (Signed)
 Cold Sore    A cold sore, also called a fever blister, is a small, fluid-filled sore that forms inside of the mouth or on the lips, gums, nose, chin, or cheeks. Cold sores can spread to other parts of the body, such as the eyes, fingers, or genitals.  Cold sores can spread from person to person (are contagious) until the sores crust over completely. Most cold sores go away within 2 weeks.  What are the causes?  Cold sores are caused by a virus (herpes simplex virus type 1, HSV-1). The virus can spread from person to person through close contact, such as through:  Kissing.  Touching the affected area.  Sharing personal items such as lip balm, razors, a drinking glass, or eating utensils.  What increases the risk?  Being tired, stressed, or sick.  Having your period (menstruating).  Being pregnant.  Taking certain medicines.  Being out in cold weather or getting too much sun.  What are the signs or symptoms?  Symptoms of a cold sore go through different stages:  Tingling, itching, or burning is felt 1-2 days before the cold sore appears.  Fluid-filled blisters appear on the lips, inside the mouth, on the nose, or on the cheeks.  The blisters start to ooze clear fluid.  The blisters dry up, and a yellow crust appears in their place.  The crust falls off.  In some cases, other symptoms can develop along with cold sores. These can include:  Fever.  Sore throat.  Headache.  Muscle aches.  Swollen neck glands.  How is this treated?  There is no cure for cold sores or the virus that causes them. There is also no vaccine to prevent the virus. Most cold sores go away on their own without treatment within 2 weeks. Your doctor may prescribe medicines to:  Help with pain.  Keep the virus from growing.  Help you heal faster.  Medicines may be in the form of creams, gels, pills, or a shot.  Follow these instructions at home:  Medicines  Take or apply over-the-counter and prescription medicines only as told by your doctor.  Use a  cotton-tip swab to apply creams or gels to your sores.  Ask your doctor if you can take lysine supplements. These may help with healing.  Sore care    Do not touch the sores or pick the scabs.  Wash your hands often with soap and water for at least 20 seconds. Do not touch your eyes without washing your hands first.  Keep the sores clean and dry.  If told, put ice on the sores. To do this:  Put ice in a plastic bag.  Place a towel between your skin and the bag.  Leave the ice on for 20 minutes, 2-3 times a day.  Take off the ice if your skin turns bright red. This is very important. If you cannot feel pain, heat, or cold, you have a greater risk of damage to the area.  Eating and drinking  Eat a soft, bland diet. Avoid eating hot, cold, or salty foods. These can hurt your mouth.  Use a straw if it hurts to drink out of a glass.  Eat foods that have a lot of lysine in them. These include meat, fish, and dairy products.  Avoid sugary foods, chocolates, nuts, and grains. These foods have a high amount of a substance (arginine) that can cause the virus to grow.  Lifestyle  Do not kiss, have oral sex,  or share personal items until your sores heal.  Stress, poor sleep, and being out in the sun can trigger a cold sore. Make sure you:  Do activities that help you relax, such as deep breathing exercises or meditation.  Get enough sleep.  Put sunscreen on your lips before you go out in the sun.  Contact a doctor if:  You have symptoms for more than 2 weeks.  You have pus coming from the sores.  You have redness that is spreading.  You have pain or irritation in your eye.  You get sores on your genitals.  Your sores do not heal within 2 weeks.  You get cold sores often.  Get help right away if:  You have a fever and your symptoms suddenly get worse.  You have a headache and confusion.  You have tiredness (fatigue).  You do not want to eat as much as normal (loss of appetite).  You have a stiff neck or are sensitive to  light.  Summary  A cold sore is a small, fluid-filled sore that forms inside of the mouth or on the lips, gums, nose, chin, or cheeks.  Cold sores can spread from person to person (are contagious) until the sores crust over completely. Most cold sores go away within 2 weeks.  Wash your hands often. Do not touch your eyes without washing your hands first.  Do not kiss, have oral sex, or share personal items until your sores heal.  Contact a doctor if your sores do not heal within 2 weeks.  This information is not intended to replace advice given to you by your health care provider. Make sure you discuss any questions you have with your health care provider.  Document Revised: 05/31/2021 Document Reviewed: 05/31/2021  Elsevier Patient Education  2024 [employer].

## 2023-08-13 NOTE — Progress Notes (Signed)
Subjective:    Patient ID: Maria Conley, female    DOB: 05-12-1966, 57 y.o.   MRN: [id number]   Chief Complaint: Medical Management of Chronic Issues    HPI:  Maria Conley is a 57 y.o. who identifies as a female who was assigned female at birth.   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 Hyperlipidemia with target LDL less than 100 Does not watch diet and does no exercise Lab Results  Component Value Date   CHOL 169 02/07/2023   HDL 65 02/07/2023   LDLCALC 93 02/07/2023   TRIG 58 02/07/2023   CHOLHDL 2.6 02/07/2023     2. 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 No low blood sugars Lab Results  Component Value Date   HGBA1C 6.0 (H) 02/07/2023     3. Benign hypertensive heart disease without heart failure No c/o chest pain, sob or headache. Doe snot check blood pressure at home. BP Readings from Last 3 Encounters:  08/13/23 134/80  05/21/23 116/75  05/10/23 125/77     4. BMI 31.0-31.9,adult No weight changes Wt Readings from Last 3 Encounters:  08/13/23 201 lb (91.2 kg)  05/21/23 205 lb (93 kg)  05/10/23 204 lb (92.5 kg)   BMI Readings from Last 3 Encounters:  08/13/23 33.45 kg/m  05/21/23 34.11 kg/m  05/10/23 33.95 kg/m      New complaints: None  today  No Known Allergies Outpatient Encounter Medications as of 08/13/2023  Medication Sig   aspirin EC 81 MG tablet Take 81 mg by mouth daily.   Blood Glucose Monitoring Suppl (FREESTYLE LITE) w/Device KIT Use as directed to check blood glucose levels.   Cholecalciferol (VITAMIN D) 50 MCG (2000 UT) tablet Take 2,000 Units by mouth daily.   Continuous Glucose Sensor (FREESTYLE LIBRE 2 SENSOR) MISC Use 1 sensor every 14 days.   cyclobenzaprine (FLEXERIL) 10 MG tablet Take 1 tablet (10 mg total) by  mouth 3 (three) times daily as needed for muscle spasms.   glucose blood (FREESTYLE LITE) test strip Use as directed to test blood sugar 4-5 times daily.   Insulin Infusion Pump Supplies (MINIMED INFUSION SET-MMT 399) MISC Use with insulin pump. Change infusion set q 3 days.   insulin lispro (HUMALOG) 100 UNIT/ML injection Inject 0.7 mLs (70 Units total) into the skin daily.   Lancets (FREESTYLE) lancets Use as directed to test blood sugar 4-5 times daily.   lisinopril-hydrochlorothiazide (ZESTORETIC) 20-12.5 MG tablet Take 1 tablet by mouth daily.   rosuvastatin (CRESTOR) 10 MG tablet Take 1 tablet (10 mg total) by mouth daily.   valACYclovir (VALTREX) 1000 MG tablet 2 po BID for 1 day- repeat at next flare up   No facility-administered encounter medications on file as of 08/13/2023.    Past Surgical History:  Procedure Laterality Date   ABLATION     ASD REPAIR     CESAREAN SECTION     COSMETIC SURGERY     TUBAL LIGATION      Family History  Problem Relation Age of Onset   Heart disease Mother    Hypertension Mother    Diabetes Father    Heart attack Father    Breast cancer Maternal Grandmother       Controlled substance contract: n/a     Review of Systems  Constitutional:  Negative for diaphoresis.  Eyes:  Negative for pain.  Respiratory:  Negative for shortness of breath.   Cardiovascular:  Negative for chest pain, palpitations and leg swelling.  Gastrointestinal:  Negative for abdominal pain.  Endocrine: Negative for polydipsia.  Skin:  Negative for rash.  Neurological:  Negative for dizziness, weakness and headaches.  Hematological:  Does not bruise/bleed easily.  All other systems reviewed and are negative.      Objective:   Physical Exam Vitals and nursing note reviewed.  Constitutional:      General: She is not in acute distress.    Appearance: Normal appearance. She is well-developed.  HENT:     Head: Normocephalic.     Right Ear: Tympanic membrane  normal.     Left Ear: Tympanic membrane normal.     Nose: Nose normal.     Mouth/Throat:     Mouth: Mucous membranes are moist.  Eyes:     Pupils: Pupils are equal, round, and reactive to light.  Neck:     Vascular: No carotid bruit or JVD.  Cardiovascular:     Rate and Rhythm: Normal rate and regular rhythm.     Heart sounds: Normal heart sounds.  Pulmonary:     Effort: Pulmonary effort is normal. No respiratory distress.     Breath sounds: Normal breath sounds. No wheezing or rales.  Chest:     Chest wall: No tenderness.  Abdominal:     General: Bowel sounds are normal. There is no distension or abdominal bruit.     Palpations: Abdomen is soft. There is no hepatomegaly, splenomegaly, mass or pulsatile mass.     Tenderness: There is no abdominal tenderness.  Musculoskeletal:        General: Normal range of motion.     Cervical back: Normal range of motion and neck supple.     Right lower leg: No edema.     Left lower leg: No edema.  Lymphadenopathy:     Cervical: No cervical adenopathy.  Skin:    General: Skin is warm and dry.  Neurological:     Mental Status: She is alert and oriented to person, place, and time.     Deep Tendon Reflexes: Reflexes are normal and symmetric.  Psychiatric:        Behavior: Behavior normal.        Thought Content: Thought content normal.        Judgment: Judgment normal.    BP 134/80   Pulse 89   Temp (!) 97.2 F (36.2 C) (Temporal)   Resp 20   Ht 5\' 5"  (1.651 m)   Wt 201 lb (91.2 kg)   SpO2 99%   BMI 33.45 kg/m    Hgba1c 5.9%     Assessment & Plan:   Maria Conley comes in today with chief complaint of Medical Management of Chronic Issues   Diagnosis and orders addressed:  1. Hyperlipidemia with target LDL less than 100 Low fat diet - Lipid panel - rosuvastatin (CRESTOR) 10 MG tablet; Take 1 tablet (10 mg total) by mouth daily.  Dispense: 90 tablet; Refill: 1  2. Type 1 diabetes mellitus with diabetic neuropathic  arthropathy (HCC) Continue to watch carbs in diet - Bayer DCA Hb A1c Waived - CBC with Differential/Platelet - CMP14+EGFR - Microalbumin / creatinine urine ratio  3. Benign hypertensive heart disease without heart failure Low sodium diet - lisinopril-hydrochlorothiazide (ZESTORETIC) 20-12.5 MG tablet; Take 1 tablet by mouth daily.  Dispense: 90  tablet; Refill: 1  4. BMI 31.0-31.9,adult Discussed diet and exercise for person with BMI >25 Will recheck weight in 3-6 months   5. Type 1 diabetes mellitus without complication (HCC) - insulin lispro (HUMALOG) 100 UNIT/ML injection; Inject 0.7 mLs (70 Units total) into the skin daily.  Dispense: 60 mL; Refill: 5  6. HSV-1 infection  - valACYclovir (VALTREX) 1000 MG tablet; 2 po BID for 1 day- repeat at next flare up  Dispense: 12 tablet; Refill: 1   Labs pending Health Maintenance reviewed Diet and exercise encouraged  Follow up plan: 6 months   Mary-Margaret Daphine Deutscher, FNP

## 2023-08-14 LAB — CBC WITH DIFFERENTIAL/PLATELET
Basophils Absolute: 0.1 10*3/uL (ref 0.0–0.2)
Basos: 2 %
EOS (ABSOLUTE): 0.4 10*3/uL (ref 0.0–0.4)
Eos: 5 %
Hematocrit: 45.2 % (ref 34.0–46.6)
Hemoglobin: 15 g/dL (ref 11.1–15.9)
Immature Grans (Abs): 0 10*3/uL (ref 0.0–0.1)
Immature Granulocytes: 0 %
Lymphocytes Absolute: 2.1 10*3/uL (ref 0.7–3.1)
Lymphs: 30 %
MCH: 29 pg (ref 26.6–33.0)
MCHC: 33.2 g/dL (ref 31.5–35.7)
MCV: 87 fL (ref 79–97)
Monocytes Absolute: 0.6 10*3/uL (ref 0.1–0.9)
Monocytes: 9 %
Neutrophils Absolute: 3.8 10*3/uL (ref 1.4–7.0)
Neutrophils: 54 %
Platelets: 242 10*3/uL (ref 150–450)
RBC: 5.18 x10E6/uL (ref 3.77–5.28)
RDW: 12.6 % (ref 11.7–15.4)
WBC: 7 10*3/uL (ref 3.4–10.8)

## 2023-08-14 LAB — LIPID PANEL
Chol/HDL Ratio: 2.7 {ratio} (ref 0.0–4.4)
Cholesterol, Total: 167 mg/dL (ref 100–199)
HDL: 61 mg/dL (ref >39)
LDL Chol Calc (NIH): 93 mg/dL (ref 0–99)
Triglycerides: 67 mg/dL (ref 0–149)
VLDL Cholesterol Cal: 13 mg/dL (ref 5–40)

## 2023-08-14 LAB — CMP14+EGFR
ALT: 15 [IU]/L (ref 0–32)
AST: 14 [IU]/L (ref 0–40)
Albumin: 4.5 g/dL (ref 3.8–4.9)
Alkaline Phosphatase: 78 [IU]/L (ref 44–121)
BUN/Creatinine Ratio: 13 (ref 9–23)
BUN: 11 mg/dL (ref 6–24)
Bilirubin Total: 0.4 mg/dL (ref 0.0–1.2)
CO2: 27 mmol/L (ref 20–29)
Calcium: 9.6 mg/dL (ref 8.7–10.2)
Chloride: 98 mmol/L (ref 96–106)
Creatinine, Ser: 0.85 mg/dL (ref 0.57–1.00)
Globulin, Total: 2.1 g/dL (ref 1.5–4.5)
Glucose: 95 mg/dL (ref 70–99)
Potassium: 4.4 mmol/L (ref 3.5–5.2)
Sodium: 138 mmol/L (ref 134–144)
Total Protein: 6.6 g/dL (ref 6.0–8.5)
eGFR: 79 mL/min/{1.73_m2} (ref >59)

## 2023-08-14 LAB — MICROALBUMIN / CREATININE URINE RATIO
Creatinine, Urine: 43 mg/dL
Microalb/Creat Ratio: 20 mg/g{creat} (ref 0–29)
Microalbumin, Urine: 8.8 ug/mL

## 2023-08-22 ENCOUNTER — Other Ambulatory Visit: Payer: Self-pay | Admitting: Nurse Practitioner

## 2023-08-22 DIAGNOSIS — Z1231 Encounter for screening mammogram for malignant neoplasm of breast: Secondary | ICD-10-CM

## 2023-08-23 ENCOUNTER — Ambulatory Visit
Admission: RE | Admit: 2023-08-23 | Discharge: 2023-08-23 | Disposition: A | Discharge status: Home / Self Care | Payer: Commercial Managed Care - PPO | Source: Ambulatory Visit | Attending: Nurse Practitioner | Admitting: Nurse Practitioner

## 2023-08-23 DIAGNOSIS — Z1231 Encounter for screening mammogram for malignant neoplasm of breast: Secondary | ICD-10-CM | POA: Diagnosis not present

## 2023-10-19 ENCOUNTER — Other Ambulatory Visit (HOSPITAL_COMMUNITY): Payer: Self-pay

## 2023-10-30 DIAGNOSIS — E109 Type 1 diabetes mellitus without complications: Secondary | ICD-10-CM | POA: Diagnosis not present

## 2023-10-30 DIAGNOSIS — E108 Type 1 diabetes mellitus with unspecified complications: Secondary | ICD-10-CM | POA: Diagnosis not present

## 2023-12-30 ENCOUNTER — Other Ambulatory Visit (HOSPITAL_COMMUNITY): Payer: Self-pay

## 2024-01-08 ENCOUNTER — Other Ambulatory Visit: Payer: Self-pay | Admitting: Nurse Practitioner

## 2024-01-09 ENCOUNTER — Other Ambulatory Visit: Payer: Self-pay

## 2024-01-09 ENCOUNTER — Other Ambulatory Visit (HOSPITAL_COMMUNITY): Payer: Self-pay

## 2024-01-09 MED ORDER — FREESTYLE LIBRE 2 SENSOR MISC
1.0000 | 3 refills | Status: AC
  Filled 2024-01-09 (×3): qty 6, 84d supply, fill #0
  Filled 2024-03-28: qty 6, 84d supply, fill #1
  Filled 2024-06-20: qty 6, 84d supply, fill #2
  Filled 2024-09-08 – 2024-09-09 (×2): qty 6, 84d supply, fill #3

## 2024-01-10 ENCOUNTER — Other Ambulatory Visit (HOSPITAL_COMMUNITY): Payer: Self-pay

## 2024-01-24 ENCOUNTER — Other Ambulatory Visit: Payer: Self-pay

## 2024-01-24 ENCOUNTER — Other Ambulatory Visit: Payer: Self-pay | Admitting: Nurse Practitioner

## 2024-01-24 ENCOUNTER — Other Ambulatory Visit (HOSPITAL_COMMUNITY): Payer: Self-pay

## 2024-01-24 MED ORDER — CYCLOBENZAPRINE HCL 10 MG PO TABS
10.0000 mg | ORAL_TABLET | Freq: Three times a day (TID) | ORAL | 0 refills | Status: DC | PRN
  Filled 2024-01-24: qty 30, 10d supply, fill #0

## 2024-01-28 ENCOUNTER — Other Ambulatory Visit (HOSPITAL_COMMUNITY): Payer: Self-pay

## 2024-01-29 ENCOUNTER — Other Ambulatory Visit (HOSPITAL_COMMUNITY): Payer: Self-pay

## 2024-01-30 ENCOUNTER — Other Ambulatory Visit (HOSPITAL_COMMUNITY): Payer: Self-pay

## 2024-01-30 MED ORDER — AMOXICILLIN-POT CLAVULANATE 500-125 MG PO TABS
500.0000 mg | ORAL_TABLET | Freq: Two times a day (BID) | ORAL | 0 refills | Status: DC
  Filled 2024-01-30: qty 20, 10d supply, fill #0

## 2024-02-03 ENCOUNTER — Other Ambulatory Visit (HOSPITAL_COMMUNITY): Payer: Self-pay

## 2024-02-08 ENCOUNTER — Other Ambulatory Visit (HOSPITAL_COMMUNITY): Payer: Self-pay

## 2024-02-11 ENCOUNTER — Other Ambulatory Visit (HOSPITAL_COMMUNITY): Payer: Self-pay

## 2024-02-11 ENCOUNTER — Encounter: Payer: Self-pay | Admitting: Nurse Practitioner

## 2024-02-11 ENCOUNTER — Ambulatory Visit: Payer: Commercial Managed Care - PPO | Admitting: Nurse Practitioner

## 2024-02-11 VITALS — BP 130/78 | HR 75 | Temp 97.6°F | Ht 65.0 in | Wt 200.0 lb

## 2024-02-11 DIAGNOSIS — E109 Type 1 diabetes mellitus without complications: Secondary | ICD-10-CM | POA: Diagnosis not present

## 2024-02-11 DIAGNOSIS — Z0001 Encounter for general adult medical examination with abnormal findings: Secondary | ICD-10-CM

## 2024-02-11 DIAGNOSIS — Z6831 Body mass index (BMI) 31.0-31.9, adult: Secondary | ICD-10-CM | POA: Diagnosis not present

## 2024-02-11 DIAGNOSIS — I119 Hypertensive heart disease without heart failure: Secondary | ICD-10-CM | POA: Diagnosis not present

## 2024-02-11 DIAGNOSIS — E785 Hyperlipidemia, unspecified: Secondary | ICD-10-CM | POA: Diagnosis not present

## 2024-02-11 DIAGNOSIS — E10610 Type 1 diabetes mellitus with diabetic neuropathic arthropathy: Secondary | ICD-10-CM | POA: Diagnosis not present

## 2024-02-11 LAB — LIPID PANEL

## 2024-02-11 LAB — BAYER DCA HB A1C WAIVED: HB A1C (BAYER DCA - WAIVED): 6.2 % — ABNORMAL HIGH (ref 4.8–5.6)

## 2024-02-11 MED ORDER — LISINOPRIL-HYDROCHLOROTHIAZIDE 20-12.5 MG PO TABS
1.0000 | ORAL_TABLET | Freq: Every day | ORAL | 1 refills | Status: DC
  Filled 2024-02-11 – 2024-03-28 (×2): qty 90, 90d supply, fill #0
  Filled 2024-07-27: qty 90, 90d supply, fill #1

## 2024-02-11 MED ORDER — ROSUVASTATIN CALCIUM 10 MG PO TABS
10.0000 mg | ORAL_TABLET | Freq: Every day | ORAL | 1 refills | Status: DC
  Filled 2024-02-11 – 2024-04-21 (×2): qty 90, 90d supply, fill #0
  Filled 2024-07-27: qty 90, 90d supply, fill #1

## 2024-02-11 MED ORDER — INSULIN LISPRO 100 UNIT/ML IJ SOLN
70.0000 [IU] | Freq: Every day | INTRAMUSCULAR | 5 refills | Status: DC
  Filled 2024-02-11 – 2024-04-21 (×2): qty 60, 85d supply, fill #0
  Filled 2024-07-27: qty 60, 85d supply, fill #1

## 2024-02-11 NOTE — Addendum Note (Signed)
 Addended by: Delfina Feller on: 02/11/2024 12:06 PM   Modules accepted: Level of Service

## 2024-02-11 NOTE — Progress Notes (Addendum)
 Subjective:    Patient ID: Maria Conley, female    DOB: Oct 25, 1965, 58 y.o.   MRN: [id number]   Chief Complaint: annual physical - no pap    HPI:  Maria Conley is a 58 y.o. who identifies as a female who was assigned female at birth.   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 Hyperlipidemia with target LDL less than 100 Does not watch diet and does no exercise Lab Results  Component Value Date   CHOL 167 08/13/2023   HDL 61 08/13/2023   LDLCALC 93 08/13/2023   TRIG 67 08/13/2023   CHOLHDL 2.7 08/13/2023   The 10-year ASCVD risk score (Arnett DK, et al., 2019) is: 6.3%   2. 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 No low blood sugars Lab Results  Component Value Date   HGBA1C 5.9 (H) 08/13/2023     3. Benign hypertensive heart disease without heart failure No c/o chest pain, sob or headache. Does not check blood pressure at home. BP Readings from Last 3 Encounters:  08/13/23 134/80  05/21/23 116/75  05/10/23 125/77     4. BMI 31.0-31.9,adult No weight changes  Wt Readings from Last 3 Encounters:  02/11/24 200 lb (90.7 kg)  08/13/23 201 lb (91.2 kg)  05/21/23 205 lb (93 kg)   BMI Readings from Last 3 Encounters:  02/11/24 33.28 kg/m  08/13/23 33.45 kg/m  05/21/23 34.11 kg/m       New complaints: None  today  No Known Allergies Outpatient Encounter Medications as of 02/11/2024  Medication Sig   amoxicillin -clavulanate (AUGMENTIN ) 500-125 MG tablet Take 1 tablet by mouth every 12 (twelve) hours for 10 days.   aspirin EC 81 MG tablet Take 81 mg by mouth daily.   Blood Glucose Monitoring Suppl (FREESTYLE LITE) w/Device KIT Use as directed to check blood glucose levels.   Cholecalciferol (VITAMIN D ) 50 MCG (2000 UT) tablet Take 2,000 Units by  mouth daily.   Continuous Glucose Sensor (FREESTYLE LIBRE 2 SENSOR) MISC Use 1 sensor every 14 days.   cyclobenzaprine  (FLEXERIL ) 10 MG tablet Take 1 tablet (10 mg total) by mouth 3 (three) times daily as needed for muscle spasms.   glucose blood (FREESTYLE LITE) test strip Use as directed to test blood sugar 4-5 times daily.   Insulin  Infusion Pump Supplies (MINIMED INFUSION SET-MMT 399) MISC Use with insulin  pump. Change infusion set q 3 days.   insulin  lispro (HUMALOG ) 100 UNIT/ML injection Inject 0.7 mLs (70 Units total) into the skin daily.   Lancets (FREESTYLE) lancets Use as directed to test blood sugar 4-5 times daily.   lisinopril -hydrochlorothiazide  (ZESTORETIC ) 20-12.5 MG tablet Take 1 tablet by mouth daily.   rosuvastatin  (CRESTOR ) 10 MG tablet Take 1 tablet (10 mg total) by mouth daily.   No facility-administered encounter medications on file as of 02/11/2024.    Past Surgical History:  Procedure Laterality Date   ABLATION     ASD REPAIR     CESAREAN SECTION     COSMETIC SURGERY     TUBAL LIGATION      Family History  Problem Relation Age of Onset   Heart disease Mother    Hypertension Mother    Diabetes Father    Heart attack Father    Breast cancer Maternal Grandmother  Controlled substance contract: n/a     Review of Systems  Constitutional:  Negative for diaphoresis.  Eyes:  Negative for pain.  Respiratory:  Negative for shortness of breath.   Cardiovascular:  Negative for chest pain, palpitations and leg swelling.  Gastrointestinal:  Negative for abdominal pain.  Endocrine: Negative for polydipsia.  Skin:  Negative for rash.  Neurological:  Negative for dizziness, weakness and headaches.  Hematological:  Does not bruise/bleed easily.  All other systems reviewed and are negative.      Objective:   Physical Exam Vitals and nursing note reviewed.  Constitutional:      General: She is not in acute distress.    Appearance: Normal appearance.  She is well-developed.  HENT:     Head: Normocephalic.     Right Ear: Tympanic membrane normal.     Left Ear: Tympanic membrane normal.     Nose: Nose normal.     Mouth/Throat:     Mouth: Mucous membranes are moist.  Eyes:     Pupils: Pupils are equal, round, and reactive to light.  Neck:     Vascular: No carotid bruit or JVD.  Cardiovascular:     Rate and Rhythm: Normal rate and regular rhythm.     Heart sounds: Normal heart sounds.  Pulmonary:     Effort: Pulmonary effort is normal. No respiratory distress.     Breath sounds: Normal breath sounds. No wheezing or rales.  Chest:     Chest wall: No tenderness.  Abdominal:     General: Bowel sounds are normal. There is no distension or abdominal bruit.     Palpations: Abdomen is soft. There is no hepatomegaly, splenomegaly, mass or pulsatile mass.     Tenderness: There is no abdominal tenderness.  Musculoskeletal:        General: Normal range of motion.     Cervical back: Normal range of motion and neck supple.     Right lower leg: No edema.     Left lower leg: No edema.  Lymphadenopathy:     Cervical: No cervical adenopathy.  Skin:    General: Skin is warm and dry.  Neurological:     Mental Status: She is alert and oriented to person, place, and time.     Deep Tendon Reflexes: Reflexes are normal and symmetric.  Psychiatric:        Behavior: Behavior normal.        Thought Content: Thought content normal.        Judgment: Judgment normal.   BP 130/78   Pulse 75   Temp 97.6 F (36.4 C) (Temporal)   Ht 5\' 5"  (1.651 m)   Wt 200 lb (90.7 kg)   SpO2 97%   BMI 33.28 kg/m     Hgba1c 6.2%     Assessment & Plan:   Maria Conley comes in today with chief complaint of annual physical   Diagnosis and orders addressed:  1. Hyperlipidemia with target LDL less than 100 Low fat diet - Lipid panel - rosuvastatin  (CRESTOR ) 10 MG tablet; Take 1 tablet (10 mg total) by mouth daily.  Dispense: 90 tablet; Refill:  1  2. 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 Benign hypertensive heart disease without heart failure Low sodium diet - lisinopril -hydrochlorothiazide  (ZESTORETIC ) 20-12.5 MG tablet; Take 1 tablet by mouth  daily.  Dispense: 90 tablet; Refill: 1  4. BMI 31.0-31.9,adult Discussed diet and exercise for person with BMI >25 Will recheck weight in 3-6 months   5. Type 1 diabetes mellitus without complication (HCC) - insulin  lispro (HUMALOG ) 100 UNIT/ML injection; Inject 0.7 mLs (70 Units total) into the skin daily.  Dispense: 60 mL; Refill: 5  6. HSV-1 infection  - valACYclovir  (VALTREX ) 1000 MG tablet; 2 po BID for 1 day- repeat at next flare up  Dispense: 12 tablet; Refill: 1   Labs pending Health Maintenance reviewed Diet and exercise encouraged  Follow up plan: 6 months   Mary-Margaret Gaylyn Keas, FNP

## 2024-02-12 LAB — CMP14+EGFR
ALT: 13 IU/L (ref 0–32)
AST: 15 IU/L (ref 0–40)
Albumin: 4.4 g/dL (ref 3.8–4.9)
Alkaline Phosphatase: 72 IU/L (ref 44–121)
BUN/Creatinine Ratio: 16 (ref 9–23)
BUN: 13 mg/dL (ref 6–24)
Bilirubin Total: 0.5 mg/dL (ref 0.0–1.2)
CO2: 23 mmol/L (ref 20–29)
Calcium: 9.6 mg/dL (ref 8.7–10.2)
Chloride: 101 mmol/L (ref 96–106)
Creatinine, Ser: 0.8 mg/dL (ref 0.57–1.00)
Globulin, Total: 2.1 g/dL (ref 1.5–4.5)
Glucose: 72 mg/dL (ref 70–99)
Potassium: 4.2 mmol/L (ref 3.5–5.2)
Sodium: 141 mmol/L (ref 134–144)
Total Protein: 6.5 g/dL (ref 6.0–8.5)
eGFR: 85 mL/min/{1.73_m2} (ref >59)

## 2024-02-12 LAB — CBC WITH DIFFERENTIAL/PLATELET
Basophils Absolute: 0.1 10*3/uL (ref 0.0–0.2)
Basos: 1 %
EOS (ABSOLUTE): 0.3 10*3/uL (ref 0.0–0.4)
Eos: 6 %
Hematocrit: 47.6 % — ABNORMAL HIGH (ref 34.0–46.6)
Hemoglobin: 15.5 g/dL (ref 11.1–15.9)
Immature Grans (Abs): 0 10*3/uL (ref 0.0–0.1)
Immature Granulocytes: 0 %
Lymphocytes Absolute: 1.7 10*3/uL (ref 0.7–3.1)
Lymphs: 30 %
MCH: 28.9 pg (ref 26.6–33.0)
MCHC: 32.6 g/dL (ref 31.5–35.7)
MCV: 89 fL (ref 79–97)
Monocytes Absolute: 0.5 10*3/uL (ref 0.1–0.9)
Monocytes: 8 %
Neutrophils Absolute: 3.2 10*3/uL (ref 1.4–7.0)
Neutrophils: 55 %
Platelets: 224 10*3/uL (ref 150–450)
RBC: 5.36 x10E6/uL — ABNORMAL HIGH (ref 3.77–5.28)
RDW: 12.9 % (ref 11.7–15.4)
WBC: 5.9 10*3/uL (ref 3.4–10.8)

## 2024-02-12 LAB — LIPID PANEL
Cholesterol, Total: 161 mg/dL (ref 100–199)
HDL: 51 mg/dL (ref >39)
LDL CALC COMMENT:: 3.2 ratio (ref 0.0–4.4)
LDL Chol Calc (NIH): 97 mg/dL (ref 0–99)
Triglycerides: 69 mg/dL (ref 0–149)
VLDL Cholesterol Cal: 13 mg/dL (ref 5–40)

## 2024-02-13 ENCOUNTER — Other Ambulatory Visit (HOSPITAL_COMMUNITY): Payer: Self-pay

## 2024-02-13 ENCOUNTER — Ambulatory Visit: Payer: Self-pay | Admitting: Nurse Practitioner

## 2024-03-28 ENCOUNTER — Other Ambulatory Visit (HOSPITAL_COMMUNITY): Payer: Self-pay

## 2024-03-30 DIAGNOSIS — E108 Type 1 diabetes mellitus with unspecified complications: Secondary | ICD-10-CM | POA: Diagnosis not present

## 2024-03-30 DIAGNOSIS — E109 Type 1 diabetes mellitus without complications: Secondary | ICD-10-CM | POA: Diagnosis not present

## 2024-04-21 ENCOUNTER — Other Ambulatory Visit: Payer: Self-pay

## 2024-04-21 ENCOUNTER — Other Ambulatory Visit (HOSPITAL_COMMUNITY): Payer: Self-pay

## 2024-06-08 ENCOUNTER — Telehealth: Payer: Self-pay | Admitting: *Deleted

## 2024-06-08 NOTE — Telephone Encounter (Signed)
 Copied from CRM #[id number]. Topic: Medical Record Request - Other >> Jun 08, 2024  1:31 PM Selinda RAMAN wrote: Reason for CRM: The patient called in stating [employer] sends FMLA paperwork every year because she gets Intermittent FMLA. She has had a tough time getting a hold of [employer] and called to see if paperwork had been received. I spoke with Rilla and she said if it had the patient would have been contacted about a fee to fill it out. I told the patient and she will keep trying to get a hold of [employer]. Please assist patient further.

## 2024-06-08 NOTE — Telephone Encounter (Signed)
 Called Maria Conley, went through dates that paperwork was filled out in the past, that maybe it was not time to renew yet. She looked at her [employer] app and it does say Oct. We wil use the blank forms that I have and start the paperwork and she will come by and pay the form fee.

## 2024-06-10 ENCOUNTER — Telehealth: Payer: Self-pay | Admitting: Nurse Practitioner

## 2024-06-10 DIAGNOSIS — Z0279 Encounter for issue of other medical certificate: Secondary | ICD-10-CM

## 2024-06-10 NOTE — Telephone Encounter (Signed)
 Makylee Visconti dropped off FMLA forms to be completed and signed. (Nurse printed off papers from [employer]/[place] website)  Form Fee Paid? (Y/N)    yes        If NO, form is placed on front office manager desk to hold until payment received. If YES, then form will be placed in the RX/HH Nurse Coordinators box for completion.  Form will not be processed until payment is received   Fax forms & mail pt a copy to her.

## 2024-06-18 NOTE — Telephone Encounter (Signed)
Pt informed see other encounter

## 2024-06-18 NOTE — Telephone Encounter (Signed)
 PCP completed and signed FMLA forms. They have been faxed to [employer] at fax number [phone]. Patient has been contacted and informed they are complete. Copy mailed to patient.

## 2024-06-22 ENCOUNTER — Other Ambulatory Visit (HOSPITAL_COMMUNITY): Payer: Self-pay

## 2024-06-22 ENCOUNTER — Other Ambulatory Visit: Payer: Self-pay

## 2024-08-04 DIAGNOSIS — E109 Type 1 diabetes mellitus without complications: Secondary | ICD-10-CM | POA: Diagnosis not present

## 2024-08-04 DIAGNOSIS — E108 Type 1 diabetes mellitus with unspecified complications: Secondary | ICD-10-CM | POA: Diagnosis not present

## 2024-08-11 ENCOUNTER — Other Ambulatory Visit: Payer: Self-pay | Admitting: Nurse Practitioner

## 2024-08-11 ENCOUNTER — Other Ambulatory Visit (HOSPITAL_COMMUNITY): Payer: Self-pay

## 2024-08-11 ENCOUNTER — Ambulatory Visit: Payer: Self-pay | Admitting: Nurse Practitioner

## 2024-08-11 ENCOUNTER — Encounter: Payer: Self-pay | Admitting: Nurse Practitioner

## 2024-08-11 VITALS — BP 132/83 | HR 78 | Temp 97.6°F | Ht 65.0 in | Wt 194.0 lb

## 2024-08-11 DIAGNOSIS — E785 Hyperlipidemia, unspecified: Secondary | ICD-10-CM

## 2024-08-11 DIAGNOSIS — E109 Type 1 diabetes mellitus without complications: Secondary | ICD-10-CM

## 2024-08-11 DIAGNOSIS — I119 Hypertensive heart disease without heart failure: Secondary | ICD-10-CM | POA: Diagnosis not present

## 2024-08-11 DIAGNOSIS — Z6831 Body mass index (BMI) 31.0-31.9, adult: Secondary | ICD-10-CM

## 2024-08-11 DIAGNOSIS — E10610 Type 1 diabetes mellitus with diabetic neuropathic arthropathy: Secondary | ICD-10-CM | POA: Diagnosis not present

## 2024-08-11 LAB — BAYER DCA HB A1C WAIVED: HB A1C (BAYER DCA - WAIVED): 5.9 % — ABNORMAL HIGH (ref 4.8–5.6)

## 2024-08-11 LAB — LIPID PANEL

## 2024-08-11 MED ORDER — PREDNISONE 20 MG PO TABS
40.0000 mg | ORAL_TABLET | Freq: Every day | ORAL | 0 refills | Status: AC
  Filled 2024-08-11: qty 10, 5d supply, fill #0

## 2024-08-11 MED ORDER — LISINOPRIL-HYDROCHLOROTHIAZIDE 20-12.5 MG PO TABS
1.0000 | ORAL_TABLET | Freq: Every day | ORAL | 1 refills | Status: AC
  Filled 2024-08-11: qty 90, 90d supply, fill #0

## 2024-08-11 MED ORDER — CYCLOBENZAPRINE HCL 10 MG PO TABS
10.0000 mg | ORAL_TABLET | Freq: Three times a day (TID) | ORAL | 2 refills | Status: AC | PRN
  Filled 2024-08-11: qty 30, 10d supply, fill #0

## 2024-08-11 MED ORDER — ROSUVASTATIN CALCIUM 10 MG PO TABS
10.0000 mg | ORAL_TABLET | Freq: Every day | ORAL | 1 refills | Status: AC
  Filled 2024-08-11: qty 90, 90d supply, fill #0

## 2024-08-11 MED ORDER — INSULIN LISPRO 100 UNIT/ML IJ SOLN
70.0000 [IU] | Freq: Every day | INTRAMUSCULAR | 5 refills | Status: AC
  Filled 2024-08-11: qty 60, 85d supply, fill #0

## 2024-08-11 NOTE — Progress Notes (Signed)
 Subjective:    Patient ID: Maria Conley, female    DOB: 1965-11-19, 58 y.o.   MRN: [id number]   Chief Complaint: annual physical - no pap    HPI:  Maria Conley is a 58 y.o. who identifies as a female who was assigned female at birth.   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 Hyperlipidemia with target LDL less than 100 Does not watch diet and does no exercise Lab Results  Component Value Date   CHOL 161 02/11/2024   HDL 51 02/11/2024   LDLCALC 97 02/11/2024   TRIG 69 02/11/2024   CHOLHDL 3.2 02/11/2024   The 10-year ASCVD risk score (Arnett DK, et al., 2019) is: 7.5%   2. 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 No low blood sugars Lab Results  Component Value Date   HGBA1C 6.2 (H) 02/11/2024     3. Benign hypertensive heart disease without heart failure No c/o chest pain, sob or headache. Does not check blood pressure at home. BP Readings from Last 3 Encounters:  02/11/24 130/78  08/13/23 134/80  05/21/23 116/75     4. BMI 31.0-31.9,adult Weight is down 6lbs  Wt Readings from Last 3 Encounters:  08/11/24 194 lb (88 kg)  02/11/24 200 lb (90.7 kg)  08/13/23 201 lb (91.2 kg)   BMI Readings from Last 3 Encounters:  08/11/24 32.28 kg/m  02/11/24 33.28 kg/m  08/13/23 33.45 kg/m    5. HSV No recent flare ups Has valtrex  available     New complaints: None  today  No Known Allergies Outpatient Encounter Medications as of 08/11/2024  Medication Sig   aspirin EC 81 MG tablet Take 81 mg by mouth daily.   Blood Glucose Monitoring Suppl (FREESTYLE LITE) w/Device KIT Use as directed to check blood glucose levels.   Cholecalciferol (VITAMIN D ) 50 MCG (2000 UT) tablet Take 2,000 Units by mouth daily.   Continuous Glucose Sensor (FREESTYLE LIBRE 2  SENSOR) MISC Use 1 sensor every 14 days.   cyclobenzaprine  (FLEXERIL ) 10 MG tablet Take 1 tablet (10 mg total) by mouth 3 (three) times daily as needed for muscle spasms.   glucose blood (FREESTYLE LITE) test strip Use as directed to test blood sugar 4-5 times daily.   Insulin  Infusion Pump Supplies (MINIMED INFUSION SET-MMT 399) MISC Use with insulin  pump. Change infusion set q 3 days.   insulin  lispro (HUMALOG ) 100 UNIT/ML injection Inject 70 units into the skin daily.   Lancets (FREESTYLE) lancets Use as directed to test blood sugar 4-5 times daily.   lisinopril -hydrochlorothiazide  (ZESTORETIC ) 20-12.5 MG tablet Take 1 tablet by mouth daily.   rosuvastatin  (CRESTOR ) 10 MG tablet Take 1 tablet (10 mg total) by mouth daily.   No facility-administered encounter medications on file as of 08/11/2024.    Past Surgical History:  Procedure Laterality Date   ABLATION     ASD REPAIR     CESAREAN SECTION     COSMETIC SURGERY     TUBAL LIGATION      Family History  Problem Relation Age of Onset   Heart disease Mother    Hypertension Mother    Diabetes Father    Heart attack Father    Breast cancer Maternal Grandmother       Controlled substance contract: n/a  Review of Systems  Constitutional:  Negative for diaphoresis.  Eyes:  Negative for pain.  Respiratory:  Negative for shortness of breath.   Cardiovascular:  Negative for chest pain, palpitations and leg swelling.  Gastrointestinal:  Negative for abdominal pain.  Endocrine: Negative for polydipsia.  Skin:  Negative for rash.  Neurological:  Negative for dizziness, weakness and headaches.  Hematological:  Does not bruise/bleed easily.  All other systems reviewed and are negative.      Objective:   Physical Exam Vitals and nursing note reviewed.  Constitutional:      General: She is not in acute distress.    Appearance: Normal appearance. She is well-developed.  HENT:     Head: Normocephalic.     Right Ear:  Tympanic membrane normal.     Left Ear: Tympanic membrane normal.     Nose: Nose normal.     Mouth/Throat:     Mouth: Mucous membranes are moist.  Eyes:     Pupils: Pupils are equal, round, and reactive to light.  Neck:     Vascular: No carotid bruit or JVD.  Cardiovascular:     Rate and Rhythm: Normal rate and regular rhythm.     Heart sounds: Normal heart sounds.  Pulmonary:     Effort: Pulmonary effort is normal. No respiratory distress.     Breath sounds: Normal breath sounds. No wheezing or rales.  Chest:     Chest wall: No tenderness.  Abdominal:     General: Bowel sounds are normal. There is no distension or abdominal bruit.     Palpations: Abdomen is soft. There is no hepatomegaly, splenomegaly, mass or pulsatile mass.     Tenderness: There is no abdominal tenderness.  Musculoskeletal:        General: Normal range of motion.     Cervical back: Normal range of motion and neck supple.     Right lower leg: No edema.     Left lower leg: No edema.  Lymphadenopathy:     Cervical: No cervical adenopathy.  Skin:    General: Skin is warm and dry.  Neurological:     Mental Status: She is alert and oriented to person, place, and time.     Deep Tendon Reflexes: Reflexes are normal and symmetric.  Psychiatric:        Behavior: Behavior normal.        Thought Content: Thought content normal.        Judgment: Judgment normal.    BP 132/83   Pulse 78   Temp 97.6 F (36.4 C) (Temporal)   Ht 5' 5 (1.651 m)   Wt 194 lb (88 kg)   SpO2 99%   BMI 32.28 kg/m    Hgba1c 5.9%     Assessment & Plan:   Maria Conley comes in today with chief complaint of annual physical   Diagnosis and orders addressed:  1. Hyperlipidemia with target LDL less than 100 Low fat diet - Lipid panel - rosuvastatin  (CRESTOR ) 10 MG tablet; Take 1 tablet (10 mg total) by mouth daily.  Dispense: 90 tablet; Refill: 1  2. Type 1 diabetes mellitus with diabetic neuropathic arthropathy  (HCC) Continue to watch carbs in diet- insulin  lispro (HUMALOG ) 100 UNIT/ML injection; Inject 0.7 mLs (70 Units total) into the skin daily.  Dispense: 60 mL; Refill: 5  - Bayer DCA Hb A1c Waived - CBC with Differential/Platelet - CMP14+EGFR - Microalbumin / creatinine urine ratio  3. Benign hypertensive heart disease without heart  failure Low sodium diet - lisinopril -hydrochlorothiazide  (ZESTORETIC ) 20-12.5 MG tablet; Take 1 tablet by mouth daily.  Dispense: 90 tablet; Refill: 1  4. BMI 31.0-31.9,adult Discussed diet and exercise for person with BMI >25 Will recheck weight in 3-6 months  5. HSV-1 infection  - valACYclovir  (VALTREX ) 1000 MG tablet; 2 po BID for 1 day- repeat at next flare up  Dispense: 12 tablet; Refill: 1   Labs pending Health Maintenance reviewed Diet and exercise encouraged  Follow up plan: 6 months   Mary-Margaret Gladis, FNP

## 2024-08-11 NOTE — Patient Instructions (Signed)
Cold Sore  A cold sore, also called a fever blister, is a small, fluid-filled sore that forms inside the mouth or on the lips, gums, nose, chin, or cheeks. Cold sores can spread to other parts of the body, such as the eyes, fingers, or genitals. Cold sores can spread from person to person (are contagious) until the sores crust over completely. Most cold sores go away within 2 weeks. What are the causes? Cold sores are caused by an infection from a common type of herpes simplex virus (HSV-1). HSV-1 is closely related to the HSV-2virus, which is the virus that causes genital herpes, but these viruses are not the same. Once a person is infected with HSV-1, the virus remains permanently in the body. HSV-1 is spread from person to person through close contact, such as through kissing, touching the affected area, or sharing personal items such as lip balm, razors, a drinking glass, or eating utensils. What increases the risk? You are more likely to develop this condition if you: Are tired, stressed, or sick. Are menstruating. Are pregnant. Take certain medicines. Are exposed to cold weather or too much sun. What are the signs or symptoms? Symptoms of a cold sore outbreak go through different stages. These are the stages of a cold sore: Tingling, itching, or burning is felt 1-2 days before the outbreak. Fluid-filled blisters appear on the lips, inside the mouth, on the nose, or on the cheeks. The blisters start to ooze clear fluid. The blisters dry up, and a yellow crust appears in their place. The crust falls off. In some cases, other symptoms can develop during a cold sore outbreak. These can include: Fever. Sore throat. Headache. Muscle aches. Swollen neck glands. How is this diagnosed? This condition is diagnosed based on your medical history and a physical exam. Your health care provider may do a blood test or may swab some fluid from your sore and then examine the swab in the lab. How is  this treated? There is no cure for cold sores or HSV-1. There is also no vaccine for HSV-1. Most cold sores go away on their own without treatment within 2 weeks. Medicines cannot make the infection go away, but your health care provider may prescribe medicines to: Help relieve some of the pain associated with the sores. Work to stop the virus from multiplying. Shorten healing time. Medicines may be in the form of creams, gels, pills, or a shot. Follow these instructions at home: Medicines Take or apply over-the-counter and prescription medicines only as told by your health care provider. Use a cotton-tip swab to apply creams or gels to your sores. Ask your health care provider if you can take lysine supplements. Research has found that lysine may help heal the cold sore faster and prevent outbreaks. Sore care  Do not touch the sores or pick the scabs. Wash your hands often with soap and water for at least 20 seconds. Do not touch your eyes without washing your hands first. Keep the sores clean and dry. If directed, put ice on the sores. To do this: Put ice in a plastic bag. Place a towel between your skin and the bag. Leave the ice on for 20 minutes, 2-3 times a day. Remove the ice if your skin turns bright red. This is very important. If you cannot feel pain, heat, or cold, you have a greater risk of damage to the area. Eating and drinking Eat a soft, bland diet. Avoid eating hot, cold, or salty foods.  Use a straw if it hurts to drink out of a glass. Eat foods that are rich in lysine, such as meat, fish, and dairy products. Avoid sugary foods, chocolates, nuts, and grains. These foods are rich in a nutrient called arginine, which can cause the virus to multiply. Lifestyle Do not kiss, have oral sex, or share personal items until your sores heal. Stress, poor sleep, and being out in the sun can trigger outbreaks. Make sure you: Do activities that help you relax, such as deep breathing  exercises or meditation. Get enough sleep. Apply sunscreen on your lips before you go out in the sun. Contact a health care provider if: You have symptoms for more than 2 weeks. You have pus coming from the sores. You have redness that is spreading. You have pain or irritation in your eye. You get sores on your genitals. Your sores do not heal within 2 weeks. You have frequent cold sore outbreaks. Get help right away if: You have a fever and your symptoms suddenly get worse. You have a headache and confusion. You have tiredness (fatigue) or loss of appetite. You have a stiff neck or sensitivity to light. Summary A cold sore, also called a fever blister, is a small, fluid-filled sore that forms inside the mouth or on the lips, gums, nose, chin, or cheeks. Most cold sores go away on their own without treatment within 2 weeks. Your health care provider may prescribe medicines to help relieve some of the pain, work to stop the virus from multiplying, and shorten healing time. Wash your hands often with soap and water for at least 20 seconds. Do not touch your eyes without washing your hands first. Do not kiss, have oral sex, or share personal items until your sores heal. Contact a health care provider if your sores do not heal within 2 weeks. This information is not intended to replace advice given to you by your health care provider. Make sure you discuss any questions you have with your health care provider. Document Revised: 05/31/2021 Document Reviewed: 05/31/2021 [employer]vier Patient Education  2024 [employer].

## 2024-08-11 NOTE — Progress Notes (Signed)
 Meds ordered this encounter  Medications   cyclobenzaprine  (FLEXERIL ) 10 MG tablet    Sig: Take 1 tablet (10 mg total) by mouth 3 (three) times daily as needed for muscle spasms.    Dispense:  30 tablet    Refill:  2    Supervising Provider:   MARYANNE CHEW A [[id number]]   Mary-Margaret Gladis, FNP

## 2024-08-12 LAB — CMP14+EGFR
ALT: 23 IU/L (ref 0–32)
AST: 22 IU/L (ref 0–40)
Albumin: 4.4 g/dL (ref 3.8–4.9)
Alkaline Phosphatase: 70 IU/L (ref 49–135)
BUN/Creatinine Ratio: 16 (ref 12–28)
BUN: 14 mg/dL (ref 8–27)
Bilirubin Total: 0.5 mg/dL (ref 0.0–1.2)
CO2: 25 mmol/L (ref 20–29)
Calcium: 9.4 mg/dL (ref 8.7–10.3)
Chloride: 100 mmol/L (ref 96–106)
Creatinine, Ser: 0.89 mg/dL (ref 0.57–1.00)
Globulin, Total: 2 g/dL (ref 1.5–4.5)
Glucose: 121 mg/dL — AB (ref 70–99)
Potassium: 4.1 mmol/L (ref 3.5–5.2)
Sodium: 140 mmol/L (ref 134–144)
Total Protein: 6.4 g/dL (ref 6.0–8.5)
eGFR: 74 mL/min/1.73 (ref >59)

## 2024-08-12 LAB — CBC WITH DIFFERENTIAL/PLATELET
Basophils Absolute: 0.1 x10E3/uL (ref 0.0–0.2)
Basos: 1 %
EOS (ABSOLUTE): 0.4 x10E3/uL (ref 0.0–0.4)
Eos: 6 %
Hematocrit: 46.1 % (ref 34.0–46.6)
Hemoglobin: 15.2 g/dL (ref 11.1–15.9)
Immature Grans (Abs): 0 x10E3/uL (ref 0.0–0.1)
Immature Granulocytes: 0 %
Lymphocytes Absolute: 2 x10E3/uL (ref 0.7–3.1)
Lymphs: 26 %
MCH: 29.1 pg (ref 26.6–33.0)
MCHC: 33 g/dL (ref 31.5–35.7)
MCV: 88 fL (ref 79–97)
Monocytes Absolute: 0.6 x10E3/uL (ref 0.1–0.9)
Monocytes: 7 %
Neutrophils Absolute: 4.5 x10E3/uL (ref 1.4–7.0)
Neutrophils: 60 %
Platelets: 261 x10E3/uL (ref 150–450)
RBC: 5.23 x10E6/uL (ref 3.77–5.28)
RDW: 12.5 % (ref 11.7–15.4)
WBC: 7.6 x10E3/uL (ref 3.4–10.8)

## 2024-08-12 LAB — LIPID PANEL
Cholesterol, Total: 170 mg/dL (ref 100–199)
HDL: 64 mg/dL (ref >39)
LDL CALC COMMENT:: 2.7 ratio (ref 0.0–4.4)
LDL Chol Calc (NIH): 94 mg/dL (ref 0–99)
Triglycerides: 64 mg/dL (ref 0–149)
VLDL Cholesterol Cal: 12 mg/dL (ref 5–40)

## 2024-08-12 LAB — MICROALBUMIN / CREATININE URINE RATIO
Creatinine, Urine: 93.7 mg/dL
Microalb/Creat Ratio: <3 mg/g{creat} (ref 0–29)
Microalbumin, Urine: <3 ug/mL

## 2024-08-13 ENCOUNTER — Ambulatory Visit: Payer: Self-pay | Admitting: Nurse Practitioner

## 2024-08-19 ENCOUNTER — Telehealth: Payer: Self-pay

## 2024-08-19 NOTE — Telephone Encounter (Signed)
 Paperwork rcvd & placed on PCP's desk

## 2024-08-19 NOTE — Telephone Encounter (Signed)
 Copied from CRM #[id number]. Topic: Clinical - Order For Equipment >> Aug 19, 2024 11:32 AM Gustabo BIRCH wrote: Philippe with [employer]  pump replacement order prescription paperwork that was sent over to the office needs to be signed and completed. Please be on the lookout for the fax

## 2024-08-20 NOTE — Telephone Encounter (Signed)
Noted  -LS

## 2024-08-31 DIAGNOSIS — E108 Type 1 diabetes mellitus with unspecified complications: Secondary | ICD-10-CM | POA: Diagnosis not present

## 2024-08-31 DIAGNOSIS — E109 Type 1 diabetes mellitus without complications: Secondary | ICD-10-CM | POA: Diagnosis not present

## 2024-09-08 ENCOUNTER — Other Ambulatory Visit (HOSPITAL_COMMUNITY): Payer: Self-pay

## 2024-09-08 ENCOUNTER — Encounter (HOSPITAL_COMMUNITY): Payer: Self-pay

## 2024-09-09 ENCOUNTER — Other Ambulatory Visit (HOSPITAL_COMMUNITY): Payer: Self-pay

## 2025-02-09 ENCOUNTER — Ambulatory Visit: Admitting: Nurse Practitioner
